# Patient Record
Sex: Female | Born: 1965 | Race: White | Hispanic: No | Marital: Married | Smoking: Former smoker
Health system: Southern US, Community
[De-identification: ages and names within clinical notes are randomized; demographics above are authoritative.]

## PROBLEM LIST (undated history)

## (undated) DIAGNOSIS — D649 Anemia, unspecified: Secondary | ICD-10-CM

## (undated) DIAGNOSIS — M549 Dorsalgia, unspecified: Secondary | ICD-10-CM

## (undated) DIAGNOSIS — F419 Anxiety disorder, unspecified: Secondary | ICD-10-CM

## (undated) DIAGNOSIS — R112 Nausea with vomiting, unspecified: Secondary | ICD-10-CM

## (undated) DIAGNOSIS — E049 Nontoxic goiter, unspecified: Secondary | ICD-10-CM

## (undated) DIAGNOSIS — E785 Hyperlipidemia, unspecified: Secondary | ICD-10-CM

## (undated) DIAGNOSIS — Z8632 Personal history of gestational diabetes: Secondary | ICD-10-CM

## (undated) DIAGNOSIS — E282 Polycystic ovarian syndrome: Secondary | ICD-10-CM

## (undated) DIAGNOSIS — C50919 Malignant neoplasm of unspecified site of unspecified female breast: Secondary | ICD-10-CM

## (undated) DIAGNOSIS — K219 Gastro-esophageal reflux disease without esophagitis: Secondary | ICD-10-CM

## (undated) DIAGNOSIS — Z9889 Other specified postprocedural states: Secondary | ICD-10-CM

## (undated) DIAGNOSIS — I4711 Inappropriate sinus tachycardia, so stated: Secondary | ICD-10-CM

## (undated) DIAGNOSIS — M61479 Other calcification of muscle, unspecified ankle and foot: Secondary | ICD-10-CM

## (undated) DIAGNOSIS — K900 Celiac disease: Secondary | ICD-10-CM

## (undated) DIAGNOSIS — C4490 Unspecified malignant neoplasm of skin, unspecified: Secondary | ICD-10-CM

## (undated) DIAGNOSIS — K209 Esophagitis, unspecified without bleeding: Secondary | ICD-10-CM

## (undated) DIAGNOSIS — R232 Flushing: Secondary | ICD-10-CM

## (undated) DIAGNOSIS — R0602 Shortness of breath: Secondary | ICD-10-CM

## (undated) DIAGNOSIS — M47819 Spondylosis without myelopathy or radiculopathy, site unspecified: Secondary | ICD-10-CM

## (undated) DIAGNOSIS — E538 Deficiency of other specified B group vitamins: Secondary | ICD-10-CM

## (undated) DIAGNOSIS — E559 Vitamin D deficiency, unspecified: Secondary | ICD-10-CM

## (undated) DIAGNOSIS — C801 Malignant (primary) neoplasm, unspecified: Secondary | ICD-10-CM

## (undated) DIAGNOSIS — R000 Tachycardia, unspecified: Secondary | ICD-10-CM

## (undated) DIAGNOSIS — T8859XA Other complications of anesthesia, initial encounter: Secondary | ICD-10-CM

## (undated) DIAGNOSIS — N970 Female infertility associated with anovulation: Secondary | ICD-10-CM

## (undated) DIAGNOSIS — Z148 Genetic carrier of other disease: Secondary | ICD-10-CM

## (undated) DIAGNOSIS — M2550 Pain in unspecified joint: Secondary | ICD-10-CM

## (undated) DIAGNOSIS — R1310 Dysphagia, unspecified: Secondary | ICD-10-CM

## (undated) DIAGNOSIS — I2510 Atherosclerotic heart disease of native coronary artery without angina pectoris: Secondary | ICD-10-CM

## (undated) DIAGNOSIS — R5383 Other fatigue: Secondary | ICD-10-CM

## (undated) DIAGNOSIS — T4145XA Adverse effect of unspecified anesthetic, initial encounter: Secondary | ICD-10-CM

## (undated) HISTORY — DX: Other calcification of muscle, unspecified ankle and foot: M61.479

## (undated) HISTORY — DX: Malignant neoplasm of unspecified site of unspecified female breast: C50.919

## (undated) HISTORY — DX: Other fatigue: R53.83

## (undated) HISTORY — DX: Genetic carrier of other disease: Z14.8

## (undated) HISTORY — DX: Vitamin D deficiency, unspecified: E55.9

## (undated) HISTORY — DX: Personal history of gestational diabetes: Z86.32

## (undated) HISTORY — DX: Esophagitis, unspecified without bleeding: K20.90

## (undated) HISTORY — DX: Gastro-esophageal reflux disease without esophagitis: K21.9

## (undated) HISTORY — DX: Malignant (primary) neoplasm, unspecified: C80.1

## (undated) HISTORY — DX: Hyperlipidemia, unspecified: E78.5

## (undated) HISTORY — DX: Nontoxic goiter, unspecified: E04.9

## (undated) HISTORY — DX: Unspecified malignant neoplasm of skin, unspecified: C44.90

## (undated) HISTORY — DX: Inappropriate sinus tachycardia, so stated: I47.11

## (undated) HISTORY — DX: Dorsalgia, unspecified: M54.9

## (undated) HISTORY — DX: Flushing: R23.2

## (undated) HISTORY — DX: Anemia, unspecified: D64.9

## (undated) HISTORY — DX: Anxiety disorder, unspecified: F41.9

## (undated) HISTORY — DX: Celiac disease: K90.0

## (undated) HISTORY — PX: MASTECTOMY: SHX3

## (undated) HISTORY — DX: Dysphagia, unspecified: R13.10

## (undated) HISTORY — DX: Polycystic ovarian syndrome: E28.2

## (undated) HISTORY — DX: Female infertility associated with anovulation: N97.0

## (undated) HISTORY — DX: Deficiency of other specified B group vitamins: E53.8

## (undated) HISTORY — DX: Pain in unspecified joint: M25.50

## (undated) HISTORY — DX: Atherosclerotic heart disease of native coronary artery without angina pectoris: I25.10

## (undated) HISTORY — DX: Shortness of breath: R06.02

## (undated) HISTORY — DX: Tachycardia, unspecified: R00.0

## (undated) HISTORY — PX: ABDOMINAL HYSTERECTOMY: SHX81

---

## 2006-11-03 ENCOUNTER — Encounter: Admission: RE | Admit: 2006-11-03 | Discharge: 2006-11-03 | Payer: Self-pay | Admitting: Endocrinology

## 2007-10-19 ENCOUNTER — Encounter: Admission: RE | Admit: 2007-10-19 | Discharge: 2007-10-19 | Payer: Self-pay | Admitting: Obstetrics and Gynecology

## 2008-11-27 ENCOUNTER — Encounter: Admission: RE | Admit: 2008-11-27 | Discharge: 2008-11-27 | Payer: Self-pay | Admitting: Obstetrics and Gynecology

## 2008-12-19 ENCOUNTER — Encounter: Admission: RE | Admit: 2008-12-19 | Discharge: 2008-12-19 | Payer: Self-pay | Admitting: Endocrinology

## 2008-12-19 IMAGING — US US SOFT TISSUE HEAD/NECK
1 series · 14 of 25 positions shown · non-contrast
Comparison: Thyroid ultrasound [DATE].

CLINICAL DATA: Follow-up goiter.

THYROID ULTRASOUND
TECHNIQUE: Ultrasound examination of the thyroid gland and
adjacent soft tissues was performed.

[Series 1: us soft tissue head/neck · 0.07mm/px · 14 of 58 slices shown]
[im 1/58]
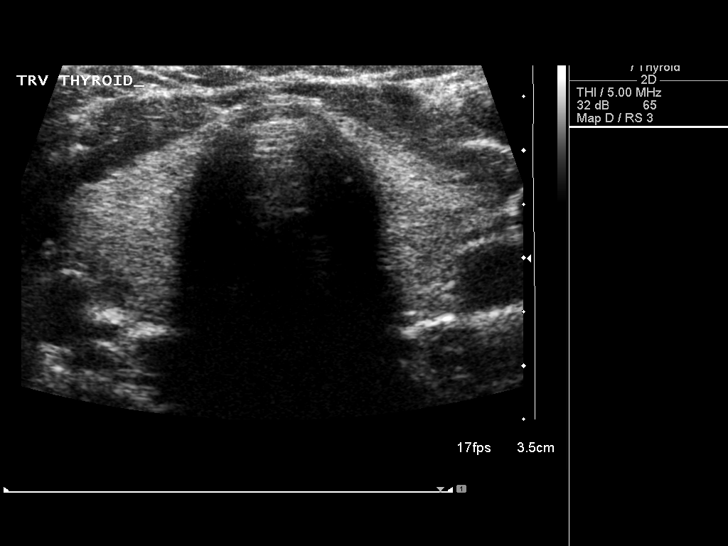
[im 5/58]
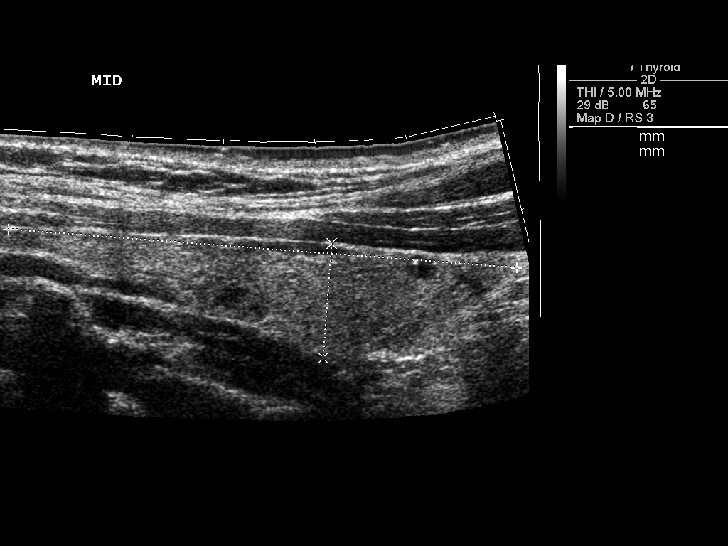
[im 10/58]
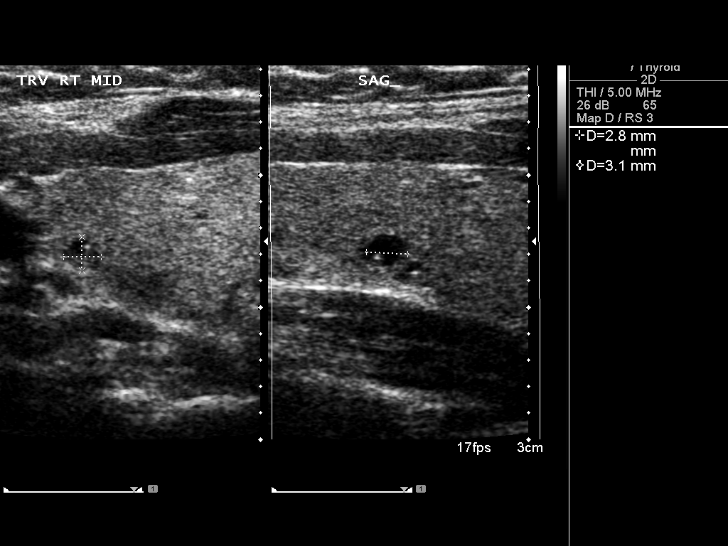
[im 15/58]
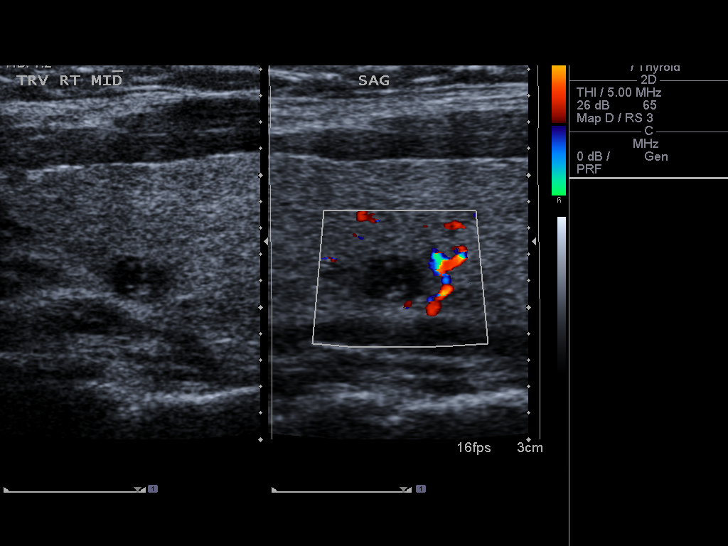
[im 20/58]
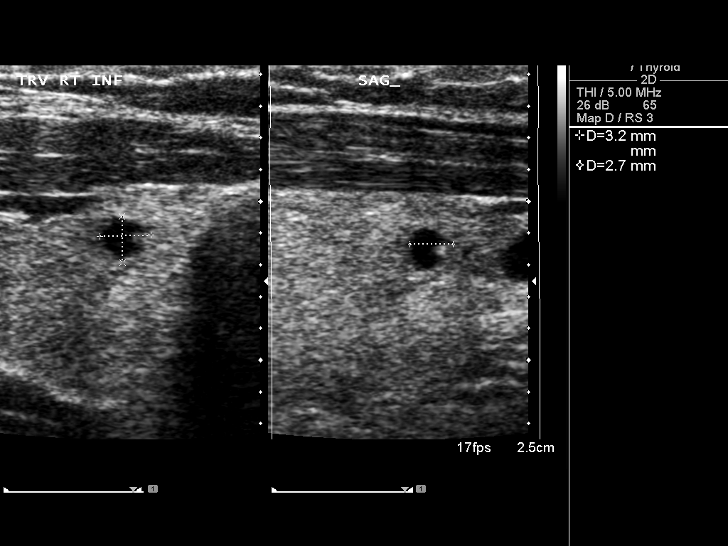
[im 22/58]
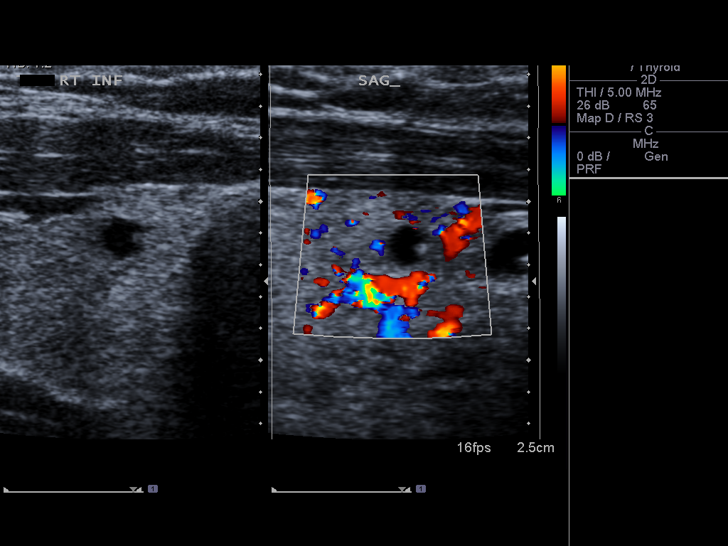
[im 27/58]
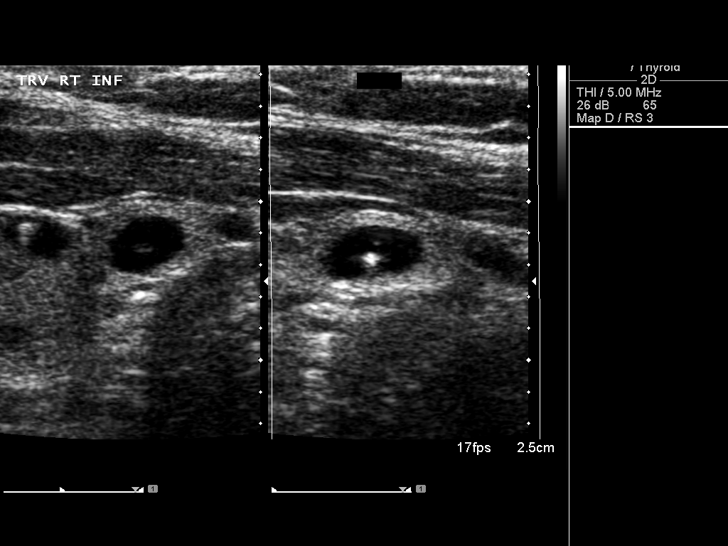
[im 31/58]
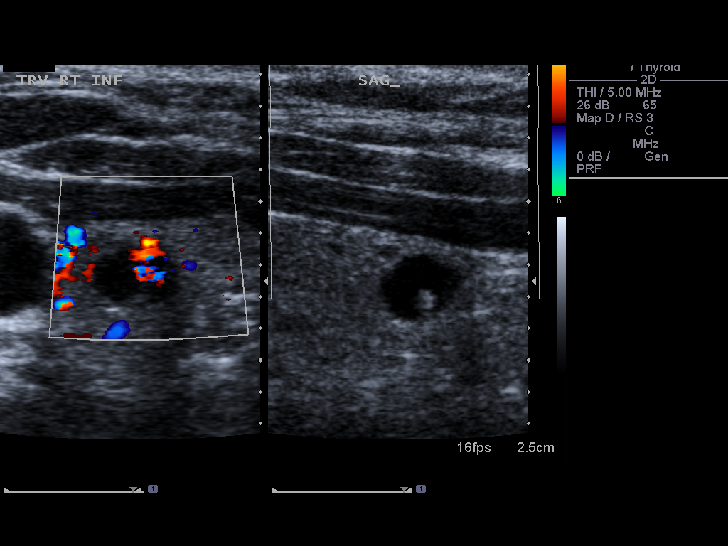
[im 36/58]
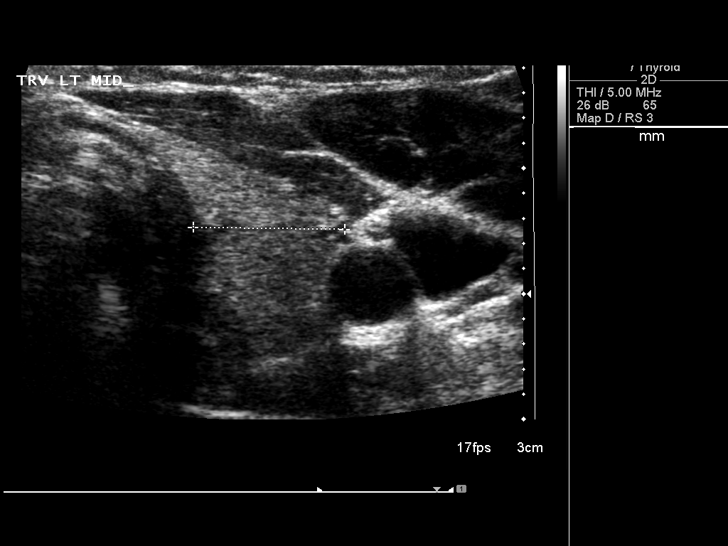
[im 39/58]
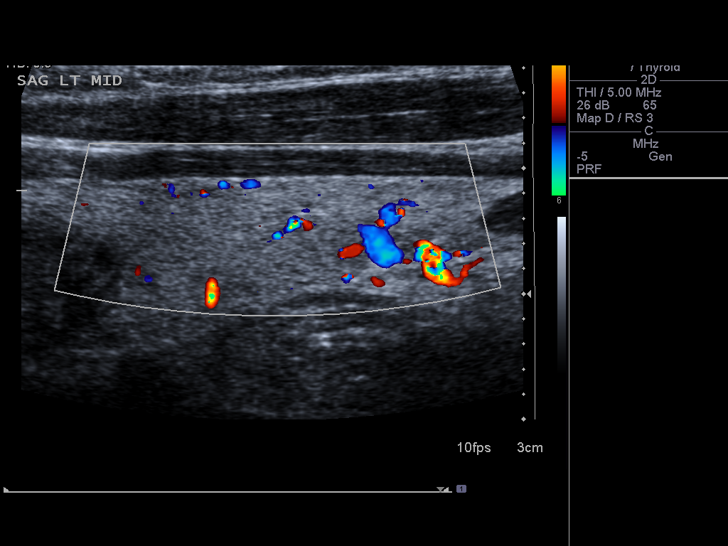
[im 43/58]
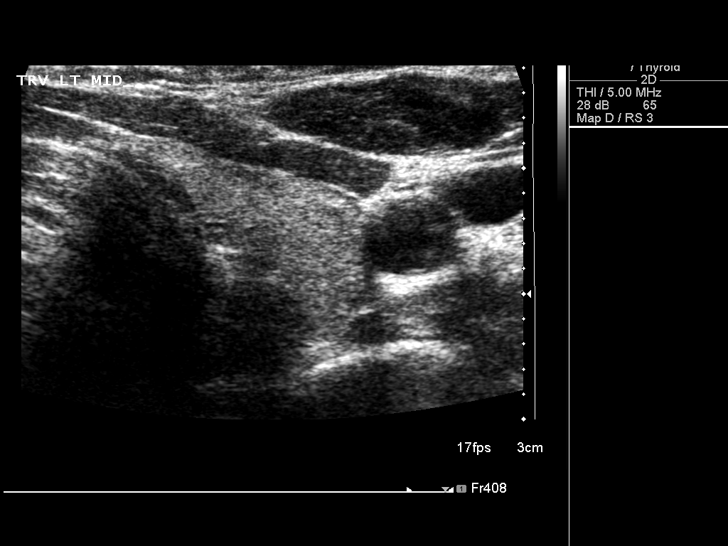
[im 48/58]
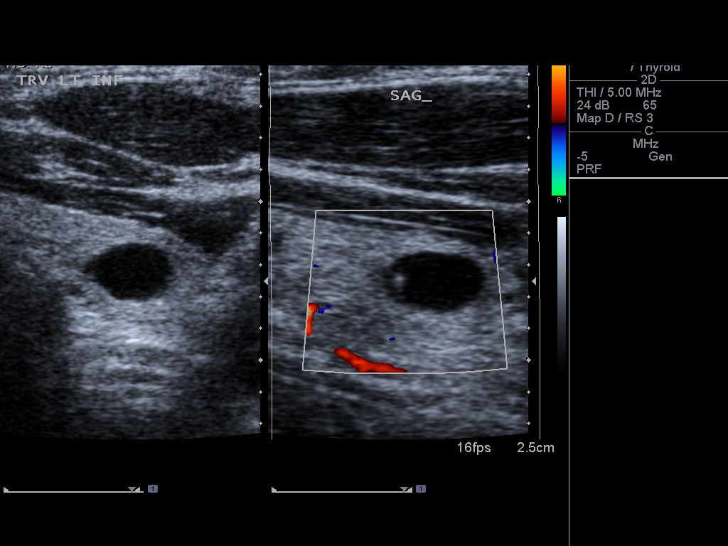
[im 53/58]
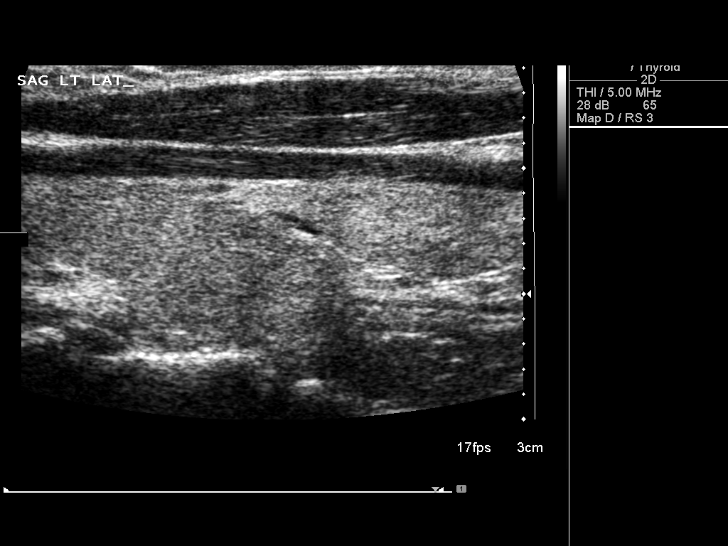
[im 58/58]
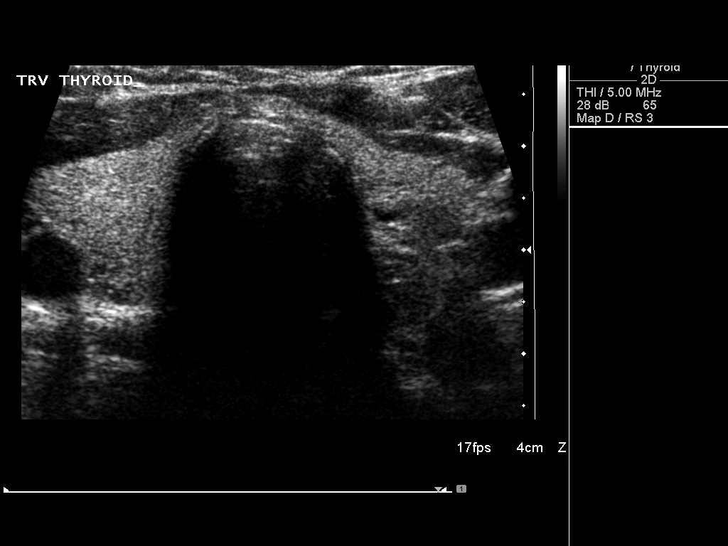

[14 of 25 positions shown; findings below may reference images not displayed]

FINDINGS: The thyroid echotexture is fairly homogeneous
bilaterally.  Overall dimensions of the right lobe are 5.6 x 1.3 x
1.5 cm, and the left lobe 5.2 x 1.2 x 1.2 cm.  The isthmus measures
2.6 mm in thickness.

There are innumerable primarily cystic lesions bilaterally.  Some
of these are minimally complex, but no solid lesions are
identified.  The largest lesion on the right is located inferiorly
and measures 6 mm in maximal diameter.  The largest lesion on the
left is located inferiorly and measures 7 mm in maximal diameter.
IMPRESSION: Generally stable primarily cystic lesions in both thyroid lobes.
No suspicious solid nodules are identified.  Overall gland size is
similar to the prior study.

## 2010-09-07 ENCOUNTER — Other Ambulatory Visit: Payer: Self-pay | Admitting: Family Medicine

## 2010-09-07 DIAGNOSIS — IMO0002 Reserved for concepts with insufficient information to code with codable children: Secondary | ICD-10-CM

## 2010-09-10 ENCOUNTER — Ambulatory Visit
Admission: RE | Admit: 2010-09-10 | Discharge: 2010-09-10 | Disposition: A | Discharge status: Home / Self Care | Payer: BC Managed Care – PPO | Source: Ambulatory Visit | Attending: Family Medicine | Admitting: Family Medicine

## 2010-09-10 DIAGNOSIS — IMO0002 Reserved for concepts with insufficient information to code with codable children: Secondary | ICD-10-CM

## 2011-08-31 ENCOUNTER — Other Ambulatory Visit: Payer: Self-pay | Admitting: Obstetrics and Gynecology

## 2011-08-31 DIAGNOSIS — R928 Other abnormal and inconclusive findings on diagnostic imaging of breast: Secondary | ICD-10-CM

## 2011-09-05 ENCOUNTER — Ambulatory Visit
Admission: RE | Admit: 2011-09-05 | Discharge: 2011-09-05 | Disposition: A | Discharge status: Home / Self Care | Payer: BC Managed Care – PPO | Source: Ambulatory Visit | Attending: Obstetrics and Gynecology | Admitting: Obstetrics and Gynecology

## 2011-09-05 ENCOUNTER — Other Ambulatory Visit: Payer: Self-pay | Admitting: Obstetrics and Gynecology

## 2011-09-05 DIAGNOSIS — R921 Mammographic calcification found on diagnostic imaging of breast: Secondary | ICD-10-CM

## 2011-09-05 DIAGNOSIS — R928 Other abnormal and inconclusive findings on diagnostic imaging of breast: Secondary | ICD-10-CM

## 2011-09-13 ENCOUNTER — Other Ambulatory Visit: Payer: Self-pay | Admitting: Obstetrics and Gynecology

## 2011-09-13 ENCOUNTER — Ambulatory Visit
Admission: RE | Admit: 2011-09-13 | Discharge: 2011-09-13 | Disposition: A | Discharge status: Home / Self Care | Payer: BC Managed Care – PPO | Source: Ambulatory Visit | Attending: Obstetrics and Gynecology | Admitting: Obstetrics and Gynecology

## 2011-09-13 DIAGNOSIS — R928 Other abnormal and inconclusive findings on diagnostic imaging of breast: Secondary | ICD-10-CM

## 2011-09-13 DIAGNOSIS — R921 Mammographic calcification found on diagnostic imaging of breast: Secondary | ICD-10-CM

## 2011-09-14 ENCOUNTER — Ambulatory Visit
Admission: RE | Admit: 2011-09-14 | Discharge: 2011-09-14 | Disposition: A | Discharge status: Home / Self Care | Payer: BC Managed Care – PPO | Source: Ambulatory Visit | Attending: Obstetrics and Gynecology | Admitting: Obstetrics and Gynecology

## 2011-09-14 ENCOUNTER — Other Ambulatory Visit: Payer: Self-pay | Admitting: Obstetrics and Gynecology

## 2011-09-14 DIAGNOSIS — R928 Other abnormal and inconclusive findings on diagnostic imaging of breast: Secondary | ICD-10-CM

## 2011-09-14 DIAGNOSIS — C50912 Malignant neoplasm of unspecified site of left female breast: Secondary | ICD-10-CM

## 2011-09-16 ENCOUNTER — Other Ambulatory Visit: Payer: Self-pay | Admitting: *Deleted

## 2011-09-16 ENCOUNTER — Telehealth: Payer: Self-pay | Admitting: *Deleted

## 2011-09-16 DIAGNOSIS — C50412 Malignant neoplasm of upper-outer quadrant of left female breast: Secondary | ICD-10-CM | POA: Insufficient documentation

## 2011-09-16 DIAGNOSIS — Z853 Personal history of malignant neoplasm of breast: Secondary | ICD-10-CM | POA: Insufficient documentation

## 2011-09-16 DIAGNOSIS — C50419 Malignant neoplasm of upper-outer quadrant of unspecified female breast: Secondary | ICD-10-CM

## 2011-09-16 NOTE — Telephone Encounter (Signed)
Confirmed [facility] for 10/05/11 at 1200 .  Instructions and contact information given.

## 2011-09-19 ENCOUNTER — Other Ambulatory Visit: Payer: BC Managed Care – PPO

## 2011-09-20 ENCOUNTER — Other Ambulatory Visit: Payer: Self-pay | Admitting: Obstetrics and Gynecology

## 2011-09-20 ENCOUNTER — Ambulatory Visit
Admission: RE | Admit: 2011-09-20 | Discharge: 2011-09-20 | Disposition: A | Discharge status: Home / Self Care | Payer: BC Managed Care – PPO | Source: Ambulatory Visit | Attending: Obstetrics and Gynecology | Admitting: Obstetrics and Gynecology

## 2011-09-20 DIAGNOSIS — R928 Other abnormal and inconclusive findings on diagnostic imaging of breast: Secondary | ICD-10-CM

## 2011-09-20 DIAGNOSIS — C50912 Malignant neoplasm of unspecified site of left female breast: Secondary | ICD-10-CM

## 2011-09-20 MED ORDER — GADOBENATE DIMEGLUMINE 529 MG/ML IV SOLN
15.0000 mL | Freq: Once | INTRAVENOUS | Status: AC | PRN
  Administered 2011-09-20: 15 mL via INTRAVENOUS

## 2011-09-21 ENCOUNTER — Other Ambulatory Visit: Payer: Self-pay | Admitting: Obstetrics and Gynecology

## 2011-09-21 DIAGNOSIS — R928 Other abnormal and inconclusive findings on diagnostic imaging of breast: Secondary | ICD-10-CM

## 2011-09-29 ENCOUNTER — Ambulatory Visit
Admission: RE | Admit: 2011-09-29 | Discharge: 2011-09-29 | Disposition: A | Discharge status: Home / Self Care | Payer: BC Managed Care – PPO | Source: Ambulatory Visit | Attending: Obstetrics and Gynecology | Admitting: Obstetrics and Gynecology

## 2011-09-29 ENCOUNTER — Other Ambulatory Visit: Payer: Self-pay | Admitting: Obstetrics and Gynecology

## 2011-09-29 DIAGNOSIS — R928 Other abnormal and inconclusive findings on diagnostic imaging of breast: Secondary | ICD-10-CM

## 2011-10-04 ENCOUNTER — Ambulatory Visit
Admission: RE | Admit: 2011-10-04 | Discharge: 2011-10-04 | Disposition: A | Discharge status: Home / Self Care | Payer: BC Managed Care – PPO | Source: Ambulatory Visit | Attending: Obstetrics and Gynecology | Admitting: Obstetrics and Gynecology

## 2011-10-04 DIAGNOSIS — R928 Other abnormal and inconclusive findings on diagnostic imaging of breast: Secondary | ICD-10-CM

## 2011-10-04 MED ORDER — GADOBENATE DIMEGLUMINE 529 MG/ML IV SOLN
14.0000 mL | Freq: Once | INTRAVENOUS | Status: AC | PRN
  Administered 2011-10-04: 14 mL via INTRAVENOUS

## 2011-10-05 ENCOUNTER — Encounter (INDEPENDENT_AMBULATORY_CARE_PROVIDER_SITE_OTHER): Payer: Self-pay | Admitting: Surgery

## 2011-10-05 ENCOUNTER — Telehealth: Payer: Self-pay | Admitting: *Deleted

## 2011-10-05 ENCOUNTER — Ambulatory Visit (HOSPITAL_BASED_OUTPATIENT_CLINIC_OR_DEPARTMENT_OTHER): Payer: BC Managed Care – PPO | Admitting: Surgery

## 2011-10-05 ENCOUNTER — Other Ambulatory Visit (INDEPENDENT_AMBULATORY_CARE_PROVIDER_SITE_OTHER): Payer: Self-pay | Admitting: Surgery

## 2011-10-05 ENCOUNTER — Other Ambulatory Visit (HOSPITAL_BASED_OUTPATIENT_CLINIC_OR_DEPARTMENT_OTHER): Payer: BC Managed Care – PPO | Admitting: Lab

## 2011-10-05 ENCOUNTER — Ambulatory Visit
Admission: RE | Admit: 2011-10-05 | Discharge: 2011-10-05 | Disposition: A | Discharge status: Home / Self Care | Payer: BC Managed Care – PPO | Source: Ambulatory Visit | Attending: Radiation Oncology | Admitting: Radiation Oncology

## 2011-10-05 ENCOUNTER — Encounter: Payer: Self-pay | Admitting: *Deleted

## 2011-10-05 ENCOUNTER — Ambulatory Visit: Payer: BC Managed Care – PPO | Attending: Surgery | Admitting: Physical Therapy

## 2011-10-05 ENCOUNTER — Encounter: Payer: Self-pay | Admitting: Radiation Oncology

## 2011-10-05 ENCOUNTER — Telehealth: Payer: Self-pay | Admitting: Oncology

## 2011-10-05 ENCOUNTER — Ambulatory Visit (HOSPITAL_BASED_OUTPATIENT_CLINIC_OR_DEPARTMENT_OTHER): Payer: BC Managed Care – PPO | Admitting: Oncology

## 2011-10-05 ENCOUNTER — Ambulatory Visit: Payer: BC Managed Care – PPO

## 2011-10-05 VITALS — BP 122/87 | HR 105 | Temp 98.9°F | Resp 16 | Ht 62.0 in | Wt 156.0 lb

## 2011-10-05 VITALS — BP 122/87 | HR 105 | Temp 98.9°F | Resp 20 | Ht 62.0 in | Wt 155.6 lb

## 2011-10-05 DIAGNOSIS — M25519 Pain in unspecified shoulder: Secondary | ICD-10-CM | POA: Insufficient documentation

## 2011-10-05 DIAGNOSIS — M25619 Stiffness of unspecified shoulder, not elsewhere classified: Secondary | ICD-10-CM | POA: Insufficient documentation

## 2011-10-05 DIAGNOSIS — R5381 Other malaise: Secondary | ICD-10-CM

## 2011-10-05 DIAGNOSIS — C50419 Malignant neoplasm of upper-outer quadrant of unspecified female breast: Secondary | ICD-10-CM

## 2011-10-05 DIAGNOSIS — C50919 Malignant neoplasm of unspecified site of unspecified female breast: Secondary | ICD-10-CM | POA: Insufficient documentation

## 2011-10-05 DIAGNOSIS — Z170 Estrogen receptor positive status [ER+]: Secondary | ICD-10-CM

## 2011-10-05 DIAGNOSIS — C50912 Malignant neoplasm of unspecified site of left female breast: Secondary | ICD-10-CM

## 2011-10-05 DIAGNOSIS — D0590 Unspecified type of carcinoma in situ of unspecified breast: Secondary | ICD-10-CM

## 2011-10-05 DIAGNOSIS — R61 Generalized hyperhidrosis: Secondary | ICD-10-CM

## 2011-10-05 DIAGNOSIS — IMO0001 Reserved for inherently not codable concepts without codable children: Secondary | ICD-10-CM | POA: Insufficient documentation

## 2011-10-05 LAB — CBC WITH DIFFERENTIAL/PLATELET
Basophils Absolute: 0.1 10*3/uL (ref 0.0–0.1)
Eosinophils Absolute: 0.1 10*3/uL (ref 0.0–0.5)
HCT: 41.5 % (ref 34.8–46.6)
HGB: 13.7 g/dL (ref 11.6–15.9)
MCH: 31 pg (ref 25.1–34.0)
NEUT#: 6.2 10*3/uL (ref 1.5–6.5)
NEUT%: 70.4 % (ref 38.4–76.8)
RDW: 12.8 % (ref 11.2–14.5)
lymph#: 2 10*3/uL (ref 0.9–3.3)

## 2011-10-05 LAB — COMPREHENSIVE METABOLIC PANEL (CC13)
Albumin: 4.2 g/dL (ref 3.5–5.0)
BUN: 11 mg/dL (ref 7.0–26.0)
CO2: 24 mEq/L (ref 22–29)
Calcium: 9.9 mg/dL (ref 8.4–10.4)
Chloride: 103 mEq/L (ref 98–107)
Creatinine: 0.8 mg/dL (ref 0.6–1.1)
Glucose: 107 mg/dl — ABNORMAL HIGH (ref 70–99)
Potassium: 3.9 mEq/L (ref 3.5–5.1)

## 2011-10-05 NOTE — Progress Notes (Signed)
Radiation Oncology         [phone]) [phone] ________________________________  Initial outpatient Consultation  Name: Samantha Andersen MRN: [id number]  Date: 10/05/2011  DOB: [birth year]  ZH:YQMV,HQION C, MD  Streck, Reola Mosher, MD   REFERRING PHYSICIAN: Currie Paris, MD  DIAGNOSIS: The encounter diagnosis was Cancer of upper-outer quadrant of female breast.  HISTORY OF PRESENT ILLNESS::Samantha Andersen is a 46 y.o. female who is seen as part of the multidisciplinary breast clinic. Earlier this year on routine screening mammography the patient was noted to have some suspicious calcifications  within the upper outer aspect of the left breast. The patient proceeded to undergo stereotactic guided biopsy of these calcifications. Of note is the area of calcifications extended over  3.5 x 5 x 6.5 cm. 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 The tumor was felt to be low to intermediate grade. Tumor was estrogen receptor positive at 99% and progesterone receptor positive at 69%. Patient proceeded to undergo MRI of the breast which showed that 2 adjacent oval homogeneously enhancing masses in the retroareolar area of the right breast. A prominent lymph node noted in the left axillary region measuring 1.2 x 1.0 x 1.1 cm.. Was recommended biopsy of the right breast and the left axillary area be performed. In addition it was recommended stereotactic biopsy of the extent of calcifications within the upper-outer aspect of the breast area be performed if the patient were to proceed with breast conserving surgery.  A biopsy of the right breast area revealed fibrocystic changes with usual ductal hyperplasia. There was no evidence of malignancy. A needle core biopsy of the left axilla revealed a benign lymph node. With these findings the patient is now seen for evaluation and the management.   PREVIOUS RADIATION THERAPY: No  PAST MEDICAL  HISTORY:  has a past medical history of Breast cancer; PCOS (polycystic ovarian syndrome); Anemia; Hyperlipidemia; Anxiety; Hot flashes; Joint pain; and Diabetes mellitus.    PAST SURGICAL HISTORY: Past Surgical History  Procedure Date  . Abdominal hysterectomy   . Cesarean section     x2    FAMILY HISTORY: family history includes Lung cancer in her maternal grandfather.  SOCIAL HISTORY:  reports that she quit smoking about 26 years ago. She does not have any smokeless tobacco history on file. She reports that she does not drink alcohol or use illicit drugs.  ALLERGIES: Doxycycline; Tetracyclines & related; and Ciprofloxacin  MEDICATIONS:  Current Outpatient Prescriptions  Medication Sig Dispense Refill  . acetaminophen (TYLENOL) 500 MG tablet Take 500 mg by mouth every 6 (six) hours as needed.      . diazepam (VALIUM) 2 MG tablet Take 2 mg by mouth every 6 (six) hours as needed.      . metFORMIN (GLUMETZA) 500 MG (MOD) 24 hr tablet Take 500 mg by mouth 4 (four) times daily.      . vitamin B-12 (CYANOCOBALAMIN) 1000 MCG tablet Take 1,000 mcg by mouth daily.        REVIEW OF SYSTEMS:  A 15 point review of systems is documented in the electronic medical record. This was obtained by the nursing staff. However, I reviewed this with the patient to discuss relevant findings and make appropriate changes.  Prior to biopsy the patient denied any pain in the breast area, nipple discharge or bleeding. She denies any new bony pain headaches dizziness or blurred vision.   PHYSICAL EXAM: In general this is a  very pleasant energetic 46 year old female in no acute distress. She is accompanied by her husband on evaluation today. Examination of the neck and supraclavicular region reveals no evidence of adenopathy. The axillary areas are free of adenopathy. Examination of the lungs reveals them to be clear. The heart has a regular rhythm and rate. Examination of the right breast reveals no mass or nipple  discharge. Examination left breast reveals a small biopsy site in the upper outer aspect of the breast. There is thickening noted in the upper outer quadrant of the left breast but no dominant masses appreciated breast nipple discharge or bleeding.  LABORATORY DATA:  Lab Results  Component Value Date   WBC 8.8 10/05/2011   HGB 13.7 10/05/2011   HCT 41.5 10/05/2011   MCV 93.9 10/05/2011   PLT 292 10/05/2011   Lab Results  Component Value Date   NA 139 10/05/2011   K 3.9 10/05/2011   CL 103 10/05/2011   CO2 24 10/05/2011   Lab Results  Component Value Date   ALT 13 10/05/2011   AST 16 10/05/2011   ALKPHOS 45 10/05/2011   BILITOT 0.30 10/05/2011     RADIOGRAPHY: US Breast Bilateral  09/29/2011  *RADIOLOGY REPORT*  Clinical Data:  Known left breast malignancy.  MRI follow-up for prominent left axillary lymph node and two nodules within the retroareolar region of the right breast.  BILATERAL BREAST ULTRASOUND  Comparison:  Mammogram 09/13/2011 and earlier and MRI 09/21/2011  On physical exam, I palpate no abnormality in the central portion of the right breast.  Findings: Ultrasound is performed, showing numerous small hypoechoic and anechoic rounded structures in the central aspect of the right breast, most consistent with fibrocystic changes.  No definite solid mass identified to correlate with the abnormalities identified on MRI.  Within the left axilla, a single slightly prominent lymph node is identified with slightly thickened cortex is identified. Biopsy is recommended to exclude malignancy.  IMPRESSION:  1.  No sonographic correlate for the 2 adjacent enhancing nodules in the central portion of the right breast seen on MRI. 2.  Slightly prominent left axillary lymph node.  RECOMMENDATION:  1.  MR guided core biopsy of the right breast. 2.  Ultrasound guided core biopsy of left axillary lymph node.  BI-RADS CATEGORY 4:  Suspicious abnormality - biopsy should be considered.   Original Report  Authenticated By: Patterson Hammersmith, M.D.    Mr Breast Bilateral W Wo Contrast  09/21/2011  **ADDENDUM** CREATED: 09/21/2011 11:56:25  Please note that the calcifications within the upper - outer left breast, biopsy proven to be DCIS, encompasses an area measuring 3.5 x 5 x 6.5 cm.  Addended by:  Elpidio Eric. Si Gaul, M.D. on 09/21/2011 11:56:25.  **END ADDENDUM** SIGNED BY: Tinnie Gens T. Si Gaul, M.D.   09/20/2011  *RADIOLOGY REPORT*  Clinical Data: New diagnosis DCIS, left breast.  BUN and creatinine were obtained on site at [facility] at [address] Results:  BUN 11 mg/dL,  Creatinine 0.8 mg/dL.  BILATERAL BREAST MRI WITH AND WITHOUT CONTRAST  Technique: Multiplanar, multisequence MR images of both breasts were obtained prior to and following the intravenous administration of 15ml of Multihance.  Three dimensional images were evaluated at the independent DynaCad workstation.  Comparison:  None.  Findings: Moderate background parenchymal enhancement seen bilaterally and there foci of nonspecific enhancement seen. Postbiopsy changes are seen in the upper-outer quadrant of the left breast, posteriorly with  biopsy clip artifact.  There is no significant residual enhancement  or mass.  No suspicious enhancement or mass is seen in the left breast. A prominent lymph node with an attenuated fatty hilum is imaged in the left axilla measuring 1.2 x 1.0 x 1.1 cm.  In the right breast, two adjacent oval homogeneously enhancing masses with circumscribed margins and plateau kinetics are imaged in the retroareolar region of the right breast, posteriorly measuring 0.5 and 0.3 cm encompassing in the area of 1.2 cm in total.  No other suspicious mass or enhancement seen in the right breast. There is no axillary or internal mammary adenopathy seen on the right.  IMPRESSION:  1.  Known malignancy, left breast.  If breast conservation therapy is desired, consideration of stereotactic biopsy of the left breast is recommended to  evaluate for extent of disease.  Also, sonographic evaluation is recommended of the left axillary lymph node. 2.  Right breast masses for which a second look ultrasound is recommended.  If these are not seen sonographically, an MRI guided biopsy is recommended.  RECOMMENDATION: Second look ultrasound for left axillary lymph node and right breast masses.  Possible stereotactic biopsy for evaluation of extent disease, left breast.  THREE-DIMENSIONAL MR IMAGE RENDERING ON INDEPENDENT WORKSTATION:  Three-dimensional MR images were rendered by post-processing of the original MR data on an independent workstation.  The three- dimensional MR images were interpreted, and findings were reported in the accompanying complete MRI report for this study.  BI-RADS CATEGORY 6:  Known biopsy-proven malignancy - appropriate action should be taken.   Original Report Authenticated By: Rosendo Gros, M.D.    Mr Biopsy/wire Localization  10/04/2011  *RADIOLOGY REPORT*  Clinical Data:  The patient was recently diagnosed with ductal carcinoma in situ in the left breast.  Two adjacent foci of enhancement are identified within the medial posterior portion of the right breast.  The patient returns for biopsy with MR guidance.  MRI GUIDED VACUUM ASSISTED BIOPSY OF THE RIGHT BREAST WITHOUT AND WITH CONTRAST  Comparison: Previous exams.  Technique: Multiplanar, multisequence MR images of the right breast were obtained prior to and following the intravenous administration of 14 ml of Mulithance.  I met with the patient, and we discussed the procedure of MRI guided biopsy, including risks, benefits, and alternatives. Specifically, we discussed the risks of infection, bleeding, tissue injury, clip migration, and inadequate sampling.  Informed, written consent was given.  Using sterile technique, 2% Lidocaine, MRI guidance, and a 9 gauge vacuum assisted device, biopsy was performed of two adjacent nodules in the central medial posterior portion  of the right breast, using a lateral approach.  At the conclusion of the procedure, a  tissue marker clip was deployed into the biopsy cavity.  IMPRESSION: MRI guided biopsy of posterior medial right breast. No apparent complications.  THREE-DIMENSIONAL MR IMAGE RENDERING ON INDEPENDENT WORKSTATION:  Three-dimensional MR images were rendered by post-processing of the original MR data on an independent workstation.  The three- dimensional MR images were interpreted, and findings were reported in the accompanying complete MRI report for this study.   Original Report Authenticated By: Patterson Hammersmith, M.D.    [place] Core Biopsy  09/30/2011  **ADDENDUM** CREATED: 09/30/2011 15:19:17  Pathology shows benign lymph node tissue.  No metastatic carcinoma identified.  The pathology correlates well with the imaging appearance.  I spoke with the patient by telephone.  She reports doing well after the biopsy.  She reports no significant bruising or bleeding at the biopsy site.  The patient is scheduled for MR guided  core biopsy of the right breast on 10/04/2011.  **END ADDENDUM** SIGNED BY: Blair Hailey. Manson Passey, M.D.   09/29/2011  *RADIOLOGY REPORT*  Clinical Data:  Known left breast cancer.  Prominent left axillary node lymph node identified by MRI and ultrasound.  ULTRASOUND GUIDED CORE BIOPSY OF THE LEFT AXILLA  Comparison: 09/29/2011 and earlier  I met with the patient and we discussed the procedure of ultrasound- guided biopsy, including benefits and alternatives.  We discussed the high likelihood of a successful procedure. We discussed the risks of the procedure, including infection, bleeding, tissue injury, clip migration, and inadequate sampling.  Informed written consent was given.  Using sterile technique 2% lidocaine, ultrasound guidance and a 14 gauge automated biopsy device, biopsy was performed of left axillary lymph node using a lateral approach and coaxial technique.  IMPRESSION: Ultrasound guided biopsy of left  axillary lymph node.  No apparent complications.  Original Report Authenticated By: Patterson Hammersmith, M.D.    Mm Breast Stereo Biopsy Left  09/21/2011  **ADDENDUM** CREATED: 09/21/2011 11:57:39  Please note that the calcifications within the upper - outer left breast, biopsy proven to be DCIS, encompasses an area measuring 3.5 x 5 x 6.5 cm.  Addended by:  Elpidio Eric. Si Gaul, M.D. on 09/21/2011 11:57:39.  **END ADDENDUM** SIGNED BY: Tinnie Gens T. Si Gaul, M.D.   09/13/2011  *RADIOLOGY REPORT*  Clinical Data:  Left breast calcifications.  STEREOTACTIC-GUIDED VACUUM ASSISTED BIOPSY OF THE LEFT BREAST AND SPECIMEN RADIOGRAPH  Comparison: Previous exams.  I met with the patient and we discussed the procedure of stereotactic-guided biopsy, including benefits and alternatives. We discussed the high likelihood of a successful procedure. We discussed the risks of the procedure, including infection, bleeding, tissue injury, clip migration, and inadequate sampling. Informed, written consent was given.  Using sterile technique, 2% lidocaine, stereotactic guidance, and a 9 gauge vacuum assisted device, biopsy was performed of the cluster of microcalcifications located within the upper-outer quadrant of the left breast using a  lateral approach.  Specimen radiograph was performed, showing representative calcifications within the specimen.  Specimens with calcifications are identified for pathology.  At the conclusion of the procedure, an hourglass shaped tissue marker clip was deployed into the biopsy cavity.  Follow-up 2-view mammogram confirmed clip to be in appropriate position.  IMPRESSION: Stereotactic-guided biopsy of left breast calcifications as discussed above.  No apparent complications.   Original Report Authenticated By: Rosendo Gros, M.D.    Mm Breast Surgical Specimen  09/21/2011  **ADDENDUM** CREATED: 09/21/2011 11:57:39  Please note that the calcifications within the upper - outer left breast, biopsy proven to be DCIS,  encompasses an area measuring 3.5 x 5 x 6.5 cm.  Addended by:  Elpidio Eric. Si Gaul, M.D. on 09/21/2011 11:57:39.  **END ADDENDUM** SIGNED BY: Tinnie Gens T. Si Gaul, M.D.   09/13/2011  *RADIOLOGY REPORT*  Clinical Data:  Left breast calcifications.  STEREOTACTIC-GUIDED VACUUM ASSISTED BIOPSY OF THE LEFT BREAST AND SPECIMEN RADIOGRAPH  Comparison: Previous exams.  I met with the patient and we discussed the procedure of stereotactic-guided biopsy, including benefits and alternatives. We discussed the high likelihood of a successful procedure. We discussed the risks of the procedure, including infection, bleeding, tissue injury, clip migration, and inadequate sampling. Informed, written consent was given.  Using sterile technique, 2% lidocaine, stereotactic guidance, and a 9 gauge vacuum assisted device, biopsy was performed of the cluster of microcalcifications located within the upper-outer quadrant of the left breast using a  lateral approach.  Specimen radiograph was performed, showing representative  calcifications within the specimen.  Specimens with calcifications are identified for pathology.  At the conclusion of the procedure, an hourglass shaped tissue marker clip was deployed into the biopsy cavity.  Follow-up 2-view mammogram confirmed clip to be in appropriate position.  IMPRESSION: Stereotactic-guided biopsy of left breast calcifications as discussed above.  No apparent complications.   Original Report Authenticated By: Rosendo Gros, M.D.    Mm Digital Diagnostic Unilat R  10/04/2011  *RADIOLOGY REPORT*  Clinical Data:  Status post MR guided core biopsy of the right breast.  DIGITAL DIAGNOSTIC RIGHT MAMMOGRAM  Comparison:  Previous exams.  Findings:  Films are performed following MRI guided biopsy of 2 adjacent nodules enhancement in the posterior medial portion of the right breast.  A dumbbell shaped clip is identified in the posterior upper-outer quadrant of the right breast, in the expected location after biopsy.   IMPRESSION: The tissue marker clip is in expected location after biopsy.   Original Report Authenticated By: Patterson Hammersmith, M.D.    Mm Radiologist Eval And Mgmt  09/21/2011  **ADDENDUM** CREATED: 09/21/2011 11:54:42  Please note that the calcifications within the upper - outer left breast, biopsy proven to be DCIS, encompasses an area measuring 3.5 x 5 x 6.5 cm.  **END ADDENDUM** SIGNED BY: Tinnie Gens T. Si Gaul, M.D.   09/14/2011  *RADIOLOGY REPORT*  ESTABLISHED PATIENT OFFICE VISIT - LEVEL II [phone])  Chief Complaint:  Status post stereotactic biopsy of the left breast  History:  The patient presented with an abnormal left breast screening mammogram.  Magnification views of the left breast showed suspicious calcifications.  The patient underwent a stereotactic biopsy.  Exam:  The left breast is clean and dry with no hematoma.  Pathology: Low grade DCIS was reported histologically.  This corresponds well with the imaging findings.  Assessment and Plan:  The patient is scheduled to be seen at [facility] on 09/21/2011.  Breast MRI has been arranged.   Original Report Authenticated By: Rosendo Gros, M.D.       IMPRESSION: Intraductal carcinoma of the left breast. As above on  imaging the patient has an area of involvement potentially up to 3.5 x 5 x 6.5 cm. Given the patient's breast size as it relates to this area it would be difficult to proceed with breast conserving therapy and to achieve a reasonable cosmetic outcome. Patient is interested in breast conserving therapy if at all possible. In light of this patient will proceed with additional biopsy along the border of involvement to document the extent of disease.  PLAN: After biopsy is completed the patient will be evaluated by Dr. Jamey Ripa. If she is not deemed a breast conserving candidate then she will proceed with mastectomy. She is interested in reconstruction if she has a mastectomy and will  need to see a plastic surgeon prior to her  mastectomy to see if she would be a candidate for immediate reconstruction. I do not anticipate a need for radiation therapy given the above diagnosis of noninvasive breast cancer and the fact that she has already had a biopsy of a suspicious lymph node which turned out to be benign.    ------------------------------------------------   Billie Lade, PhD, MD

## 2011-10-05 NOTE — Patient Instructions (Signed)
We have the second biopsy of the abnormality in the left breast done we can make further decisions based on our discussions today. I will call you when I see the pathology report from that biopsy

## 2011-10-05 NOTE — Patient Instructions (Addendum)
Proceed with biopsy then surgery  I will see you back in 4 -5 weeks

## 2011-10-05 NOTE — Progress Notes (Signed)
Patient ID: Samantha Andersen, female   DOB: September 15, 1965, 46 y.o.   MRN: [id number]  Chief Complaint  Patient presents with  . Breast Cancer    left    HPI Samantha Andersen is a 46 y.o. female.  She had a mammogram and a mallet he was found in the left breast. 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 Patient calcification was about 6.5 cm. She simply had an MRI. A slightly abnormal movement was seen on the left and this was biopsied but was benign. Some areas of abnormality on the right breast are also noted on the MRI and biopsied and or fibrocystic changes which is concordant with the x-ray and MRI. She's there any prior breast problems. HPI  Past Medical History  Diagnosis Date  . Breast cancer   . PCOS (polycystic ovarian syndrome)   . Anemia   . Hyperlipidemia   . Anxiety   . Hot flashes   . Joint pain   . Diabetes mellitus     Gestattional    Past Surgical History  Procedure Date  . Abdominal hysterectomy   . Cesarean section     x2    Family History  Problem Relation Age of Onset  . Lung cancer Maternal Grandfather     Social History History  Substance Use Topics  . Smoking status: Former Smoker    Quit date: 10/04/1985  . Smokeless tobacco: Not on file  . Alcohol Use: No    Allergies  Allergen Reactions  . Doxycycline Shortness Of Breath  . Tetracyclines & Related Shortness Of Breath and Other (See Comments)    Lethargy   . Ciprofloxacin     Current Outpatient Prescriptions  Medication Sig Dispense Refill  . acetaminophen (TYLENOL) 500 MG tablet Take 500 mg by mouth every 6 (six) hours as needed.      . diazepam (VALIUM) 2 MG tablet Take 2 mg by mouth every 6 (six) hours as needed.      . metFORMIN (GLUMETZA) 500 MG (MOD) 24 hr tablet Take 500 mg by mouth 4 (four) times daily.      . vitamin B-12 (CYANOCOBALAMIN) 1000 MCG tablet Take 1,000 mcg by mouth daily.        Review of Systems Review of Systems    Constitutional: Positive for chills and fatigue. Negative for fever and unexpected weight change.  HENT: Negative for hearing loss, congestion, sore throat, trouble swallowing and voice change.   Eyes: Negative for visual disturbance.  Respiratory: Negative for cough and wheezing.   Cardiovascular: Negative for chest pain, palpitations and leg swelling.  Gastrointestinal: Negative for nausea, vomiting, abdominal pain, diarrhea, constipation, blood in stool, abdominal distention and anal bleeding.  Genitourinary: Negative for hematuria, vaginal bleeding and difficulty urinating.       Incontinence  Musculoskeletal: Positive for back pain and arthralgias.  Skin: Negative for rash and wound.  Neurological: Negative for seizures, syncope and headaches.  Hematological: Negative for adenopathy. Does not bruise/bleed easily.       Being evaluated for possible HLA b24 abnormality  Psychiatric/Behavioral: Negative for confusion. The patient is nervous/anxious.     Blood pressure 122/87, pulse 105, temperature 98.9 F (37.2 C), resp. rate 16, height 5\' 2"  (1.575 m), weight 156 lb (70.761 kg).  Physical Exam Physical Exam  Vitals reviewed. Constitutional: She is oriented to person, place, and time. She appears well-developed and well-nourished. No distress.  HENT:  Head: Normocephalic and atraumatic.  Mouth/Throat: Oropharynx  is clear and moist.  Eyes: Conjunctivae normal and EOM are normal. Pupils are equal, round, and reactive to light. No scleral icterus.  Neck: Normal range of motion. Neck supple. No tracheal deviation present. No thyromegaly present.  Cardiovascular: Normal rate, regular rhythm, normal heart sounds and intact distal pulses.  Exam reveals no gallop and no friction rub.   No murmur heard. Pulmonary/Chest: Effort normal and breath sounds normal. No respiratory distress. She has no wheezes. She has no rales.       Breasts are symmetric and normal to inspection and palpation.  They are dense. No mass  Abdominal: Soft. Bowel sounds are normal. She exhibits no distension and no mass. There is no tenderness. There is no rebound and no guarding.  Musculoskeletal: Normal range of motion. She exhibits no edema and no tenderness.  Lymphadenopathy:    She has no cervical adenopathy.    She has no axillary adenopathy.       Right: No supraclavicular adenopathy present.  Neurological: She is alert and oriented to person, place, and time.  Skin: Skin is warm and dry. No rash noted. She is not diaphoretic. No erythema.  Psychiatric: She has a normal mood and affect. Her behavior is normal. Judgment and thought content normal.    Data Reviewed I have reviewed the mammogram and MRI films and reports and the pathology slides and reports the the recommendations of the medical and radiation oncologists.  Assessment    Stage 0 left breast cancer possibly 6.5 cm in extent.    Plan    We are going to obtain a second biopsy to define the extent of the disease. She is likely going to require a mastectomy but there is some potential for breast conservation depending on the extent of DCIS. I have explained the pathophysiology and staging of breast cancer with particular attention to her exact situation. We discussed the multidisciplinary approach to breast cancer which often includes both medical and radiation oncology consultations.  We also discussed surgical options for the treatment of breast cancer including lumpectomy and mastectomy with possible reconstructive surgery. In addition we talked about the evaluation and management of lymph nodes including a description of sentinel lymph node biopsy and axillary dissections. We reviewed potential complications and risks including bleeding, infection, numbness,  lymphedema, and the potential need for additional surgery.  She understands that for patients who are candidate for lumpectomy or mastectomy there is an equal survival rate  with either technique, but a slightly higher local recurrence rate with lumpectomy. In addition she knows that a lumpectomy usually requires postoperative radiation as part of the management of the breast cancer.  We have discussed the likely postoperative course and plans for followup.  I have given the patient some written information that reviewed all of these issues. I believe her questions are answered and that she has a good understanding of the issues.    If she needs mastectomy she is interested in reconstruction    Jenese Mischke J 10/05/2011, 2:58 PM

## 2011-10-05 NOTE — Telephone Encounter (Signed)
gv pt appt schedule for September and October.  °

## 2011-10-05 NOTE — Telephone Encounter (Signed)
Gave patient appointment for 11-10-2011

## 2011-10-06 ENCOUNTER — Encounter: Payer: Self-pay | Admitting: Genetic Counselor

## 2011-10-06 ENCOUNTER — Other Ambulatory Visit: Payer: BC Managed Care – PPO | Admitting: Lab

## 2011-10-06 ENCOUNTER — Encounter: Payer: Self-pay | Admitting: Specialist

## 2011-10-06 ENCOUNTER — Ambulatory Visit (HOSPITAL_BASED_OUTPATIENT_CLINIC_OR_DEPARTMENT_OTHER): Payer: BC Managed Care – PPO | Admitting: Genetic Counselor

## 2011-10-06 DIAGNOSIS — Z801 Family history of malignant neoplasm of trachea, bronchus and lung: Secondary | ICD-10-CM

## 2011-10-06 DIAGNOSIS — D0590 Unspecified type of carcinoma in situ of unspecified breast: Secondary | ICD-10-CM

## 2011-10-06 DIAGNOSIS — C50419 Malignant neoplasm of upper-outer quadrant of unspecified female breast: Secondary | ICD-10-CM

## 2011-10-06 NOTE — Progress Notes (Signed)
Dr.  Drue Second requested a consultation for genetic counseling and risk assessment for Samantha Andersen, a 46 y.o. female, for discussion of her personal history of breast cancer. She presents to clinic today to discuss the possibility of a genetic predisposition to cancer, and to further clarify her risks, as well as her family members' risks for cancer.   HISTORY OF PRESENT ILLNESS: In 2013, at the age of 62, Samantha Andersen was diagnosed with breast cancer.    Past Medical History  Diagnosis Date  . Breast cancer   . PCOS (polycystic ovarian syndrome)   . Anemia   . Hyperlipidemia   . Anxiety   . Hot flashes   . Joint pain   . Diabetes mellitus     Gestattional    Past Surgical History  Procedure Date  . Abdominal hysterectomy   . Cesarean section     x2    History  Substance Use Topics  . Smoking status: Former Smoker -- 2.0 packs/day for 4 years    Quit date: 10/04/1985  . Smokeless tobacco: Not on file  . Alcohol Use: No    REPRODUCTIVE HISTORY AND PERSONAL RISK ASSESSMENT FACTORS: Menarche was at age 88.   Premenopause Uterus Intact: No Ovaries Intact: Yes G2P2A0 , first live birth at age 70  She has previously undergone treatment for infertility.   Short time use of OCPs   She has not used HRT in the past.    FAMILY HISTORY:  We obtained a detailed, 4-generation family history.  Significant diagnoses are listed below: Family History  Problem Relation Age of Onset  . Lung cancer Maternal Grandfather   . Hemochromatosis Sister   . Prostate cancer Paternal Uncle 61  . Cancer Paternal Uncle 79    unknown type of cancer  the patient was diagnosed with breast cancer at age 70.  Her mother underwent a lumpectomy in 1974, at age 18, for unknown reasons.  The patient's maternal grandfather had lung cancer, and was a Forensic scientist, her grandmother died at 23 of natural causes, and her grandmother's brother died of colon cancer.  The patients paternal uncle has  prostate cancer diagnosed at age 49, and another uncle died of an unknown cancer.  Patient's maternal ancestors are of [place] and Native American descent, and paternal ancestors are of [place] and [place] descent. There is no reported Ashkenazi Jewish ancestry. There is no  known consanguinity.  GENETIC COUNSELING RISK ASSESSMENT, DISCUSSION, AND SUGGESTED FOLLOW UP: We reviewed the natural history and genetic etiology of sporadic, familial and hereditary cancer syndromes.  About 5-10% of breast cancer is hereditary.  Of this, about 85% is the result of a BRCA1 or BRCA2 mutation.  We reviewed the red flags of hereditary cancer syndromes and the dominant inheritance patterns.  If the BRCA testing is negative, we discussed that we could be testing for the wrong gene.  We discussed gene panels, and that several cancer genes that are associated with different cancers can be tested at the same time.  Because of the different types of cancer that are in the patient's family, we will consider the [facility]Next panel tests if she is negative for BRCA mutations.   The patient's personal history of breast cancer is suggestive of the following possible diagnosis: hereditary cancer syndrome  We discussed that identification of a hereditary cancer syndrome may help her care providers tailor the patients medical management. If a mutation indicating a hereditary cancer syndrome is detected in this case,  the [employer] recommendations would include increased cancer surveillance and possible prophylactic surgery. If a mutation is detected, the patient will be referred back to the referring provider and to any additional appropriate care providers to discuss the relevant options.   If a mutation is not found in the patient, this will decrease the likelihood of a hereditary cancer syndrome as the explanation for her breast cancer. Cancer surveillance options would be discussed for the patient  according to the appropriate standard National Comprehensive Cancer Network and American Cancer Society guidelines, with consideration of their personal and family history risk factors. In this case, the patient will be referred back to their care providers for discussions of management.   In order to estimate her chance of having a BRCA1 or BRCA2 mutation, we used statistical models (Penn II) and laboratory data that take into account her personal medical history, family history and ancestry.  Because each model is different, there can be a lot of variability in the risks they give.  Therefore, these numbers must be considered a rough range and not a precise risk of having a BRCA1 or BRCA2 mutation.  These models estimate that she has approximately a 8-13% chance of having a mutation. Based on this assessment of her family and personal history, genetic testing is recommended.  After considering the risks, benefits, and limitations, the patient provided informed consent for  the following  testing: BRCAPlus reflexing to [facility]Next if negative through [employer]   Per the patient's request, we will contact her husband by telephone to discuss these results. A follow up genetic counseling visit will be scheduled if indicated.  The patient was seen for a total of 60 minutes, greater than 50% of which was spent face-to-face counseling.  This plan is being carried out per Dr. Drue Second recommendations.  This note will also be sent to the referring provider via the electronic medical record. The patient will be supplied with a summary of this genetic counseling discussion as well as educational information on the discussed hereditary cancer syndromes following the conclusion of their visit.   Patient was discussed with Dr. Drue Second.   _______________________________________________________________________ For Office Staff:  Number of people involved in session: 2 Was an Intern/ student involved  with case: no

## 2011-10-06 NOTE — Progress Notes (Signed)
I met Samantha Andersen at [facility] and reviewed with her the distress screening.  She indicated her stress level as 5-6, saying that her stress is affected by the fact that she has to have a second biopsy and does not have a final treatment plan.  She expressed concern about her teenage children, and I told her about [facility].  I also gave her information on the support services available at the [facility].  I encouraged her to attend support group and also to consider a [facility] volunteer.

## 2011-10-10 ENCOUNTER — Telehealth: Payer: Self-pay | Admitting: *Deleted

## 2011-10-10 NOTE — Progress Notes (Signed)
Samantha Andersen [id number] 24-Nov-1965 46 y.o. 10/10/2011 4:59 PM  CC  Turner Daniels, MD [address] [place] [address] Dr. Antony Blackbird Dr. 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  STAGE:   Cancer of upper-outer quadrant of female breast   Primary site: Breast (Left)   Staging method: AJCC 7th Edition   Clinical: Stage 0 (Tis (DCIS), N0, cM0)   Summary: Stage 0 (Tis (DCIS), N0, cM0)  REFERRING PHYSICIAN: Dr. Cyndia Bent  HISTORY OF PRESENT ILLNESS:  Samantha Andersen is a 46 y.o. female.  Without significant past medical history who recently had a screening mammogram performed that showed an abnormality in the left breast consisting of calcifications. She subsequently had needle core biopsies performed and the pathology showed a ductal carcinoma in situ that was ER positive PR positive. Total extent of calcification was about 6.5 cm. Patient went on to have MRI of the breasts performed that showed 2 adjacent oval homogeneously enhancing masses in the retroareolar region of the right breast as well as a prominent lymph node in the left axilla measuring 1.2 x 1.0 x 1.1 cm. She was recommended biopsy of the masses in the retroareolar area. The lymph node was also biopsy that was negative. In addition was recommended stereotactic biopsy of the extent of calcifications within the upper outer aspect of the breast area the patient is going to be having breast conserving surgery. The right breast reveals fibrocystic changes and usual ductal hyperplasia and no evidence of malignancy. Patient's case was discussed at the multidisciplinary breast conference. Recommendations made were placed on NCCN guidelines for DCIS clinical stage 0 disease. Patient  is without any significant complaints.   Past Medical History: Past Medical History  Diagnosis Date  . Breast cancer   . PCOS (polycystic ovarian syndrome)   . Anemia   . Hyperlipidemia   . Anxiety   . Hot flashes   . Joint pain   . Diabetes mellitus     Gestattional    Past Surgical History: Past Surgical History  Procedure Date  . Abdominal hysterectomy   . Cesarean section     x2    Family History: Family History  Problem Relation Age of Onset  . Lung cancer Maternal Grandfather   . Hemochromatosis Sister   . Prostate cancer Paternal Uncle 40  . Cancer Paternal Uncle 38    unknown type of cancer    Social History History  Substance Use Topics  . Smoking status: Former Smoker -- 2.0 packs/day for 4 years    Quit date: 10/04/1985  . Smokeless tobacco: Not on file  . Alcohol Use: No    Allergies: Allergies  Allergen Reactions  . Doxycycline Shortness Of Breath  . Tetracyclines & Related Shortness Of Breath and Other (See Comments)    Lethargy   . Ciprofloxacin     Current Medications: Current Outpatient Prescriptions  Medication Sig Dispense Refill  . acetaminophen (TYLENOL) 500 MG tablet Take 500 mg by mouth every 6 (six) hours as needed.      . diazepam (VALIUM) 2 MG tablet Take 2 mg by mouth every 6 (six) hours as needed.      . metFORMIN (GLUMETZA) 500 MG (MOD) 24 hr tablet Take 500 mg by mouth 4 (four) times daily.      Marland Kitchen  vitamin B-12 (CYANOCOBALAMIN) 1000 MCG tablet Take 1,000 mcg by mouth daily.        OB/GYN History: Menarche at age 7 patient is postmenopausal last menstrual cycle was August 2001. She has never been on replacement therapy patient has used fertility drugs specifically Clomid. She has given birth to 2 children first live birth was at 85.  Fertility Discussion:N/A Prior History of Cancer:N/A  Health Maintenance:  Colonoscopy no Bone Density no Last PAP smear August 2013  ECOG PERFORMANCE STATUS: 0 -  Asymptomatic  Genetic Counseling/testing: 2 early onset breast cancer and breast cancer in the family patient is recommended genetic counseling and testing. And she will be referred.  REVIEW OF SYSTEMS: In general patient does experience fatigue she does have achy hips on and off she has some chills and night sweats especially at nighttime. She does have some history of palpitations and irregular heartbeat she occasionally does have incontinence of urine. She has pain in her breast due to the biopsies. She does have abnormal moles that she has noticed that she does see a dermatologist she also complains of joint pain and back pain and significant amount of anxiety and forgetfulness. She is experiencing hot flashes. She otherwise denies any fever any headaches double vision blurring of vision no shortness of breath no chest pains she has no cough hemoptysis hematemesis no abnormal pain no changes in her stool bowel habits remainder of the 10 point review of systems is negative.   PHYSICAL EXAMINATION: Blood pressure 122/87, pulse 105, temperature 98.9 F (37.2 C), temperature source Oral, resp. rate 20, height 5\' 2"  (1.575 m), weight 155 lb 9.6 oz (70.58 kg). Well-developed well-nourished female in no acute distress HEENT exam EOMI PERRLA sclerae anicteric no conjunctival pallor oral mucosa is moist neck is supple lungs are clear to auscultation and percussion cardiovascular is regular rate rhythm no murmurs gallops or rubs abdomen is soft nontender nondistended bowel sounds are present no hepatosplenomegaly extremities no clubbing edema or cyanosis neuro patient's alert oriented otherwise nonfocal breast exam no palpable masses there is area of ecchymosis in the right and the left breast. Palpable node in the left axilla  STUDIES/RESULTS: US Breast Bilateral  09/29/2011  *RADIOLOGY REPORT*  Clinical Data:  Known left breast malignancy.  MRI follow-up for prominent left axillary lymph node and two  nodules within the retroareolar region of the right breast.  BILATERAL BREAST ULTRASOUND  Comparison:  Mammogram 09/13/2011 and earlier and MRI 09/21/2011  On physical exam, I palpate no abnormality in the central portion of the right breast.  Findings: Ultrasound is performed, showing numerous small hypoechoic and anechoic rounded structures in the central aspect of the right breast, most consistent with fibrocystic changes.  No definite solid mass identified to correlate with the abnormalities identified on MRI.  Within the left axilla, a single slightly prominent lymph node is identified with slightly thickened cortex is identified. Biopsy is recommended to exclude malignancy.  IMPRESSION:  1.  No sonographic correlate for the 2 adjacent enhancing nodules in the central portion of the right breast seen on MRI. 2.  Slightly prominent left axillary lymph node.  RECOMMENDATION:  1.  MR guided core biopsy of the right breast. 2.  Ultrasound guided core biopsy of left axillary lymph node.  BI-RADS CATEGORY 4:  Suspicious abnormality - biopsy should be considered.   Original Report Authenticated By: Patterson Hammersmith, M.D.    Mr Breast Bilateral W Wo Contrast  09/21/2011  **ADDENDUM** CREATED: 09/21/2011 11:56:25  Please note that the calcifications within the upper - outer left breast, biopsy proven to be DCIS, encompasses an area measuring 3.5 x 5 x 6.5 cm.  Addended by:  Elpidio Eric. Si Gaul, M.D. on 09/21/2011 11:56:25.  **END ADDENDUM** SIGNED BY: Tinnie Gens T. Si Gaul, M.D.   09/20/2011  *RADIOLOGY REPORT*  Clinical Data: New diagnosis DCIS, left breast.  BUN and creatinine were obtained on site at [facility] at [address] Results:  BUN 11 mg/dL,  Creatinine 0.8 mg/dL.  BILATERAL BREAST MRI WITH AND WITHOUT CONTRAST  Technique: Multiplanar, multisequence MR images of both breasts were obtained prior to and following the intravenous administration of 15ml of Multihance.  Three dimensional images were  evaluated at the independent DynaCad workstation.  Comparison:  None.  Findings: Moderate background parenchymal enhancement seen bilaterally and there foci of nonspecific enhancement seen. Postbiopsy changes are seen in the upper-outer quadrant of the left breast, posteriorly with  biopsy clip artifact.  There is no significant residual enhancement or mass.  No suspicious enhancement or mass is seen in the left breast. A prominent lymph node with an attenuated fatty hilum is imaged in the left axilla measuring 1.2 x 1.0 x 1.1 cm.  In the right breast, two adjacent oval homogeneously enhancing masses with circumscribed margins and plateau kinetics are imaged in the retroareolar region of the right breast, posteriorly measuring 0.5 and 0.3 cm encompassing in the area of 1.2 cm in total.  No other suspicious mass or enhancement seen in the right breast. There is no axillary or internal mammary adenopathy seen on the right.  IMPRESSION:  1.  Known malignancy, left breast.  If breast conservation therapy is desired, consideration of stereotactic biopsy of the left breast is recommended to evaluate for extent of disease.  Also, sonographic evaluation is recommended of the left axillary lymph node. 2.  Right breast masses for which a second look ultrasound is recommended.  If these are not seen sonographically, an MRI guided biopsy is recommended.  RECOMMENDATION: Second look ultrasound for left axillary lymph node and right breast masses.  Possible stereotactic biopsy for evaluation of extent disease, left breast.  THREE-DIMENSIONAL MR IMAGE RENDERING ON INDEPENDENT WORKSTATION:  Three-dimensional MR images were rendered by post-processing of the original MR data on an independent workstation.  The three- dimensional MR images were interpreted, and findings were reported in the accompanying complete MRI report for this study.  BI-RADS CATEGORY 6:  Known biopsy-proven malignancy - appropriate action should be taken.    Original Report Authenticated By: Rosendo Gros, M.D.    Mr Biopsy/wire Localization  10/06/2011  **ADDENDUM** CREATED: 10/06/2011 11:23:10  Breast, right, needle core biopsy, posterior, medial - FIBROCYSTIC CHANGES WITH USUAL DUCTAL HYPERPLASIA. - THERE IS NO EVIDENCE OF MALIGNANCY. - SEE COMMENT.  Pathology correlates well with the MRI appearance.  At the request of the patient, I spoke with her by telephone.  She reports doing well after biopsy.  The patient was seen at multidisciplinary clinic on 10/05/2011. A decision is pending regarding additional calcifications in the left breast.  Addended by:  Blair Hailey. Manson Passey, M.D. on 10/06/2011 11:23:10.  **END ADDENDUM** SIGNED BY: Blair Hailey. Manson Passey, M.D.   10/04/2011  *RADIOLOGY REPORT*  Clinical Data:  The patient was recently diagnosed with ductal carcinoma in situ in the left breast.  Two adjacent foci of enhancement are identified within the medial posterior portion of the right breast.  The patient returns for biopsy with MR guidance.  MRI GUIDED  VACUUM ASSISTED BIOPSY OF THE RIGHT BREAST WITHOUT AND WITH CONTRAST  Comparison: Previous exams.  Technique: Multiplanar, multisequence MR images of the right breast were obtained prior to and following the intravenous administration of 14 ml of Mulithance.  I met with the patient, and we discussed the procedure of MRI guided biopsy, including risks, benefits, and alternatives. Specifically, we discussed the risks of infection, bleeding, tissue injury, clip migration, and inadequate sampling.  Informed, written consent was given.  Using sterile technique, 2% Lidocaine, MRI guidance, and a 9 gauge vacuum assisted device, biopsy was performed of two adjacent nodules in the central medial posterior portion of the right breast, using a lateral approach.  At the conclusion of the procedure, a  tissue marker clip was deployed into the biopsy cavity.  IMPRESSION: MRI guided biopsy of posterior medial right breast. No  apparent complications.  THREE-DIMENSIONAL MR IMAGE RENDERING ON INDEPENDENT WORKSTATION:  Three-dimensional MR images were rendered by post-processing of the original MR data on an independent workstation.  The three- dimensional MR images were interpreted, and findings were reported in the accompanying complete MRI report for this study.   Original Report Authenticated By: Patterson Hammersmith, M.D.    [place] Core Biopsy  09/30/2011  **ADDENDUM** CREATED: 09/30/2011 15:19:17  Pathology shows benign lymph node tissue.  No metastatic carcinoma identified.  The pathology correlates well with the imaging appearance.  I spoke with the patient by telephone.  She reports doing well after the biopsy.  She reports no significant bruising or bleeding at the biopsy site.  The patient is scheduled for MR guided core biopsy of the right breast on 10/04/2011.  **END ADDENDUM** SIGNED BY: Blair Hailey. Manson Passey, M.D.   09/29/2011  *RADIOLOGY REPORT*  Clinical Data:  Known left breast cancer.  Prominent left axillary node lymph node identified by MRI and ultrasound.  ULTRASOUND GUIDED CORE BIOPSY OF THE LEFT AXILLA  Comparison: 09/29/2011 and earlier  I met with the patient and we discussed the procedure of ultrasound- guided biopsy, including benefits and alternatives.  We discussed the high likelihood of a successful procedure. We discussed the risks of the procedure, including infection, bleeding, tissue injury, clip migration, and inadequate sampling.  Informed written consent was given.  Using sterile technique 2% lidocaine, ultrasound guidance and a 14 gauge automated biopsy device, biopsy was performed of left axillary lymph node using a lateral approach and coaxial technique.  IMPRESSION: Ultrasound guided biopsy of left axillary lymph node.  No apparent complications.  Original Report Authenticated By: Patterson Hammersmith, M.D.    Mm Breast Stereo Biopsy Left  09/21/2011  **ADDENDUM** CREATED: 09/21/2011 11:57:39  Please note  that the calcifications within the upper - outer left breast, biopsy proven to be DCIS, encompasses an area measuring 3.5 x 5 x 6.5 cm.  Addended by:  Elpidio Eric. Si Gaul, M.D. on 09/21/2011 11:57:39.  **END ADDENDUM** SIGNED BY: Tinnie Gens T. Si Gaul, M.D.   09/13/2011  *RADIOLOGY REPORT*  Clinical Data:  Left breast calcifications.  STEREOTACTIC-GUIDED VACUUM ASSISTED BIOPSY OF THE LEFT BREAST AND SPECIMEN RADIOGRAPH  Comparison: Previous exams.  I met with the patient and we discussed the procedure of stereotactic-guided biopsy, including benefits and alternatives. We discussed the high likelihood of a successful procedure. We discussed the risks of the procedure, including infection, bleeding, tissue injury, clip migration, and inadequate sampling. Informed, written consent was given.  Using sterile technique, 2% lidocaine, stereotactic guidance, and a 9 gauge vacuum assisted device, biopsy was performed of the cluster of  microcalcifications located within the upper-outer quadrant of the left breast using a  lateral approach.  Specimen radiograph was performed, showing representative calcifications within the specimen.  Specimens with calcifications are identified for pathology.  At the conclusion of the procedure, an hourglass shaped tissue marker clip was deployed into the biopsy cavity.  Follow-up 2-view mammogram confirmed clip to be in appropriate position.  IMPRESSION: Stereotactic-guided biopsy of left breast calcifications as discussed above.  No apparent complications.   Original Report Authenticated By: Rosendo Gros, M.D.    Mm Breast Surgical Specimen  09/21/2011  **ADDENDUM** CREATED: 09/21/2011 11:57:39  Please note that the calcifications within the upper - outer left breast, biopsy proven to be DCIS, encompasses an area measuring 3.5 x 5 x 6.5 cm.  Addended by:  Elpidio Eric. Si Gaul, M.D. on 09/21/2011 11:57:39.  **END ADDENDUM** SIGNED BY: Tinnie Gens T. Si Gaul, M.D.   09/13/2011  *RADIOLOGY REPORT*  Clinical Data:  Left  breast calcifications.  STEREOTACTIC-GUIDED VACUUM ASSISTED BIOPSY OF THE LEFT BREAST AND SPECIMEN RADIOGRAPH  Comparison: Previous exams.  I met with the patient and we discussed the procedure of stereotactic-guided biopsy, including benefits and alternatives. We discussed the high likelihood of a successful procedure. We discussed the risks of the procedure, including infection, bleeding, tissue injury, clip migration, and inadequate sampling. Informed, written consent was given.  Using sterile technique, 2% lidocaine, stereotactic guidance, and a 9 gauge vacuum assisted device, biopsy was performed of the cluster of microcalcifications located within the upper-outer quadrant of the left breast using a  lateral approach.  Specimen radiograph was performed, showing representative calcifications within the specimen.  Specimens with calcifications are identified for pathology.  At the conclusion of the procedure, an hourglass shaped tissue marker clip was deployed into the biopsy cavity.  Follow-up 2-view mammogram confirmed clip to be in appropriate position.  IMPRESSION: Stereotactic-guided biopsy of left breast calcifications as discussed above.  No apparent complications.   Original Report Authenticated By: Rosendo Gros, M.D.    Mm Digital Diagnostic Unilat R  10/04/2011  *RADIOLOGY REPORT*  Clinical Data:  Status post MR guided core biopsy of the right breast.  DIGITAL DIAGNOSTIC RIGHT MAMMOGRAM  Comparison:  Previous exams.  Findings:  Films are performed following MRI guided biopsy of 2 adjacent nodules enhancement in the posterior medial portion of the right breast.  A dumbbell shaped clip is identified in the posterior upper-outer quadrant of the right breast, in the expected location after biopsy.  IMPRESSION: The tissue marker clip is in expected location after biopsy.   Original Report Authenticated By: Patterson Hammersmith, M.D.    Mm Radiologist Eval And Mgmt  09/21/2011  **ADDENDUM** CREATED:  09/21/2011 11:54:42  Please note that the calcifications within the upper - outer left breast, biopsy proven to be DCIS, encompasses an area measuring 3.5 x 5 x 6.5 cm.  **END ADDENDUM** SIGNED BY: Tinnie Gens T. Si Gaul, M.D.   09/14/2011  *RADIOLOGY REPORT*  ESTABLISHED PATIENT OFFICE VISIT - LEVEL II [phone])  Chief Complaint:  Status post stereotactic biopsy of the left breast  History:  The patient presented with an abnormal left breast screening mammogram.  Magnification views of the left breast showed suspicious calcifications.  The patient underwent a stereotactic biopsy.  Exam:  The left breast is clean and dry with no hematoma.  Pathology: Low grade DCIS was reported histologically.  This corresponds well with the imaging findings.  Assessment and Plan:  The patient is scheduled to be seen at the Multidisclinary  Clinic on 09/21/2011.  Breast MRI has been arranged.   Original Report Authenticated By: Rosendo Gros, M.D.      LABS:    Chemistry      Component Value Date/Time   NA 139 10/05/2011 1154   K 3.9 10/05/2011 1154   CL 103 10/05/2011 1154   CO2 24 10/05/2011 1154   BUN 11.0 10/05/2011 1154   CREATININE 0.8 10/05/2011 1154      Component Value Date/Time   CALCIUM 9.9 10/05/2011 1154   ALKPHOS 45 10/05/2011 1154   AST 16 10/05/2011 1154   ALT 13 10/05/2011 1154   BILITOT 0.30 10/05/2011 1154      Lab Results  Component Value Date   WBC 8.8 10/05/2011   HGB 13.7 10/05/2011   HCT 41.5 10/05/2011   MCV 93.9 10/05/2011   PLT 292 10/05/2011       PATHOLOGY: ADDITIONAL INFORMATION: PROGNOSTIC INDICATORS - ACIS Results IMMUNOHISTOCHEMICAL AND MORPHOMETRIC ANALYSIS BY THE AUTOMATED CELLULAR IMAGING SYSTEM (ACIS) This in situ carcinoma shows the following breast prognostic profile. Estrogen Receptor (Negative, <1%): 99%,POSITIVE, MODERATE STAINING INTENSITY Progesterone Receptor (Negative, <1%): 69%,POSITIVE, MODERATE STAINING INTENSITY All controls stained appropriately Abigail Miyamoto  MD Pathologist, Electronic Signature ( Signed 09/16/2011) FINAL DIAGNOSIS Diagnosis Breast, left, needle core biopsy, 2 o'clock - DUCTAL CARCINOMA IN SITU WITH CALCIFICATIONS. - SEE COMMENT. Microscopic Comment The carcinoma appears low to intermediate grade. Estrogen receptor and progesterone receptor studies will be performed and the results reported separately. The results were called to [facility] on 09/14/2011. (JBK:eps 09/14/11) Pecola Leisure MD Pathologist, Electronic Signature (Case signed 09/14/2011) Specimen FINAL DIAGNOSIS Diagnosis Lymph node, needle/core biopsy, left axilla - BENIGN LYMPH NODE TISSUE. - NO METASTATIC CARCINOMA IDENTIFIED. Microscopic Comment  Diagnosis Breast, right, needle core biopsy, posterior, medial - FIBROCYSTIC CHANGES WITH USUAL DUCTAL HYPERPLASIA. - THERE IS NO EVIDENCE OF MALIGNANCY. - SEE COMMENT. Microscopic Comment The results were called to [facility] on 10/05/2011. (JBK:gt, 10/05/11) JOSHUA KISH  ASSESSMENT    37 female with DCIS of the left breast measuring 3.5 x 5 x 6.5 cm. Patient is seen in the multidisciplinary breast clinic for discussion of treatment options. Patient is interested in breast conservation. Because she has multiple areas it is recommended that we proceed with additional biopsies along the border of involvement to document extent of disease. If this is negative then there may be a possibility that she could have breast conservation candidate however if this is positive then she most likely will opt to proceed with a mastectomy. All of this is discussed with the patient by both the surgeon as well as with the radiation oncologist and myself. Because patient's tumor is ER positive she would eventually be a candidate for chemoprevention with tamoxifen. I have discussed this with her in detail. Due to patient's early onset breast cancer I have recommended genetic counseling and testing and  she will be referred.  If patient does undergo breast conservation then she would be a candidate for NSABP B-43 clinical study. Briefly discussed this with the patient as well.  Clinical Trial Eligibility:possible NSABP B-43   PLAN:    #1 patient will proceed with additional biopsies to define extent of involvement with cancer on the left side.  #2 she will then proceed with further surgery. Pending on the biopsy results.  #3 patient will eventually need chemoprevention and I have discussed this with her. This will mainly consist of an antiestrogen such as tamoxifen  since she is premenopausal.  #4 she will be seen back after her surgery.  #5 patient will be referred to genetic counseling.     Thank you so much for allowing me to participate in the care of Juanita Streight. I will continue to follow up the patient with you and assist in her care.  All questions were answered. The patient knows to call the clinic with any problems, questions or concerns. We can certainly see the patient much sooner if necessary.  I spent 60 minutes counseling the patient face to face. The total time spent in the appointment was 60 minutes.  Drue Second, MD Medical/Oncology [facility] [phone] (beeper) [phone] (Office)  10/10/2011, 4:59 PM

## 2011-10-10 NOTE — Telephone Encounter (Signed)
Spoke to pt concerning [facility]C from 10/05/11.  Pt denies questions or concerns regarding dx or treatment care plan.  Pt relate that if 2nd bx should come back + and she should need a mastectomy that she would like to have reconstruction.  Informed pt that I would schedule her with plastic surgeon if she needs a mastectomy.  Pt denies further needs at this time.  Encourage pt to call with questions.  Received verbal understanding.  Contact information given.

## 2011-10-11 ENCOUNTER — Ambulatory Visit
Admission: RE | Admit: 2011-10-11 | Discharge: 2011-10-11 | Disposition: A | Discharge status: Home / Self Care | Payer: BC Managed Care – PPO | Source: Ambulatory Visit | Attending: Obstetrics and Gynecology | Admitting: Obstetrics and Gynecology

## 2011-10-11 ENCOUNTER — Ambulatory Visit
Admission: RE | Admit: 2011-10-11 | Discharge: 2011-10-11 | Disposition: A | Discharge status: Home / Self Care | Payer: BC Managed Care – PPO | Source: Ambulatory Visit | Attending: Surgery | Admitting: Surgery

## 2011-10-11 DIAGNOSIS — C50912 Malignant neoplasm of unspecified site of left female breast: Secondary | ICD-10-CM

## 2011-10-11 DIAGNOSIS — R928 Other abnormal and inconclusive findings on diagnostic imaging of breast: Secondary | ICD-10-CM

## 2011-10-12 ENCOUNTER — Telehealth (INDEPENDENT_AMBULATORY_CARE_PROVIDER_SITE_OTHER): Payer: Self-pay | Admitting: General Surgery

## 2011-10-12 ENCOUNTER — Telehealth (INDEPENDENT_AMBULATORY_CARE_PROVIDER_SITE_OTHER): Payer: Self-pay | Admitting: Surgery

## 2011-10-12 ENCOUNTER — Telehealth (INDEPENDENT_AMBULATORY_CARE_PROVIDER_SITE_OTHER): Payer: Self-pay

## 2011-10-12 DIAGNOSIS — C50919 Malignant neoplasm of unspecified site of unspecified female breast: Secondary | ICD-10-CM

## 2011-10-12 NOTE — Telephone Encounter (Signed)
She has DCIS in the second Bx so there is a 7 cm area of involvement. As discussed at the intital visit, total mastectomy and SLN would be best option and she would like to see Dr Odis Luster for consideration of immediate reconstruction. Patient was at work, but talked to husband today.

## 2011-10-12 NOTE — Telephone Encounter (Signed)
Spoke with [employer]. Aware patient's second biopsy is positive. Waiting on Dr Jamey Ripa for direction.

## 2011-10-12 NOTE — Telephone Encounter (Signed)
Referral made to Dr Odis Luster for next week. Patient aware. We will follow up about surgery once we hear from Dr Odis Luster.

## 2011-10-12 NOTE — Telephone Encounter (Signed)
Message copied by Liliana Cline on Wed Oct 12, 2011  1:24 PM ------      Message from: Currie Paris      Created: Wed Oct 12, 2011  1:11 PM       She will need total mastectomy and SLN. 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 After she sees Odis Luster I can put orders etc in.      ----- Message -----         From: Liliana Cline, CMA         Sent: 10/12/2011  10:18 AM           To: Currie Paris, MD            Looks like patient's second biopsy is positive for cancer. This is a patient you saw at [facility]. Please advise.

## 2011-10-12 NOTE — Telephone Encounter (Signed)
Please call her regarding this patient.

## 2011-10-26 ENCOUNTER — Encounter: Payer: Self-pay | Admitting: Nutrition

## 2011-10-26 NOTE — Progress Notes (Signed)
Patient attended breast cancer nutrition class entitled food for your fight on Tuesday, 10/25/2011. Patient received diet education on eating healthfully during treatment for breast cancer.  

## 2011-10-28 ENCOUNTER — Telehealth (INDEPENDENT_AMBULATORY_CARE_PROVIDER_SITE_OTHER): Payer: Self-pay | Admitting: General Surgery

## 2011-10-28 NOTE — Telephone Encounter (Signed)
Samantha Andersen saw Dr Odis Luster today and walked into our office with her decision about surgery. She wants to proceed with bilateral mastectomies and immediate reconstruction with Dr Odis Luster. Please write orders.

## 2011-11-02 ENCOUNTER — Encounter: Payer: Self-pay | Admitting: Genetic Counselor

## 2011-11-03 ENCOUNTER — Other Ambulatory Visit (INDEPENDENT_AMBULATORY_CARE_PROVIDER_SITE_OTHER): Payer: Self-pay | Admitting: Surgery

## 2011-11-03 DIAGNOSIS — C50912 Malignant neoplasm of unspecified site of left female breast: Secondary | ICD-10-CM

## 2011-11-04 ENCOUNTER — Telehealth: Payer: Self-pay | Admitting: *Deleted

## 2011-11-04 NOTE — Telephone Encounter (Signed)
R/S f/u appt with Dr. Welton Flakes to 12/16/11 d/t surgery is scheduled for 11/14.  Gave pt new date and time.  Pt denies further needs or concerns at this time.  Encourage pt to call with needs.  Received verbal understanding.  Contact information given.

## 2011-11-10 ENCOUNTER — Ambulatory Visit: Payer: BC Managed Care – PPO | Admitting: Oncology

## 2011-11-14 ENCOUNTER — Encounter: Payer: Self-pay | Admitting: *Deleted

## 2011-11-14 NOTE — Progress Notes (Signed)
Mailed after appt letter to pt. 

## 2011-11-15 ENCOUNTER — Encounter (HOSPITAL_COMMUNITY): Payer: Self-pay | Admitting: Pharmacy Technician

## 2011-11-16 ENCOUNTER — Encounter (INDEPENDENT_AMBULATORY_CARE_PROVIDER_SITE_OTHER): Payer: Self-pay | Admitting: Surgery

## 2011-11-16 ENCOUNTER — Ambulatory Visit (INDEPENDENT_AMBULATORY_CARE_PROVIDER_SITE_OTHER): Payer: BC Managed Care – PPO | Admitting: Surgery

## 2011-11-16 VITALS — BP 112/78 | HR 84 | Temp 97.4°F | Resp 16 | Ht 62.0 in | Wt 150.0 lb

## 2011-11-16 DIAGNOSIS — C50419 Malignant neoplasm of upper-outer quadrant of unspecified female breast: Secondary | ICD-10-CM

## 2011-11-16 NOTE — Progress Notes (Signed)
Patient ID: Samantha Andersen, female   DOB: 11/25/1965, 46 y.o.   MRN: [id number]  Chief Complaint  Patient presents with  . Pre-op Exam    bil maste sched 11/24/11    HPI Samantha Andersen is a 46 y.o. female.  She was seen a few weeks ago and further evaluation done. 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 A suspicious area in the right breast was biopsied but was benign. Nevertheless, after reviewing her available options she is going to proceed to bilateral mastectomies with reconstructions. We plan sentinel lymph node on the right side. She came in today for preoperative evaluation and discussions. She's had no intercurrent illnesses since she was seen in the breast clinic a few weeks ago.  Past Medical History  Diagnosis Date  . PCOS (polycystic ovarian syndrome)   . Anemia   . Hyperlipidemia   . Anxiety   . Hot flashes   . Joint pain   . Diabetes mellitus     Gestattional  . Breast cancer     left    Past Surgical History  Procedure Date  . Abdominal hysterectomy   . Cesarean section     x2    Family History  Problem Relation Age of Onset  . Lung cancer Maternal Grandfather   . Hemochromatosis Sister   . Prostate cancer Paternal Uncle 67  . Cancer Paternal Uncle 66    unknown type of cancer    Social History History  Substance Use Topics  . Smoking status: Former Smoker -- 2.0 packs/day for 4 years    Quit date: 10/04/1985  . Smokeless tobacco: Not on file  . Alcohol Use: Yes     Comment: occ.    Allergies  Allergen Reactions  . Doxycycline Shortness Of Breath  . Tetracyclines & Related Shortness Of Breath and Other (See Comments)    Lethargy   . Ciprofloxacin     unknown    Current Outpatient Prescriptions  Medication Sig Dispense Refill  . acetaminophen (TYLENOL) 500 MG tablet Take 500 mg by mouth every 6 (six) hours as needed. For pain      . diazepam (VALIUM) 2 MG tablet Take 2 mg by mouth every 6 (six) hours as  needed. For anxiety      . metFORMIN (GLUCOPHAGE-XR) 500 MG 24 hr tablet Take 500 mg by mouth daily with breakfast.      . vitamin B-12 (CYANOCOBALAMIN) 1000 MCG tablet Take 1,000 mcg by mouth daily.        Review of Systems Review of Systems  Constitutional: Positive for chills and fatigue. Negative for fever and unexpected weight change.  HENT: Negative for hearing loss, congestion, sore throat, trouble swallowing and voice change.   Eyes: Negative for visual disturbance.  Respiratory: Negative for cough and wheezing.   Cardiovascular: Negative for chest pain, palpitations and leg swelling.  Gastrointestinal: Negative for nausea, vomiting, abdominal pain, diarrhea, constipation, blood in stool, abdominal distention and anal bleeding.  Genitourinary: Negative for hematuria, vaginal bleeding and difficulty urinating.       Incontinence  Musculoskeletal: Positive for back pain and arthralgias.  Skin: Negative for rash and wound.  Neurological: Negative for seizures, syncope and headaches.  Hematological: Negative for adenopathy. Does not bruise/bleed easily.       Being evaluated for possible HLA b24 abnormality  Psychiatric/Behavioral: Negative for confusion. The patient is nervous/anxious.     Blood pressure 112/78, pulse 84, temperature 97.4 F (  36.3 C), temperature source Temporal, resp. rate 16, height 5' 2" (1.575 m), weight 150 lb (68.04 kg).  Physical Exam Physical Exam  Vitals reviewed. Constitutional: She is oriented to person, place, and time. She appears well-developed and well-nourished. No distress.  HENT:  Head: Normocephalic and atraumatic.  Mouth/Throat: Oropharynx is clear and moist.  Eyes: Conjunctivae normal and EOM are normal. Pupils are equal, round, and reactive to light. No scleral icterus.  Neck: Normal range of motion. Neck supple. No tracheal deviation present. No thyromegaly present.  Cardiovascular: Normal rate, regular rhythm, normal heart sounds and  intact distal pulses.  Exam reveals no gallop and no friction rub.   No murmur heard. Pulmonary/Chest: Effort normal and breath sounds normal. No respiratory distress. She has no wheezes. She has no rales.       Breasts are symmetric and normal to inspection and palpation. They are dense. No mass  Abdominal: Soft. Bowel sounds are normal. She exhibits no distension and no mass. There is no tenderness. There is no rebound and no guarding.  Musculoskeletal: Normal range of motion. She exhibits no edema and no tenderness.  Lymphadenopathy:    She has no cervical adenopathy.    She has no axillary adenopathy.       Right: No supraclavicular adenopathy present.  Neurological: She is alert and oriented to person, place, and time.  Skin: Skin is warm and dry. No rash noted. She is not diaphoretic. No erythema.  Psychiatric: She has a normal mood and affect. Her behavior is normal. Judgment and thought content normal.    Data Reviewed I have reviewed the mammogram and MRI films and reports and the pathology slides and reports the the recommendations of the medical and radiation oncologists.  Assessment    Stage 0 left breast cancer    Plan    Right total mastectomy with implant reconstruction; left total mastectomy, sentinel lymph node biopsy, reconstruction  I had a long discussion with the patient and her husband about indication risks complications of surgery I think all questions have been answered. They will call if they have any further questions.   Geanette Buonocore J 11/16/2011, 2:39 PM    

## 2011-11-16 NOTE — Patient Instructions (Signed)
Call me if you have any questions prior to your surgery

## 2011-11-17 ENCOUNTER — Encounter (HOSPITAL_COMMUNITY)
Admission: RE | Admit: 2011-11-17 | Discharge: 2011-11-17 | Disposition: A | Discharge status: Home / Self Care | Payer: BC Managed Care – PPO | Source: Ambulatory Visit | Attending: Surgery | Admitting: Surgery

## 2011-11-17 ENCOUNTER — Encounter (HOSPITAL_COMMUNITY): Payer: Self-pay

## 2011-11-17 HISTORY — DX: Spondylosis without myelopathy or radiculopathy, site unspecified: M47.819

## 2011-11-17 LAB — CBC WITH DIFFERENTIAL/PLATELET
Basophils Absolute: 0.1 10*3/uL (ref 0.0–0.1)
Eosinophils Relative: 1 % (ref 0–5)
HCT: 40.1 % (ref 36.0–46.0)
Hemoglobin: 13.6 g/dL (ref 12.0–15.0)
Lymphocytes Relative: 28 % (ref 12–46)
Lymphs Abs: 2.7 10*3/uL (ref 0.7–4.0)
MCV: 92.2 fL (ref 78.0–100.0)
Monocytes Absolute: 0.5 10*3/uL (ref 0.1–1.0)
Monocytes Relative: 6 % (ref 3–12)
Neutro Abs: 6.3 10*3/uL (ref 1.7–7.7)
RBC: 4.35 MIL/uL (ref 3.87–5.11)
WBC: 9.7 10*3/uL (ref 4.0–10.5)

## 2011-11-17 LAB — COMPREHENSIVE METABOLIC PANEL
AST: 22 U/L (ref 0–37)
CO2: 26 mEq/L (ref 19–32)
Calcium: 9.6 mg/dL (ref 8.4–10.5)
Chloride: 100 mEq/L (ref 96–112)
Creatinine, Ser: 0.72 mg/dL (ref 0.50–1.10)
GFR calc Af Amer: >90 mL/min (ref >90)
GFR calc non Af Amer: >90 mL/min (ref >90)
Glucose, Bld: 92 mg/dL (ref 70–99)
Total Bilirubin: 0.3 mg/dL (ref 0.3–1.2)

## 2011-11-17 LAB — URINALYSIS, ROUTINE W REFLEX MICROSCOPIC
Bilirubin Urine: NEGATIVE
Hgb urine dipstick: NEGATIVE
Nitrite: NEGATIVE
Protein, ur: NEGATIVE mg/dL
Specific Gravity, Urine: 1.004 — ABNORMAL LOW (ref 1.005–1.030)
Urobilinogen, UA: 0.2 mg/dL (ref 0.0–1.0)

## 2011-11-17 LAB — SURGICAL PCR SCREEN: Staphylococcus aureus: NEGATIVE

## 2011-11-17 NOTE — Pre-Procedure Instructions (Signed)
20 Samantha Andersen  11/17/2011   Your procedure is scheduled on:  11-24-2011  Report to [facility] at 8:30 AM. TAKE EAST ELEVATORS TO 3RD FLOOR   Call this number if you have problems the morning of surgery: [phone]   Remember:   Do not eat food or drink:After Midnight.    Take these medicines the morning of surgery with A SIP OF WATER: Tylenol as needed, diazepam as needed   Do not wear jewelry, make-up or nail polish.  Do not wear lotions, powders, or perfumes.   Do not shave 48 hours prior to surgery.  Do not bring valuables to the hospital.  Contacts, dentures or bridgework may not be worn into surgery.  Leave suitcase in the car. After surgery it may be brought to your room.   For patients admitted to the hospital, checkout time is 11:00 AM the day of discharge.      Special Instructions: Incentive Spirometry - Practice and bring it with you on the day of surgery. Shower using CHG 2 nights before surgery and the night before surgery.  If you shower the day of surgery use CHG.  Use special wash - you have one bottle of CHG for all showers.  You should use approximately 1/3 of the bottle for each shower.     Please read over the following fact sheets that you were given: Pain Booklet, Coughing and Deep Breathing, MRSA Information and Surgical Site Infection Prevention

## 2011-11-17 NOTE — Progress Notes (Signed)
Pt. Is not diabetic,takes metformin for Polycystic Ovary Syndrome.

## 2011-11-23 MED ORDER — CEFAZOLIN SODIUM-DEXTROSE 2-3 GM-% IV SOLR
2.0000 g | INTRAVENOUS | Status: AC
  Administered 2011-11-24: 1 g via INTRAVENOUS
  Administered 2011-11-24: 2 g via INTRAVENOUS
  Filled 2011-11-23: qty 50

## 2011-11-24 ENCOUNTER — Encounter (HOSPITAL_COMMUNITY)
Admission: RE | Admit: 2011-11-24 | Discharge: 2011-11-24 | Disposition: A | Discharge status: Home / Self Care | Payer: BC Managed Care – PPO | Source: Ambulatory Visit | Attending: Surgery | Admitting: Surgery

## 2011-11-24 ENCOUNTER — Encounter (HOSPITAL_COMMUNITY)
Admission: RE | Disposition: A | Discharge status: Home / Self Care | Payer: Self-pay | Source: Ambulatory Visit | Attending: Surgery

## 2011-11-24 ENCOUNTER — Encounter (HOSPITAL_COMMUNITY): Payer: Self-pay | Admitting: *Deleted

## 2011-11-24 ENCOUNTER — Inpatient Hospital Stay (HOSPITAL_COMMUNITY)
Admission: RE | Admit: 2011-11-24 | Discharge: 2011-11-27 | DRG: 257 | Disposition: A | Discharge status: Home / Self Care | Payer: BC Managed Care – PPO | Source: Ambulatory Visit | Attending: Surgery | Admitting: Surgery

## 2011-11-24 ENCOUNTER — Encounter (HOSPITAL_COMMUNITY): Payer: Self-pay | Admitting: Anesthesiology

## 2011-11-24 ENCOUNTER — Ambulatory Visit (HOSPITAL_COMMUNITY): Payer: BC Managed Care – PPO | Admitting: Anesthesiology

## 2011-11-24 ENCOUNTER — Encounter (HOSPITAL_COMMUNITY): Payer: Self-pay

## 2011-11-24 DIAGNOSIS — E785 Hyperlipidemia, unspecified: Secondary | ICD-10-CM | POA: Diagnosis present

## 2011-11-24 DIAGNOSIS — Z01812 Encounter for preprocedural laboratory examination: Secondary | ICD-10-CM

## 2011-11-24 DIAGNOSIS — J988 Other specified respiratory disorders: Secondary | ICD-10-CM | POA: Diagnosis not present

## 2011-11-24 DIAGNOSIS — E119 Type 2 diabetes mellitus without complications: Secondary | ICD-10-CM | POA: Diagnosis present

## 2011-11-24 DIAGNOSIS — F411 Generalized anxiety disorder: Secondary | ICD-10-CM | POA: Diagnosis present

## 2011-11-24 DIAGNOSIS — D0590 Unspecified type of carcinoma in situ of unspecified breast: Principal | ICD-10-CM | POA: Diagnosis present

## 2011-11-24 DIAGNOSIS — J9819 Other pulmonary collapse: Secondary | ICD-10-CM | POA: Diagnosis not present

## 2011-11-24 DIAGNOSIS — E282 Polycystic ovarian syndrome: Secondary | ICD-10-CM | POA: Diagnosis present

## 2011-11-24 DIAGNOSIS — C50419 Malignant neoplasm of upper-outer quadrant of unspecified female breast: Secondary | ICD-10-CM

## 2011-11-24 DIAGNOSIS — C50912 Malignant neoplasm of unspecified site of left female breast: Secondary | ICD-10-CM

## 2011-11-24 DIAGNOSIS — Z87891 Personal history of nicotine dependence: Secondary | ICD-10-CM

## 2011-11-24 DIAGNOSIS — Z4001 Encounter for prophylactic removal of breast: Secondary | ICD-10-CM

## 2011-11-24 DIAGNOSIS — C50919 Malignant neoplasm of unspecified site of unspecified female breast: Secondary | ICD-10-CM | POA: Insufficient documentation

## 2011-11-24 DIAGNOSIS — Y836 Removal of other organ (partial) (total) as the cause of abnormal reaction of the patient, or of later complication, without mention of misadventure at the time of the procedure: Secondary | ICD-10-CM | POA: Diagnosis not present

## 2011-11-24 DIAGNOSIS — N6089 Other benign mammary dysplasias of unspecified breast: Secondary | ICD-10-CM

## 2011-11-24 HISTORY — PX: SIMPLE MASTECTOMY WITH AXILLARY SENTINEL NODE BIOPSY: SHX6098

## 2011-11-24 HISTORY — PX: TISSUE EXPANDER PLACEMENT: SHX2530

## 2011-11-24 HISTORY — DX: Adverse effect of unspecified anesthetic, initial encounter: T41.45XA

## 2011-11-24 HISTORY — DX: Other complications of anesthesia, initial encounter: T88.59XA

## 2011-11-24 HISTORY — DX: Nausea with vomiting, unspecified: R11.2

## 2011-11-24 HISTORY — DX: Other specified postprocedural states: Z98.890

## 2011-11-24 HISTORY — DX: Other specified postprocedural states: R11.2

## 2011-11-24 SURGERY — SIMPLE MASTECTOMY WITH AXILLARY SENTINEL NODE BIOPSY
Anesthesia: General | Site: Breast | Laterality: Right | Wound class: Clean

## 2011-11-24 MED ORDER — ONDANSETRON HCL 4 MG/2ML IJ SOLN
INTRAMUSCULAR | Status: DC | PRN
  Administered 2011-11-24: 4 mg via INTRAVENOUS

## 2011-11-24 MED ORDER — DEXTROSE-NACL 5-0.45 % IV SOLN
INTRAVENOUS | Status: DC
  Administered 2011-11-24 – 2011-11-25 (×2): via INTRAVENOUS

## 2011-11-24 MED ORDER — SCOPOLAMINE 1 MG/3DAYS TD PT72
1.0000 | MEDICATED_PATCH | TRANSDERMAL | Status: DC
  Administered 2011-11-24: 1.5 mg via TRANSDERMAL
  Filled 2011-11-24: qty 1

## 2011-11-24 MED ORDER — TECHNETIUM TC 99M SULFUR COLLOID FILTERED: 1.0000 | Freq: Once | INTRAVENOUS | Status: AC | PRN

## 2011-11-24 MED ORDER — MIDAZOLAM HCL 5 MG/5ML IJ SOLN
INTRAMUSCULAR | Status: DC | PRN
  Administered 2011-11-24: 2 mg via INTRAVENOUS

## 2011-11-24 MED ORDER — DIPHENHYDRAMINE HCL 50 MG/ML IJ SOLN: 12.5000 mg | Freq: Four times a day (QID) | INTRAMUSCULAR | Status: DC | PRN

## 2011-11-24 MED ORDER — SODIUM CHLORIDE 0.9 % IR SOLN
Status: DC | PRN
  Administered 2011-11-24 (×2)

## 2011-11-24 MED ORDER — LACTATED RINGERS IV SOLN
INTRAVENOUS | Status: DC | PRN
  Administered 2011-11-24 (×4): via INTRAVENOUS

## 2011-11-24 MED ORDER — DOCUSATE SODIUM 100 MG PO CAPS
100.0000 mg | ORAL_CAPSULE | Freq: Every day | ORAL | Status: DC
  Administered 2011-11-25 – 2011-11-27 (×3): 100 mg via ORAL
  Filled 2011-11-24 (×4): qty 1

## 2011-11-24 MED ORDER — HYDROMORPHONE 0.3 MG/ML IV SOLN
INTRAVENOUS | Status: DC
  Administered 2011-11-24: 23 mL via INTRAVENOUS
  Administered 2011-11-24: 2.89 mg via INTRAVENOUS
  Administered 2011-11-25: 3.99 mg via INTRAVENOUS
  Administered 2011-11-25: 01:00:00 via INTRAVENOUS
  Administered 2011-11-25: 1.79 mg via INTRAVENOUS
  Administered 2011-11-25: 2.59 mg via INTRAVENOUS
  Filled 2011-11-24: qty 25

## 2011-11-24 MED ORDER — SODIUM CHLORIDE 0.9 % IJ SOLN: 9.0000 mL | INTRAMUSCULAR | Status: DC | PRN

## 2011-11-24 MED ORDER — METHOCARBAMOL 500 MG PO TABS: 500.0000 mg | ORAL_TABLET | Freq: Four times a day (QID) | ORAL | Status: DC | PRN

## 2011-11-24 MED ORDER — LACTATED RINGERS IV SOLN
INTRAVENOUS | Status: DC
  Administered 2011-11-24: 11:00:00 via INTRAVENOUS

## 2011-11-24 MED ORDER — HYDROMORPHONE HCL PF 1 MG/ML IJ SOLN
INTRAMUSCULAR | Status: AC
  Filled 2011-11-24: qty 1

## 2011-11-24 MED ORDER — NEOSTIGMINE METHYLSULFATE 1 MG/ML IJ SOLN
INTRAMUSCULAR | Status: DC | PRN
  Administered 2011-11-24: 3 mg via INTRAVENOUS

## 2011-11-24 MED ORDER — HYDROMORPHONE HCL PF 1 MG/ML IJ SOLN
0.2500 mg | INTRAMUSCULAR | Status: DC | PRN
  Administered 2011-11-24 (×4): 0.5 mg via INTRAVENOUS

## 2011-11-24 MED ORDER — HYDROMORPHONE 0.3 MG/ML IV SOLN
INTRAVENOUS | Status: AC
  Filled 2011-11-24: qty 25

## 2011-11-24 MED ORDER — METHYLENE BLUE 1 % INJ SOLN
INTRAMUSCULAR | Status: AC
  Filled 2011-11-24: qty 10

## 2011-11-24 MED ORDER — EPHEDRINE SULFATE 50 MG/ML IJ SOLN
INTRAMUSCULAR | Status: DC | PRN
  Administered 2011-11-24: 10 mg via INTRAVENOUS

## 2011-11-24 MED ORDER — MIDAZOLAM HCL 2 MG/2ML IJ SOLN
INTRAMUSCULAR | Status: AC
  Filled 2011-11-24: qty 2

## 2011-11-24 MED ORDER — NALOXONE HCL 0.4 MG/ML IJ SOLN: 0.4000 mg | INTRAMUSCULAR | Status: DC | PRN

## 2011-11-24 MED ORDER — CEFAZOLIN SODIUM 1-5 GM-% IV SOLN
1.0000 g | Freq: Three times a day (TID) | INTRAVENOUS | Status: DC
  Administered 2011-11-24 – 2011-11-27 (×8): 1 g via INTRAVENOUS
  Filled 2011-11-24 (×10): qty 50

## 2011-11-24 MED ORDER — CEFAZOLIN SODIUM 1-5 GM-% IV SOLN
INTRAVENOUS | Status: AC
  Filled 2011-11-24: qty 50

## 2011-11-24 MED ORDER — SODIUM CHLORIDE 0.9 % IJ SOLN
INTRAMUSCULAR | Status: DC | PRN
  Administered 2011-11-24: 11:00:00 via INTRAMUSCULAR

## 2011-11-24 MED ORDER — ONDANSETRON HCL 4 MG/2ML IJ SOLN
4.0000 mg | Freq: Four times a day (QID) | INTRAMUSCULAR | Status: DC | PRN
  Administered 2011-11-24: 4 mg via INTRAVENOUS
  Filled 2011-11-24: qty 2

## 2011-11-24 MED ORDER — SODIUM CHLORIDE 0.9 % IV SOLN
INTRAVENOUS | Status: DC | PRN
  Administered 2011-11-24: 150 mL via INTRAMUSCULAR

## 2011-11-24 MED ORDER — ROCURONIUM BROMIDE 100 MG/10ML IV SOLN
INTRAVENOUS | Status: DC | PRN
  Administered 2011-11-24 (×2): 10 mg via INTRAVENOUS
  Administered 2011-11-24 (×2): 5 mg via INTRAVENOUS
  Administered 2011-11-24: 30 mg via INTRAVENOUS
  Administered 2011-11-24: 50 mg via INTRAVENOUS
  Administered 2011-11-24: 10 mg via INTRAVENOUS

## 2011-11-24 MED ORDER — GLYCOPYRROLATE 0.2 MG/ML IJ SOLN
INTRAMUSCULAR | Status: DC | PRN
  Administered 2011-11-24: .3 mg via INTRAVENOUS

## 2011-11-24 MED ORDER — FENTANYL CITRATE 0.05 MG/ML IJ SOLN
INTRAMUSCULAR | Status: AC
  Filled 2011-11-24: qty 2

## 2011-11-24 MED ORDER — CHLORHEXIDINE GLUCONATE 4 % EX LIQD: 1.0000 "application " | Freq: Once | CUTANEOUS | Status: DC

## 2011-11-24 MED ORDER — SCOPOLAMINE 1 MG/3DAYS TD PT72
MEDICATED_PATCH | TRANSDERMAL | Status: AC
  Filled 2011-11-24: qty 1

## 2011-11-24 MED ORDER — ONDANSETRON HCL 4 MG/2ML IJ SOLN
INTRAMUSCULAR | Status: AC
  Filled 2011-11-24: qty 2

## 2011-11-24 MED ORDER — BUPIVACAINE HCL (PF) 0.25 % IJ SOLN
INTRAMUSCULAR | Status: AC
  Filled 2011-11-24: qty 30

## 2011-11-24 MED ORDER — PROPOFOL 10 MG/ML IV BOLUS
INTRAVENOUS | Status: DC | PRN
  Administered 2011-11-24: 120 mg via INTRAVENOUS

## 2011-11-24 MED ORDER — METFORMIN HCL ER 500 MG PO TB24
500.0000 mg | ORAL_TABLET | Freq: Every day | ORAL | Status: DC
  Filled 2011-11-24 (×4): qty 1

## 2011-11-24 MED ORDER — ONDANSETRON HCL 4 MG/2ML IJ SOLN
4.0000 mg | Freq: Once | INTRAMUSCULAR | Status: AC | PRN
  Administered 2011-11-24: 4 mg via INTRAVENOUS

## 2011-11-24 MED ORDER — ARTIFICIAL TEARS OP OINT
TOPICAL_OINTMENT | OPHTHALMIC | Status: DC | PRN
  Administered 2011-11-24: 1 via OPHTHALMIC

## 2011-11-24 MED ORDER — DIPHENHYDRAMINE HCL 12.5 MG/5ML PO ELIX
12.5000 mg | ORAL_SOLUTION | Freq: Four times a day (QID) | ORAL | Status: DC | PRN
  Filled 2011-11-24: qty 5

## 2011-11-24 MED ORDER — LIDOCAINE HCL (CARDIAC) 20 MG/ML IV SOLN
INTRAVENOUS | Status: DC | PRN
  Administered 2011-11-24: 100 mg via INTRAVENOUS

## 2011-11-24 MED ORDER — FENTANYL CITRATE 0.05 MG/ML IJ SOLN
INTRAMUSCULAR | Status: DC | PRN
  Administered 2011-11-24 (×5): 50 ug via INTRAVENOUS
  Administered 2011-11-24 (×2): 100 ug via INTRAVENOUS
  Administered 2011-11-24 (×3): 50 ug via INTRAVENOUS

## 2011-11-24 MED ORDER — BUPIVACAINE HCL (PF) 0.25 % IJ SOLN
INTRAMUSCULAR | Status: DC | PRN
  Administered 2011-11-24 (×2): 30 mL

## 2011-11-24 MED ORDER — ALBUMIN HUMAN 5 % IV SOLN
INTRAVENOUS | Status: DC | PRN
  Administered 2011-11-24 (×2): via INTRAVENOUS

## 2011-11-24 MED ORDER — SODIUM CHLORIDE 0.9 % IR SOLN
Status: DC | PRN
  Administered 2011-11-24 (×3): 1

## 2011-11-24 MED ORDER — MIDAZOLAM HCL 2 MG/2ML IJ SOLN
1.0000 mg | Freq: Once | INTRAMUSCULAR | Status: AC
  Administered 2011-11-24: 1 mg via INTRAVENOUS

## 2011-11-24 MED ORDER — PROMETHAZINE HCL 25 MG/ML IJ SOLN
6.2500 mg | INTRAMUSCULAR | Status: DC | PRN
  Filled 2011-11-24: qty 1

## 2011-11-24 SURGICAL SUPPLY — 89 items
APPLIER CLIP 9.375 MED OPEN (MISCELLANEOUS) ×4
APPLIER CLIP 9.375 SM OPEN (CLIP)
ATCH SMKEVC FLXB CAUT HNDSWH (FILTER) ×3 IMPLANT
BAG DECANTER FOR FLEXI CONT (MISCELLANEOUS) ×12 IMPLANT
BINDER BREAST LRG (GAUZE/BANDAGES/DRESSINGS) ×4 IMPLANT
BINDER BREAST XLRG (GAUZE/BANDAGES/DRESSINGS) IMPLANT
BIOPATCH RED 1 DISK 7.0 (GAUZE/BANDAGES/DRESSINGS) ×8 IMPLANT
BLADE SURG 10 STRL SS (BLADE) ×4 IMPLANT
BLADE SURG 15 STRL LF DISP TIS (BLADE) ×3 IMPLANT
BLADE SURG 15 STRL SS (BLADE) ×1
CANISTER SUCTION 2500CC (MISCELLANEOUS) ×4 IMPLANT
CHLORAPREP W/TINT 26ML (MISCELLANEOUS) ×4 IMPLANT
CLIP APPLIE 9.375 MED OPEN (MISCELLANEOUS) ×3 IMPLANT
CLIP APPLIE 9.375 SM OPEN (CLIP) IMPLANT
CLOTH BEACON ORANGE TIMEOUT ST (SAFETY) ×8 IMPLANT
CONT SPEC 4OZ CLIKSEAL STRL BL (MISCELLANEOUS) ×12 IMPLANT
COVER MAYO STAND STRL (DRAPES) ×4 IMPLANT
COVER PROBE W GEL 5X96 (DRAPES) ×4 IMPLANT
COVER SURGICAL LIGHT HANDLE (MISCELLANEOUS) ×12 IMPLANT
DERMABOND ADHESIVE PROPEN (GAUZE/BANDAGES/DRESSINGS) ×1
DERMABOND ADVANCED (GAUZE/BANDAGES/DRESSINGS) ×2
DERMABOND ADVANCED .7 DNX12 (GAUZE/BANDAGES/DRESSINGS) ×6 IMPLANT
DERMABOND ADVANCED .7 DNX6 (GAUZE/BANDAGES/DRESSINGS) ×3 IMPLANT
DRAIN CHANNEL 19F RND (DRAIN) ×16 IMPLANT
DRAPE LAPAROSCOPIC ABDOMINAL (DRAPES) IMPLANT
DRAPE ORTHO SPLIT 77X108 STRL (DRAPES) ×4
DRAPE PROXIMA HALF (DRAPES) ×12 IMPLANT
DRAPE SURG 17X23 STRL (DRAPES) ×16 IMPLANT
DRAPE SURG ORHT 6 SPLT 77X108 (DRAPES) ×12 IMPLANT
DRAPE UTILITY 15X26 W/TAPE STR (DRAPE) ×8 IMPLANT
DRAPE WARM FLUID 44X44 (DRAPE) ×4 IMPLANT
DRSG PAD ABDOMINAL 8X10 ST (GAUZE/BANDAGES/DRESSINGS) ×4 IMPLANT
DRSG TEGADERM 4X4.75 (GAUZE/BANDAGES/DRESSINGS) IMPLANT
ELECT BLADE 4.0 EZ CLEAN MEGAD (MISCELLANEOUS) ×8
ELECT BLADE 6.5 EXT (BLADE) IMPLANT
ELECT CAUTERY BLADE 6.4 (BLADE) ×8 IMPLANT
ELECT REM PT RETURN 9FT ADLT (ELECTROSURGICAL) ×4
ELECTRODE BLDE 4.0 EZ CLN MEGD (MISCELLANEOUS) ×6 IMPLANT
ELECTRODE REM PT RTRN 9FT ADLT (ELECTROSURGICAL) ×3 IMPLANT
EVACUATOR SILICONE 100CC (DRAIN) ×16 IMPLANT
EVACUATOR SMOKE ACCUVAC VALLEY (FILTER) ×1
GLOVE BIO SURGEON STRL SZ7.5 (GLOVE) ×8 IMPLANT
GLOVE BIO SURGEON STRL SZ8 (GLOVE) ×8 IMPLANT
GLOVE BIOGEL PI IND STRL 6.5 (GLOVE) ×3 IMPLANT
GLOVE BIOGEL PI IND STRL 8 (GLOVE) ×12 IMPLANT
GLOVE BIOGEL PI INDICATOR 6.5 (GLOVE) ×1
GLOVE BIOGEL PI INDICATOR 8 (GLOVE) ×4
GLOVE EUDERMIC 7 POWDERFREE (GLOVE) ×8 IMPLANT
GLOVE SURG ORTHO 8.0 STRL STRW (GLOVE) ×4 IMPLANT
GLOVE SURG SS PI 6.0 STRL IVOR (GLOVE) ×4 IMPLANT
GLOVE SURG SS PI 6.5 STRL IVOR (GLOVE) ×8 IMPLANT
GLOVE SURG SS PI 7.0 STRL IVOR (GLOVE) ×4 IMPLANT
GOWN PREVENTION PLUS XLARGE (GOWN DISPOSABLE) ×12 IMPLANT
GOWN STRL NON-REIN LRG LVL3 (GOWN DISPOSABLE) ×20 IMPLANT
KIT BASIN OR (CUSTOM PROCEDURE TRAY) ×8 IMPLANT
KIT ROOM TURNOVER OR (KITS) ×4 IMPLANT
MARKER SKIN DUAL TIP RULER LAB (MISCELLANEOUS) ×4 IMPLANT
NEEDLE 18GX1X1/2 (RX/OR ONLY) (NEEDLE) ×4 IMPLANT
NEEDLE HYPO 25GX1X1/2 BEV (NEEDLE) ×4 IMPLANT
NEEDLE SPNL 22GX3.5 QUINCKE BK (NEEDLE) ×8 IMPLANT
NS IRRIG 1000ML POUR BTL (IV SOLUTION) ×12 IMPLANT
PACK GENERAL/GYN (CUSTOM PROCEDURE TRAY) ×8 IMPLANT
PAD ARMBOARD 7.5X6 YLW CONV (MISCELLANEOUS) ×4 IMPLANT
PEN SKIN MARKING BROAD (MISCELLANEOUS) IMPLANT
PENCIL BUTTON HOLSTER BLD 10FT (ELECTRODE) ×4 IMPLANT
PREFILTER EVAC NS 1 1/3-3/8IN (MISCELLANEOUS) ×4 IMPLANT
SET ASEPTIC TRANSFER (MISCELLANEOUS) ×8 IMPLANT
SPECIMEN JAR X LARGE (MISCELLANEOUS) ×8 IMPLANT
SPONGE GAUZE 4X4 12PLY (GAUZE/BANDAGES/DRESSINGS) IMPLANT
SPONGE INTESTINAL PEANUT (DISPOSABLE) IMPLANT
SPONGE LAP 18X18 X RAY DECT (DISPOSABLE) ×4 IMPLANT
SPONGE LAP 4X18 X RAY DECT (DISPOSABLE) ×4 IMPLANT
STAPLER VISISTAT 35W (STAPLE) ×4 IMPLANT
SUT ETHILON 2 0 FS 18 (SUTURE) IMPLANT
SUT ETHILON 3 0 FSL (SUTURE) IMPLANT
SUT MNCRL AB 3-0 PS2 18 (SUTURE) ×20 IMPLANT
SUT MON AB 4-0 PC3 18 (SUTURE) IMPLANT
SUT PDS AB 3-0 SH 27 (SUTURE) IMPLANT
SUT PROLENE 3 0 PS 2 (SUTURE) ×16 IMPLANT
SUT VIC AB 3-0 SH 18 (SUTURE) ×8 IMPLANT
SUT VIC AB 3-0 SH 8-18 (SUTURE) ×8 IMPLANT
SYR 50ML LL SCALE MARK (SYRINGE) ×8 IMPLANT
SYR BULB IRRIGATION 50ML (SYRINGE) ×8 IMPLANT
SYR CONTROL 10ML LL (SYRINGE) ×8 IMPLANT
TISSUE EXPANDER 550CC MED H (Prosthesis & Implant Plastic) ×8 IMPLANT
TOWEL OR 17X24 6PK STRL BLUE (TOWEL DISPOSABLE) ×16 IMPLANT
TOWEL OR 17X26 10 PK STRL BLUE (TOWEL DISPOSABLE) ×8 IMPLANT
TRAY FOLEY CATH 14FRSI W/METER (CATHETERS) ×4 IMPLANT
TUBE CONNECTING 12X1/4 (SUCTIONS) ×8 IMPLANT

## 2011-11-24 NOTE — Progress Notes (Signed)
Pt. reports allergy to squash- shortness of breath.

## 2011-11-24 NOTE — Progress Notes (Signed)
Report to Dr. Michelle Piper, regarding previous surgery; relative to anesth. ,  done in [place].

## 2011-11-24 NOTE — Progress Notes (Signed)
Report to Dr. Michelle Piper, regarding past anesth. experience with needing more medicine & also having N&V. Pt. Remarks that she needs glucose in her IV due to taking Metformin for PCOS- last dose taken at 2300 11/23/2011.

## 2011-11-24 NOTE — H&P (View-Only) (Signed)
Patient ID: Samantha Andersen, female   DOB: 04/10/1965, 47 y.o.   MRN: [id number]  Chief Complaint  Patient presents with  . Pre-op Exam    bil maste sched 11/24/11    HPI Samantha Andersen is a 46 y.o. female.  She was seen a few weeks ago and further evaluation done. 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 A suspicious area in the right breast was biopsied but was benign. Nevertheless, after reviewing her available options she is going to proceed to bilateral mastectomies with reconstructions. We plan sentinel lymph node on the right side. She came in today for preoperative evaluation and discussions. She's had no intercurrent illnesses since she was seen in the breast clinic a few weeks ago.  Past Medical History  Diagnosis Date  . PCOS (polycystic ovarian syndrome)   . Anemia   . Hyperlipidemia   . Anxiety   . Hot flashes   . Joint pain   . Diabetes mellitus     Gestattional  . Breast cancer     left    Past Surgical History  Procedure Date  . Abdominal hysterectomy   . Cesarean section     x2    Family History  Problem Relation Age of Onset  . Lung cancer Maternal Grandfather   . Hemochromatosis Sister   . Prostate cancer Paternal Uncle 13  . Cancer Paternal Uncle 38    unknown type of cancer    Social History History  Substance Use Topics  . Smoking status: Former Smoker -- 2.0 packs/day for 4 years    Quit date: 10/04/1985  . Smokeless tobacco: Not on file  . Alcohol Use: Yes     Comment: occ.    Allergies  Allergen Reactions  . Doxycycline Shortness Of Breath  . Tetracyclines & Related Shortness Of Breath and Other (See Comments)    Lethargy   . Ciprofloxacin     unknown    Current Outpatient Prescriptions  Medication Sig Dispense Refill  . acetaminophen (TYLENOL) 500 MG tablet Take 500 mg by mouth every 6 (six) hours as needed. For pain      . diazepam (VALIUM) 2 MG tablet Take 2 mg by mouth every 6 (six) hours as  needed. For anxiety      . metFORMIN (GLUCOPHAGE-XR) 500 MG 24 hr tablet Take 500 mg by mouth daily with breakfast.      . vitamin B-12 (CYANOCOBALAMIN) 1000 MCG tablet Take 1,000 mcg by mouth daily.        Review of Systems Review of Systems  Constitutional: Positive for chills and fatigue. Negative for fever and unexpected weight change.  HENT: Negative for hearing loss, congestion, sore throat, trouble swallowing and voice change.   Eyes: Negative for visual disturbance.  Respiratory: Negative for cough and wheezing.   Cardiovascular: Negative for chest pain, palpitations and leg swelling.  Gastrointestinal: Negative for nausea, vomiting, abdominal pain, diarrhea, constipation, blood in stool, abdominal distention and anal bleeding.  Genitourinary: Negative for hematuria, vaginal bleeding and difficulty urinating.       Incontinence  Musculoskeletal: Positive for back pain and arthralgias.  Skin: Negative for rash and wound.  Neurological: Negative for seizures, syncope and headaches.  Hematological: Negative for adenopathy. Does not bruise/bleed easily.       Being evaluated for possible HLA b24 abnormality  Psychiatric/Behavioral: Negative for confusion. The patient is nervous/anxious.     Blood pressure 112/78, pulse 84, temperature 97.4 F (  36.3 C), temperature source Temporal, resp. rate 16, height 5\' 2"  (1.575 m), weight 150 lb (68.04 kg).  Physical Exam Physical Exam  Vitals reviewed. Constitutional: She is oriented to person, place, and time. She appears well-developed and well-nourished. No distress.  HENT:  Head: Normocephalic and atraumatic.  Mouth/Throat: Oropharynx is clear and moist.  Eyes: Conjunctivae normal and EOM are normal. Pupils are equal, round, and reactive to light. No scleral icterus.  Neck: Normal range of motion. Neck supple. No tracheal deviation present. No thyromegaly present.  Cardiovascular: Normal rate, regular rhythm, normal heart sounds and  intact distal pulses.  Exam reveals no gallop and no friction rub.   No murmur heard. Pulmonary/Chest: Effort normal and breath sounds normal. No respiratory distress. She has no wheezes. She has no rales.       Breasts are symmetric and normal to inspection and palpation. They are dense. No mass  Abdominal: Soft. Bowel sounds are normal. She exhibits no distension and no mass. There is no tenderness. There is no rebound and no guarding.  Musculoskeletal: Normal range of motion. She exhibits no edema and no tenderness.  Lymphadenopathy:    She has no cervical adenopathy.    She has no axillary adenopathy.       Right: No supraclavicular adenopathy present.  Neurological: She is alert and oriented to person, place, and time.  Skin: Skin is warm and dry. No rash noted. She is not diaphoretic. No erythema.  Psychiatric: She has a normal mood and affect. Her behavior is normal. Judgment and thought content normal.    Data Reviewed I have reviewed the mammogram and MRI films and reports and the pathology slides and reports the the recommendations of the medical and radiation oncologists.  Assessment    Stage 0 left breast cancer    Plan    Right total mastectomy with implant reconstruction; left total mastectomy, sentinel lymph node biopsy, reconstruction  I had a long discussion with the patient and her husband about indication risks complications of surgery I think all questions have been answered. They will call if they have any further questions.   Samantha Andersen J 11/16/2011, 2:39 PM

## 2011-11-24 NOTE — OR Nursing (Signed)
Called pathology to confirm three touchprep specimens had been sent at 12:41.  Oralia Manis, RN

## 2011-11-24 NOTE — Transfer of Care (Signed)
Immediate Anesthesia Transfer of Care Note  Patient: Samantha Andersen  Procedure(s) Performed: Procedure(s) (LRB) with comments: SIMPLE MASTECTOMY WITH AXILLARY SENTINEL NODE BIOPSY (Left) - Bilateral Total Mastectomy and Left Sentinel Node SIMPLE MASTECTOMY (Right) TISSUE EXPANDER (Bilateral)  Patient Location: PACU  Anesthesia Type:General  Level of Consciousness: awake, sedated and patient cooperative  Airway & Oxygen Therapy: Patient Spontanous Breathing and Patient connected to face mask oxygen  Post-op Assessment: Report given to PACU RN, Post -op Vital signs reviewed and stable and Patient moving all extremities  Post vital signs: Reviewed and stable  Complications: No apparent anesthesia complications

## 2011-11-24 NOTE — Anesthesia Procedure Notes (Signed)
Procedure Name: Intubation Date/Time: 11/24/2011 11:18 AM Performed by: Fransisca Kaufmann Pre-anesthesia Checklist: Patient identified, Timeout performed, Emergency Drugs available, Suction available and Patient being monitored Patient Re-evaluated:Patient Re-evaluated prior to inductionOxygen Delivery Method: Circle system utilized Preoxygenation: Pre-oxygenation with 100% oxygen Intubation Type: IV induction Ventilation: Mask ventilation without difficulty Laryngoscope Size: Miller and 2 Grade View: Grade I Tube type: Oral Tube size: 7.0 mm Number of attempts: 1 Airway Equipment and Method: Stylet Placement Confirmation: ETT inserted through vocal cords under direct vision,  breath sounds checked- equal and bilateral and positive ETCO2 Secured at: 21 cm Tube secured with: Tape Dental Injury: Teeth and Oropharynx as per pre-operative assessment

## 2011-11-24 NOTE — OR Nursing (Signed)
Pathology noted that the left breast weighed 556 grams and the right breast weighed 464 grams.  Oralia Manis, RN

## 2011-11-24 NOTE — Progress Notes (Signed)
DR. Fonnie Mu at bedside , speaking with patient , pt states she had valium ordered when she was diagnosed with cancer. She gets nervous in these situaations

## 2011-11-24 NOTE — Anesthesia Preprocedure Evaluation (Signed)
Anesthesia Evaluation  Patient identified by MRN, date of birth, ID band Patient awake    Reviewed: Allergy & Precautions, H&P , NPO status , Patient's Chart, lab work & pertinent test results  History of Anesthesia Complications (+) PONV  Airway Mallampati: I TM Distance: >3 FB Neck ROM: Full    Dental   Pulmonary          Cardiovascular     Neuro/Psych    GI/Hepatic   Endo/Other    Renal/GU      Musculoskeletal   Abdominal   Peds  Hematology   Anesthesia Other Findings   Reproductive/Obstetrics                           Anesthesia Physical Anesthesia Plan  ASA: II  Anesthesia Plan: General   Post-op Pain Management:    Induction: Intravenous  Airway Management Planned: Oral ETT  Additional Equipment:   Intra-op Plan:   Post-operative Plan: Extubation in OR  Informed Consent: I have reviewed the patients History and Physical, chart, labs and discussed the procedure including the risks, benefits and alternatives for the proposed anesthesia with the patient or authorized representative who has indicated his/her understanding and acceptance.     Plan Discussed with: CRNA and Surgeon  Anesthesia Plan Comments:         Anesthesia Quick Evaluation  

## 2011-11-24 NOTE — Progress Notes (Signed)
Gave patient a bolus of PC

## 2011-11-24 NOTE — Anesthesia Postprocedure Evaluation (Signed)
  Anesthesia Post-op Note  Patient: Samantha Andersen  Procedure(s) Performed: Procedure(s) (LRB) with comments: SIMPLE MASTECTOMY WITH AXILLARY SENTINEL NODE BIOPSY (Left) - Bilateral Total Mastectomy and Left Sentinel Node SIMPLE MASTECTOMY (Right) TISSUE EXPANDER (Bilateral)  Patient Location: PACU  Anesthesia Type:General  Level of Consciousness: awake and alert   Airway and Oxygen Therapy: Patient Spontanous Breathing and Patient connected to nasal cannula oxygen  Post-op Pain: moderate  Post-op Assessment: Post-op Vital signs reviewed, Patient's Cardiovascular Status Stable, Respiratory Function Stable, Patent Airway and No signs of Nausea or vomiting  Post-op Vital Signs: Reviewed and stable  Complications: No apparent anesthesia complications

## 2011-11-24 NOTE — Progress Notes (Signed)
Dr. Sampson Goon feels patient does not need telemetry Given versed

## 2011-11-24 NOTE — Interval H&P Note (Signed)
History and Physical Interval Note:  11/24/2011 10:15 AM  Samantha Andersen  has presented today for surgery, with the diagnosis of left breast cancer  The various methods of treatment have been discussed with the patient and family. After consideration of risks, benefits and other options for treatment, the patient has consented to  Procedure(s) (LRB) with comments: SIMPLE MASTECTOMY WITH AXILLARY SENTINEL NODE BIOPSY (Left) - bilateral total mastectomy and left sentinel node SIMPLE MASTECTOMY (Right) TISSUE EXPANDER (Bilateral) as a surgical intervention .  The patient's history has been reviewed, patient examined, no change in status, stable for surgery.  I have reviewed the patient's chart and labs.  Questions were answered to the patient's satisfaction.   The left breast is marked as the side for the sentinel nodes  Samantha Andersen

## 2011-11-24 NOTE — Op Note (Signed)
Samantha Andersen [birth year] [id number] 11/03/2011  Preoperative diagnosis: DCIS, left breast  Postoperative diagnosis: same  Procedure: left total mastectomy with the dye injection and axillary sentinel lymph node dissection; right total (prophylactic) mastectomy  Surgeon: Currie Paris, MD, FACS  Assistant: none  Anesthesia: General   Clinical History and Indications: this patient has presented with a fairly diffuse area DCIS and what turned out to be a benign area in the right breast that was abnormal on diagnostic studies. She elected to have bilateral total mastectomies with subtle motor evaluation. She is scheduled for immediate reconstruction by the plastic surgeon.    Description of Procedure: I saw the patient the preoperative area and confirmed the plans with her, reviewed her chart, and marked the left breast as the side for the sentinel lymph node.  The patient was taken to the operating room and after satisfactory general endotracheal anesthesia was obtained Foley catheter was placed in a time out was done. I injected 5 cc of dilute methylene blue around the left areolar complex and massaged in. A full prep and drape was then done and a second timeout performed.  I marked a hot area in the axilla as the area for the sentinel lymph node using the neoprobe. I made an elliptical incision and raised the usual skin flaps to the clavicles sternum inframammary fold and latissimus. After the incisions was made I used approximately 100 cc of a mixture of saline and Marcaine infiltrated in the subcutaneous tissues to help with the tissue planes. Once the flaps were raised the breast was removed using cautery starting medially and working laterally and disconnecting it from the lateral chest wall.  Using the neoprobe I found a hot area in the axilla and opened the clavipectoral fascia to identify it and found a cluster of 3 lymph nodes that were all hot and blue. These were removed  there were no other hot areas and no other blue areas and no palpably abnormal nodes. The pathologist reported that these nodes were negative.  I irrigated in nature everything was dry. I placed a laparotomy packing was moistened with antibiotic solution and covered the wound.  Using new instruments gloves etc. I approached the right side. I made a similar incision but very little scan but did include the nipple areolar complex. I made similar skin flaps and removed the breast, leaving the fascia. I did not enter the axilla. There were no palpable abnormal lymph nodes in the axilla.I irrigated and made sure everything was dry. At this point Dr. Etter Sjogren entered to perform the reconstructions.  The patient tolerated my portion of the procedure well. Estimated blood loss was 100 cc. No complications occurred.  Currie Paris, MD, FACS 11/24/2011 1:47 PM

## 2011-11-24 NOTE — Preoperative (Signed)
Beta Blockers   Reason not to administer Beta Blockers:Not Applicable 

## 2011-11-24 NOTE — Brief Op Note (Signed)
11/24/2011  4:33 PM  PATIENT:  Samantha Andersen  46 y.o. female  PRE-OPERATIVE DIAGNOSIS:  Left Breast Cancer  POST-OPERATIVE DIAGNOSIS:  Left Breast Cancer  PROCEDURE:  Procedure(s) (LRB) with comments: SIMPLE MASTECTOMY WITH AXILLARY SENTINEL NODE BIOPSY (Left) - Bilateral Total Mastectomy and Left Sentinel Node SIMPLE MASTECTOMY (Right) TISSUE EXPANDER (Bilateral)  SURGEON:  Surgeon(s) and Role: Panel 1:    * Christian Leta Jungling, MD - Primary  Panel 2:    * Etter Sjogren, MD - Primary  PHYSICIAN ASSISTANT:   ASSISTANTS: none   ANESTHESIA:   general  EBL:  Total I/O In: 2900 [I.V.:2400; IV Piggyback:500] Out: 725 [Urine:400; Blood:325]  BLOOD ADMINISTERED:none  DRAINS: (4) Jackson-Pratt drain(s) with closed bulb suction in the left chest (2) and right chest (2)   LOCAL MEDICATIONS USED:  NONE  SPECIMEN:  No Specimen  DISPOSITION OF SPECIMEN:  N/A  COUNTS:  YES  TOURNIQUET:  * No tourniquets in log *  DICTATION: .Other Dictation: Dictation Number [id number]  PLAN OF CARE: Admit to inpatient   PATIENT DISPOSITION:  PACU - hemodynamically stable.   Delay start of Pharmacological VTE agent (>24hrs) due to surgical blood loss or risk of bleeding: yes

## 2011-11-24 NOTE — Progress Notes (Signed)
Dr. Sampson Goon informed of patient's heart rate goes up to 130"s and back down in a sinus tack no heart history, using PCA patient states that after her hysterectomy she had tachycardia, a fast rate with no explanation

## 2011-11-24 NOTE — Progress Notes (Signed)
Spoke with Dr. Sampson Goon would like patient on telementry overnite for sinus tach

## 2011-11-25 ENCOUNTER — Encounter (HOSPITAL_COMMUNITY): Payer: Self-pay | Admitting: Surgery

## 2011-11-25 MED ORDER — HEPARIN SODIUM (PORCINE) 5000 UNIT/ML IJ SOLN
5000.0000 [IU] | Freq: Three times a day (TID) | INTRAMUSCULAR | Status: DC
  Administered 2011-11-25 – 2011-11-27 (×6): 5000 [IU] via SUBCUTANEOUS
  Filled 2011-11-25 (×9): qty 1

## 2011-11-25 MED ORDER — ACETAMINOPHEN 325 MG PO TABS
650.0000 mg | ORAL_TABLET | Freq: Four times a day (QID) | ORAL | Status: DC | PRN
  Administered 2011-11-25 – 2011-11-26 (×3): 650 mg via ORAL
  Filled 2011-11-25 (×4): qty 2

## 2011-11-25 MED ORDER — METHOCARBAMOL 500 MG PO TABS
500.0000 mg | ORAL_TABLET | Freq: Four times a day (QID) | ORAL | Status: DC
  Administered 2011-11-25 – 2011-11-27 (×9): 500 mg via ORAL
  Filled 2011-11-25 (×12): qty 1

## 2011-11-25 MED ORDER — HYDROMORPHONE HCL 2 MG PO TABS
2.0000 mg | ORAL_TABLET | ORAL | Status: DC | PRN
  Administered 2011-11-25 – 2011-11-27 (×11): 4 mg via ORAL
  Filled 2011-11-25 (×11): qty 2

## 2011-11-25 NOTE — Op Note (Signed)
NAMEVASHON, ARCH NO.:  [id number]  MEDICAL RECORD NO.:  [id number]  LOCATION:  6N31C                        FACILITY:  [facility]  PHYSICIAN:  Etter Sjogren, M.D.     DATE OF BIRTH:  [birth year]  DATE OF PROCEDURE:  11/24/2011 DATE OF DISCHARGE:                              OPERATIVE REPORT   PREOPERATIVE DIAGNOSIS:  Breast cancer.  POSTOPERATIVE DIAGNOSIS:  Breast cancer.  PROCEDURE PERFORMED:  Bilateral breast reconstruction with tissue expanders.  SURGEON:  Etter Sjogren, MD  ANESTHESIA:  General.  ESTIMATED BLOOD LOSS:  45 mL.  DRAINS:  Two 19-French on each side.  CLINICAL NOTE:  A 46 year old woman has breast cancer and is having bilateral mastectomy and desired reconstruction.  Options discussed and she elected to use tissue expanders as a planned staged procedure for eventual removal of expander and placement of the implant.  She understood the nature of this procedure and the risks plus complications, which include, but not limited to, bleeding, infection, healing problems, sensory loss, fluid accumulations, anesthesia related complications, loss of tissue, loss of skin of the mastectomy flaps, failure of device, capsular contracture, displacement device, wrinkles, ripples, pneumothorax, pulmonary embolism, disappointment chronic pain, and she understood all of this as well as possible contour deformities and excess adipose tissue around the periphery of the reconstruction. She wished to proceed.  DESCRIPTION OF PROCEDURE:  The patient was seen in the operating room and bilateral mastectomy completed.  The mastectomy flaps were inspected and found to have excellent color and bright red bleeding of periphery consistent viability.  The spaces were irrigated thoroughly with saline as well as antibiotic solution and then the dissection was begun deep to pectoralis major muscle on the lateral border of the pectoralis muscles and then a small portion  of rectus muscle inferiorly and serratus muscle laterally after irrigation with saline and meticulous hemostasis with electrocautery.  The expanders were prepared.  These were Mentor moderate height 550 mL expanders, 75 mL sterile saline placed using a closed filling system.  The expanders soaked in antibiotic solution for greater than 5 minutes.  Antibiotic solution also placed in the submuscular space.  The spaces were inspected and hemostasis was confirmed and the expanders were positioned after having made sure that all the air had been removed.  With the expanders in the position and properly oriented, care was taken to make sure there were flap on the chest wall.  The muscle closure with 3-0 Vicryl interrupted figure-of- eight sutures.  Antibiotic solution placed on the expanders again just prior to completing this closure.  Thorough irrigation had been performed and meticulous hemostasis with electrocautery and the skin closures with 3-0 Monocryl interrupted, inverted deep dermal sutures after having placed two 19-French drains on each side, which were brought through separate stab wounds inferolaterally and secured with 3-0 Prolene sutures.  Dermabond was applied and Biopatch with Tegaderm applied around the drain sites as sterile dressing and ABDs and the chest vest in place and she was transported to the recovery room stable, having tolerated the procedure well.     Etter Sjogren, M.D.     DB/MEDQ  D:  11/24/2011  T:  11/25/2011  Job:  [id number] 

## 2011-11-25 NOTE — Progress Notes (Signed)
Brief Nutrition Note:  RD pulled to pt with MST score of 2. Pt denied weight loss or poor appetite, "unsure weight loss amount" was then indicated generating a score of 2. This score is inaccurate and should be 0.  Chart reviewed, no nutrition interventions warranted at this time. Please consult as needed.   Clarene Duke RD, LDN Pager [phone] After Hours pager [phone]

## 2011-11-25 NOTE — Progress Notes (Signed)
<  principal problem not specified>  Assessment: <principal problem not specified> Stable post op  Plan: Per Dr Odis Luster. Discussed preliminary path (neg nodes)   Subjective: Nausea last night, otherwise OK  Objective: Vital signs in last 24 hours: Temp:  [97.2 F (36.2 C)-99.7 F (37.6 C)] 99.7 F (37.6 C) (11/15 0530) Pulse Rate:  [83-125] 115  (11/15 0530) Resp:  [11-23] 16  (11/15 0530) BP: (96-134)/(58-92) 96/60 mmHg (11/15 0530) SpO2:  [98 %-100 %] 99 % (11/15 0530) Weight:  [149 lb (67.586 kg)] 149 lb (67.586 kg) (11/14 2134) Last BM Date: 11/23/11  Intake/Output from previous day: 11/14 0701 - 11/15 0700 In: 5404 [I.V.:4904; IV Piggyback:500] Out: 2521 [Urine:1850; Drains:346; Blood:325] Intake/Output this shift:    General appearance: alert, cooperative and no distress  Lab Results:  Results for orders placed during the hospital encounter of 11/24/11 (from the past 24 hour(s))  GLUCOSE, CAPILLARY     Status: Abnormal   Collection Time   11/24/11  4:38 PM      Component Value Range   Glucose-Capillary 122 (*) 70 - 99 mg/dL   Comment 1 Notify RN       Studies/Results Radiology     MEDS, Scheduled    .  ceFAZolin (ANCEF) IV  1 g Intravenous Q8H  . [COMPLETED]  ceFAZolin (ANCEF) IV  2 g Intravenous On Call to OR  . docusate sodium  100 mg Oral Daily  . [EXPIRED] HYDROmorphone      . [EXPIRED] HYDROmorphone      . HYDROmorphone PCA 0.3 mg/mL   Intravenous Q4H  . [EXPIRED] HYDROmorphone PCA 0.3 mg/mL      . metFORMIN  500 mg Oral Q breakfast  . [EXPIRED] midazolam      . [COMPLETED] midazolam  1 mg Intravenous Once  . [EXPIRED] ondansetron      . scopolamine  1 patch Transdermal Q72H  . [DISCONTINUED] chlorhexidine  1 application Topical Once  . [DISCONTINUED] chlorhexidine  1 application Topical Once       LOS: 1 day    Currie Paris, MD, [facility], [place] [phone]   11/25/2011 7:40 AM

## 2011-11-25 NOTE — Progress Notes (Signed)
Subjective: Feels OK. Sore. Slight nausea last night. None now.  Objective: Vital signs in last 24 hours: Temp:  [97.2 F (36.2 C)-99.7 F (37.6 C)] 99.7 F (37.6 C) (11/15 0530) Pulse Rate:  [83-125] 115  (11/15 0530) Resp:  [11-23] 16  (11/15 0530) BP: (96-134)/(58-92) 96/60 mmHg (11/15 0530) SpO2:  [98 %-100 %] 99 % (11/15 0530) Weight:  [149 lb (67.586 kg)] 149 lb (67.586 kg) (11/14 2134)  Intake/Output from previous day: 11/14 0701 - 11/15 0700 In: 5404 [I.V.:4904; IV Piggyback:500] Out: 2521 [Urine:1850; Drains:346; Blood:325] Intake/Output this shift:    Operative sites: Mastectomy flaps good color. Viable. Tissue expanders in good position. Drains functioning. Drainage thin.  No results found for this basename: WBC:2,HGB:2,HCT:2,PLATELETS:2,NA:2,K:2,CL:2,CO2:2,BUN:2,CREATININE:2,GLU:2 in the last 72 hours  Studies/Results: Nm Sentinel Node Inj-no Rpt (breast)  11/24/2011  CLINICAL DATA: left breast cancer   Sulfur colloid was injected intradermally by the nuclear medicine  technologist for breast cancer sentinel node localization.      Assessment/Plan: Doing well. Ambulate today. Foley is out. PO pain med. DVT prophylaxis. Encourage spirometry.  LOS: 1 day    Etter Sjogren M 11/25/2011 7:52 AM

## 2011-11-25 NOTE — Progress Notes (Signed)
UR done. 

## 2011-11-26 LAB — CBC
MCH: 31.9 pg (ref 26.0–34.0)
Platelets: 250 10*3/uL (ref 150–400)
RBC: 3.54 MIL/uL — ABNORMAL LOW (ref 3.87–5.11)
RDW: 13.5 % (ref 11.5–15.5)
WBC: 13 10*3/uL — ABNORMAL HIGH (ref 4.0–10.5)

## 2011-11-26 MED ORDER — POLYETHYLENE GLYCOL 3350 17 G PO PACK
17.0000 g | PACK | Freq: Once | ORAL | Status: AC
  Administered 2011-11-26: 17 g via ORAL
  Filled 2011-11-26: qty 1

## 2011-11-26 NOTE — Progress Notes (Signed)
Pt given Breast Cancer bag and explained.  Pt understands how to empty JP drains, has chart for recording drainage from JP's once at home; given measuring container.

## 2011-11-26 NOTE — Progress Notes (Signed)
Pt T 101.8,tylenol 650 mg given po as ordered,encouraged to ambulate and use IS,temp down to 100.5,will continue to monitor.

## 2011-11-26 NOTE — Progress Notes (Signed)
Subjective: Feels better. Had fever last night.Nausea has resolved.  Objective: Vital signs in last 24 hours: Temp:  [99 F (37.2 C)-101.8 F (38.8 C)] 99.7 F (37.6 C) (11/16 1041) Pulse Rate:  [110-120] 111  (11/16 0153) Resp:  [17-18] 18  (11/16 0153) BP: (89-105)/(55-62) 105/57 mmHg (11/16 0153) SpO2:  [95 %-97 %] 95 % (11/16 0153)  Intake/Output from previous day: 11/15 0701 - 11/16 0700 In: 1750 [P.O.:900; I.V.:850] Out: 320 [Drains:320] Intake/Output this shift: Total I/O In: 480 [P.O.:480] Out: -   Operative sites: Mastectomy flaps viable. Healthy. Tissue expanders appear to be in good position. Drains functioning. Drainage serous. No evidence of bleeding or surgical site infection.Alvira Philips 11/26/11 0905  WBC 13.0*  HGB 11.3*  HCT 33.6*  NA --  K --  CL --  CO2 --  BUN --  CREATININE --  GLU --    Studies/Results: Nm Sentinel Node Inj-no Rpt (breast)  11/24/2011  CLINICAL DATA: left breast cancer   Sulfur colloid was injected intradermally by the nuclear medicine  technologist for breast cancer sentinel node localization.      Assessment/Plan: Fever likely due to atelectasis. Encouraged out of bed. Due to fever, will keep her one more day.  LOS: 2 days    Odis Luster, Haldon Carley M 11/26/2011 11:05 AM

## 2011-11-26 NOTE — Progress Notes (Signed)
2 Days Post-Op  Subjective: Stable and alert. Reasonably comfortable. No nausea.No stools. Angulating and halls.  Elevation to 101.8, afebrile currently. Denies cough, shortness of breath., Chills.  Objective: Vital signs in last 24 hours: Temp:  [100 F (37.8 C)-101.8 F (38.8 C)] 100.9 F (38.3 C) (11/16 0153) Pulse Rate:  [110-120] 111  (11/16 0153) Resp:  [17-18] 18  (11/16 0153) BP: (89-105)/(55-62) 105/57 mmHg (11/16 0153) SpO2:  [95 %-97 %] 95 % (11/16 0153) FiO2 (%):  [99 %] 99 % (11/15 0816) Last BM Date: 11/23/11  Intake/Output from previous day: 11/15 0701 - 11/16 0700 In: 1750 [P.O.:900; I.V.:850] Out: 320 [Drains:320] Intake/Output this shift:    General appearance: alert. Friendly. In no distress. Does not look toxic or ill. Resp: lungs clear to auscultation bilaterally. Examination anterior lung fields only. No rhonchi or wheeze. Breasts: , bilateral mastectomy incisions intact. No necrosis. Slight skin erythema right greater than left, does not look infected, postsurgical appearance. All 4 drains functioning with clear serous fluid.  Lab Results:  No results found for this or any previous visit (from the past 24 hour(s)).   Studies/Results: @RISRSLT24 @     .  ceFAZolin (ANCEF) IV  1 g Intravenous Q8H  . docusate sodium  100 mg Oral Daily  . heparin subcutaneous  5,000 Units Subcutaneous Q8H  . metFORMIN  500 mg Oral Q breakfast  . methocarbamol  500 mg Oral QID  . scopolamine  1 patch Transdermal Q72H     Assessment/Plan: s/p Procedure(s): SIMPLE MASTECTOMY WITH AXILLARY SENTINEL NODE BIOPSY SIMPLE MASTECTOMY TISSUE EXPANDER  POD #2. Stable.  MiraLAX today for constipation Final pathology pending  Fever. Most likely cause would be atelectasis on postop day 2. Encourage inc. spirometry and coughing Cardiac ablation Check CBC  Discharge plans per Dr. Odis Luster.  Patient Active Hospital Problem List: No active hospital problems.   LOS: 2  days    Briunna Leicht M. Derrell Lolling, M.D., [facility] General and Minimally invasive Surgery Breast and Colorectal Surgery Office:   [phone] Pager:   [phone]  11/26/2011  . .prob

## 2011-11-26 NOTE — Progress Notes (Signed)
Temp 100.9,notified on call MD,no new order given,ambulated pt and IS encouraged,will continue to monitor.

## 2011-11-27 MED ORDER — ENOXAPARIN SODIUM 40 MG/0.4ML ~~LOC~~ SOLN: 40.0000 mg | SUBCUTANEOUS | Status: DC

## 2011-11-27 MED ORDER — HYDROMORPHONE HCL 2 MG PO TABS: 2.0000 mg | ORAL_TABLET | ORAL | Status: DC | PRN

## 2011-11-27 MED ORDER — CEPHALEXIN 250 MG PO CAPS: 250.0000 mg | ORAL_CAPSULE | Freq: Two times a day (BID) | ORAL | Status: DC

## 2011-11-27 MED ORDER — ENOXAPARIN (LOVENOX) PATIENT EDUCATION KIT
PACK | Freq: Once | Status: AC
  Administered 2011-11-27: 12:00:00
  Filled 2011-11-27: qty 1

## 2011-11-27 MED ORDER — DSS 100 MG PO CAPS: 100.0000 mg | ORAL_CAPSULE | Freq: Every day | ORAL | Status: DC

## 2011-11-27 MED ORDER — CEPHALEXIN 250 MG PO CAPS
250.0000 mg | ORAL_CAPSULE | Freq: Two times a day (BID) | ORAL | Status: DC
  Administered 2011-11-27: 250 mg via ORAL
  Filled 2011-11-27 (×2): qty 1

## 2011-11-27 MED ORDER — ENOXAPARIN SODIUM 40 MG/0.4ML ~~LOC~~ SOLN
40.0000 mg | Freq: Every day | SUBCUTANEOUS | Status: DC
  Filled 2011-11-27: qty 0.4

## 2011-11-27 MED ORDER — METHOCARBAMOL 500 MG PO TABS: 500.0000 mg | ORAL_TABLET | Freq: Four times a day (QID) | ORAL | Status: DC

## 2011-11-27 NOTE — Progress Notes (Signed)
3 Days Post-Op  Subjective: Doing well. Low-grade temp yesterday, afebrile since. Ambulating. Some itching with narcotics but no rash. She feels ready to go home.  Objective: Vital signs in last 24 hours: Temp:  [99 F (37.2 C)-100.8 F (38.2 C)] 99 F (37.2 C) (11/17 0528) Pulse Rate:  [92-114] 92  (11/17 0528) Resp:  [16-18] 17  (11/17 0528) BP: (90-105)/(56-62) 90/56 mmHg (11/17 0528) SpO2:  [95 %-98 %] 96 % (11/17 0528) Last BM Date: 11/23/11  Intake/Output from previous day: 11/16 0701 - 11/17 0700 In: 840 [P.O.:840] Out: 170 [Drains:170] Intake/Output this shift:    General appearance: Alert. Spirits good. No distress. Mental status normal. Breasts:  bilateral mastectomy skin flaps look good. No hematoma. Drainage thin. All drains functioning.  Lab Results:  Results for orders placed during the hospital encounter of 11/24/11 (from the past 24 hour(s))  CBC     Status: Abnormal   Collection Time   11/26/11  9:05 AM      Component Value Range   WBC 13.0 (*) 4.0 - 10.5 K/uL   RBC 3.54 (*) 3.87 - 5.11 MIL/uL   Hemoglobin 11.3 (*) 12.0 - 15.0 g/dL   HCT 40.9 (*) 81.1 - 91.4 %   MCV 94.9  78.0 - 100.0 fL   MCH 31.9  26.0 - 34.0 pg   MCHC 33.6  30.0 - 36.0 g/dL   RDW 78.2  95.6 - 21.3 %   Platelets 250  150 - 400 K/uL     Studies/Results: @RISRSLT24 @     .  ceFAZolin (ANCEF) IV  1 g Intravenous Q8H  . docusate sodium  100 mg Oral Daily  . heparin subcutaneous  5,000 Units Subcutaneous Q8H  . metFORMIN  500 mg Oral Q breakfast  . methocarbamol  500 mg Oral QID  . [COMPLETED] polyethylene glycol  17 g Oral Once  . scopolamine  1 patch Transdermal Q72H     Assessment/Plan: s/p Procedure(s): SIMPLE MASTECTOMY WITH AXILLARY SENTINEL NODE BIOPSY SIMPLE MASTECTOMY TISSUE EXPANDER  POD #3. Stable. Fever resolving. Probably self-limited atelectasis. Pathology pending. I told her this would be ready by Monday afternoon most likely.  I think she is ready  for  discharge from a general surgery standpoint. Discharge plans pending Dr. Odis Luster evaluation this morning.  She has an appointment to see Dr. Jamey Ripa  the first week of December. I instructed her to call the office Monday for the pathology report.  Patient Active Hospital Problem List: No active hospital problems.   LOS: 3 days    Ta Fair M 11/27/2011  . .prob

## 2011-11-27 NOTE — Discharge Summary (Signed)
Physician Discharge Summary  Patient ID: Samantha Andersen MRN: [id number] DOB/AGE: 1965-06-01 46 y.o.  Admit date: 11/24/2011 Discharge date: 11/27/2011  Admission Diagnoses:Breast cancer  Discharge Diagnoses: Same Active Problems:  * No active hospital problems. *    Discharged Condition: good  Hospital Course: On the day of admission the patient was taken to surgery and had bilateral mastectomy and reconstruction with tissue expanders. The patient tolerated the procedures well. Postoperatively, the flap maintained excellent color and capillary refill. The patient was ambulatory and tolerating diet on the first postoperative day. She had a low grade fever that eventually responded to ambulation and incentive spirometry. Most likely atelectasis. Operative sites continue to look good..  Significant Diagnostic Studies: labs: WBC 13,000 post-op  Treatments: antibiotics: Ancef, anticoagulation: heparin and surgery: bilateral mastectomy and placement tissue expanders  Discharge Exam: Blood pressure 90/56, pulse 92, temperature 99 F (37.2 C), temperature source Oral, resp. rate 17, height 5\' 2"  (1.575 m), weight 149 lb (67.586 kg), SpO2 96.00%.  Operative sites: Mastectomy flaps viable. Tissue expanders appear to be in good position. Drains functioning. Drainage thin. No evidence of bleeding or infection either side.  Disposition: Discharge. Return to office this week for recheck.  Discharge Orders    Future Appointments: Provider: Department: Dept Phone: Center:   12/14/2011 4:00 PM Currie Paris, MD [facility], [place] [phone] None   12/16/2011 1:00 PM Victorino December, MD [facility] [phone] None       Medication List     As of 11/27/2011 10:23 AM    STOP taking these medications         diazepam 2 MG tablet   Commonly known as: VALIUM      TAKE these medications         acetaminophen 500 MG tablet   Commonly known as:  TYLENOL   Take 500 mg by mouth every 6 (six) hours as needed. For pain      cephALEXin 250 MG capsule   Commonly known as: KEFLEX   Take 1 capsule (250 mg total) by mouth every 12 (twelve) hours.      DSS 100 MG Caps   Take 100 mg by mouth daily.      enoxaparin 40 MG/0.4ML injection   Commonly known as: LOVENOX   Inject 0.4 mLs (40 mg total) into the skin daily.      HYDROmorphone 2 MG tablet   Commonly known as: DILAUDID   Take 1-2 tablets (2-4 mg total) by mouth every 4 (four) hours as needed.      metFORMIN 500 MG 24 hr tablet   Commonly known as: GLUCOPHAGE-XR   Take 500 mg by mouth daily with breakfast.      methocarbamol 500 MG tablet   Commonly known as: ROBAXIN   Take 1 tablet (500 mg total) by mouth 4 (four) times daily.      vitamin B-12 1000 MCG tablet   Commonly known as: CYANOCOBALAMIN   Take 1,000 mcg by mouth daily.         SignedOdis Luster, Berenize Gatlin M 11/27/2011, 10:23 AM

## 2011-11-27 NOTE — Progress Notes (Signed)
Pt discharged to home accomp by sister and husband.  Pt, husband and sister were all instructed on how to give Lovenox injections daily.  Lovenox education kit was given to pt and explained.   Rx's were called into [facility] by Dr. Odis Luster.  Copy of discharge instructions given and explained to pt and family.  Pt understands how to empty JP drains and record drainage and to take the record into dr. With her.

## 2011-11-28 ENCOUNTER — Telehealth (INDEPENDENT_AMBULATORY_CARE_PROVIDER_SITE_OTHER): Payer: Self-pay | Admitting: General Surgery

## 2011-11-28 NOTE — Telephone Encounter (Signed)
Patient made aware pathology not back yet. Will call her when result comes in.

## 2011-11-28 NOTE — Telephone Encounter (Signed)
Message copied by Liliana Cline on Mon Nov 28, 2011  2:10 PM ------      Message from: Marnette Burgess      Created: Mon Nov 28, 2011  1:46 PM      Contact: [phone]       Calling for results on pathology, please call.

## 2011-11-29 ENCOUNTER — Telehealth (INDEPENDENT_AMBULATORY_CARE_PROVIDER_SITE_OTHER): Payer: Self-pay | Admitting: General Surgery

## 2011-11-29 NOTE — Telephone Encounter (Signed)
I called patient and made her aware margins and lymph nodes negative. She requests to pick up a copy of pathology report when she comes to see Dr Odis Luster. Copy will be at the front for patient pick up.

## 2011-12-02 ENCOUNTER — Telehealth: Payer: Self-pay | Admitting: *Deleted

## 2011-12-02 NOTE — Telephone Encounter (Signed)
Spoke to pt concerning pathology report.  Confirmed future appts with Dr. Jamey Ripa and Dr. Welton Flakes.  Pt denies further needs or concerns.  Contact information given.

## 2011-12-14 ENCOUNTER — Ambulatory Visit (INDEPENDENT_AMBULATORY_CARE_PROVIDER_SITE_OTHER): Payer: BC Managed Care – PPO | Admitting: Surgery

## 2011-12-14 ENCOUNTER — Encounter (INDEPENDENT_AMBULATORY_CARE_PROVIDER_SITE_OTHER): Payer: Self-pay | Admitting: Surgery

## 2011-12-14 VITALS — BP 102/62 | HR 84 | Temp 97.3°F | Resp 18 | Ht 62.0 in | Wt 144.0 lb

## 2011-12-14 DIAGNOSIS — Z09 Encounter for follow-up examination after completed treatment for conditions other than malignant neoplasm: Secondary | ICD-10-CM

## 2011-12-14 NOTE — Patient Instructions (Addendum)
See me again in a month     Samantha Andersen  is OK to attend the ABC Class Currie Paris, MD, [facility], [place] [phone] 12/14/2011 5:02 PM

## 2011-12-14 NOTE — Progress Notes (Signed)
Samantha Andersen    [id number] 12/14/2011    [birth year]   CC: Post op Mastectomy  HPI: The patient returns for post op follow-up. She underwent a bilataeral mastectomy, left SLN, and implant reconstruction on 11/24/11. Over all she feels that she is doing well.   PE: VITAL SIGNS: BP 102/62  Pulse 84  Temp 97.3 F (36.3 C) (Temporal)  Resp 18  Ht 5\' 2"  (1.575 m)  Wt 144 lb (65.318 kg)  BMI 26.34 kg/m2  The incision is healing nicely and there is no evidence of infection or hematoma.  The drains are already removed.  She has some limitation of motion of the shoulders, left moreso than right  DATA REVIEWED: Pathology report showed: Diagnosis 1. Lymph node, sentinel, biopsy, Left breast - THERE IS NO EVIDENCE OF CARCINOMA IN 1 OF 1 LYMPH NODE (0/1). 2. Lymph node, sentinel, biopsy, Left breast - THERE IS NO EVIDENCE OF CARCINOMA IN 1 OF 1 LYMPH NODE (0/1). 3. Lymph node, sentinel, biopsy, Left breast - THERE IS NO EVIDENCE OF CARCINOMA IN 1 OF 1 LYMPH NODE (0/1). 4. 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 - THE SURGICAL RESECTION MARGINS ARE NEGATIVE FOR DUCTAL CARCINOMA. - SEE ONCOLOGY TABLE BELOW. 5. Breast, simple mastectomy, Right - LOBULAR NEOPLASIA (ATYPICAL LOBULAR HYPERPLASIA). - HEALING BIOPSY SITE. - FIBROCYSTIC CHANGES WITH CALCIFICATIONS.  IMPRESSION: Patient doing well.   PLAN: Her next visit will be in one month; gave her a copy of the path report and gave her info for the abc class.

## 2011-12-16 ENCOUNTER — Telehealth: Payer: Self-pay | Admitting: Oncology

## 2011-12-16 ENCOUNTER — Encounter: Payer: Self-pay | Admitting: Oncology

## 2011-12-16 ENCOUNTER — Ambulatory Visit (HOSPITAL_BASED_OUTPATIENT_CLINIC_OR_DEPARTMENT_OTHER): Payer: BC Managed Care – PPO | Admitting: Oncology

## 2011-12-16 VITALS — BP 106/73 | HR 88 | Temp 98.8°F | Resp 20 | Ht 62.0 in | Wt 146.1 lb

## 2011-12-16 DIAGNOSIS — Z9010 Acquired absence of unspecified breast and nipple: Secondary | ICD-10-CM

## 2011-12-16 DIAGNOSIS — Z148 Genetic carrier of other disease: Secondary | ICD-10-CM

## 2011-12-16 DIAGNOSIS — C50419 Malignant neoplasm of upper-outer quadrant of unspecified female breast: Secondary | ICD-10-CM

## 2011-12-16 DIAGNOSIS — D0590 Unspecified type of carcinoma in situ of unspecified breast: Secondary | ICD-10-CM

## 2011-12-16 DIAGNOSIS — Z170 Estrogen receptor positive status [ER+]: Secondary | ICD-10-CM

## 2011-12-16 HISTORY — DX: Genetic carrier of other disease: Z14.8

## 2011-12-16 NOTE — Progress Notes (Signed)
OFFICE PROGRESS NOTE  CC  Samantha Daniels, MD [address], [address] [place] [address] Dr. Antony Blackbird Dr. Cyndia Bent  DIAGNOSIS: 46 year old female with new diagnosis of left breast cancers DCIS stage 0 and atypical lobular hyperplasia of the right breast. Patient is status post bilateral mastectomies with immediate reconstruction.  PRIOR THERAPY:  #1 patient was originally seen in the multidisciplinary breast clinic on 10/05/2011. 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 Because of this patient underwent left mastectomy but she also opted for a right mastectomy. This was performed on 11/24/2011 with immediate reconstruction with tissue expanders.  #2 patient has had immediate reconstruction with tissue expander she is seeing Dr. Etter Sjogren.  #3 patient has had genetic counseling and testing and she was found to be negative for BRCA1 and 2 gene mutation.  CURRENT THERAPY: From oncology perspective observation  INTERVAL HISTORY: Samantha Andersen 46 y.o. female returns for followup visit post mastectomy. Overall she's doing well she does have pain and numbness at the surgical site. Otherwise she has no fevers chills night sweats headaches no shortness of breath no chest pains or palpitations no dizziness. Remainder of the 10 point review of systems is negative. She has no evidence of local infections.  MEDICAL HISTORY: Past Medical History  Diagnosis Date  . PCOS (polycystic ovarian syndrome)   . Anemia   . Hyperlipidemia   . Anxiety   . Hot flashes   . Joint pain   . Breast cancer     left  . Spondyloarthritis   . Complication of anesthesia     tachycardic, also reports that she needed increased anesth. because she has a high threshold for medicine/sedation  . PONV (postoperative nausea and vomiting)     also reports N&V accompanies anxiety    ALLERGIES:  is  allergic to doxycycline; other; tetracyclines & related; ciprofloxacin; and metformin and related.  MEDICATIONS:  Current Outpatient Prescriptions  Medication Sig Dispense Refill  . acetaminophen (TYLENOL) 500 MG tablet Take 500 mg by mouth every 6 (six) hours as needed. For pain      . HYDROcodone-acetaminophen (NORCO) 10-325 MG per tablet       . HYDROmorphone (DILAUDID) 2 MG tablet Take 1-2 tablets (2-4 mg total) by mouth every 4 (four) hours as needed.  40 tablet  0  . metFORMIN (GLUCOPHAGE-XR) 500 MG 24 hr tablet Take 500 mg by mouth daily with breakfast.      . methocarbamol (ROBAXIN) 500 MG tablet Take 1 tablet (500 mg total) by mouth 4 (four) times daily.  40 tablet  1  . docusate sodium 100 MG CAPS Take 100 mg by mouth daily.  30 capsule  1  . vitamin B-12 (CYANOCOBALAMIN) 1000 MCG tablet Take 1,000 mcg by mouth daily.        SURGICAL HISTORY:  Past Surgical History  Procedure Date  . Abdominal hysterectomy   . Cesarean section     x2  . Simple mastectomy with axillary sentinel node biopsy 11/24/2011    Procedure: SIMPLE MASTECTOMY WITH AXILLARY SENTINEL NODE BIOPSY;  Surgeon: Currie Paris, MD;  Location: [facility] OR;  Service: General;  Laterality: Left;  Bilateral Total Mastectomy and Left Sentinel Node  . Simple mastectomy with axillary sentinel node biopsy 11/24/2011    Procedure: SIMPLE MASTECTOMY;  Surgeon: Currie Paris, MD;  Location: [facility] OR;  Service: General;  Laterality: Right;  .  Tissue expander placement 11/24/2011    Procedure: TISSUE EXPANDER;  Surgeon: Etter Sjogren, MD;  Location: [facility] OR;  Service: Plastics;  Laterality: Bilateral;    REVIEW OF SYSTEMS:  Pertinent items are noted in HPI.   HEALTH MAINTENANCE:   PHYSICAL EXAMINATION: Blood pressure 106/73, pulse 88, temperature 98.8 F (37.1 C), temperature source Oral, resp. rate 20, height 5\' 2"  (1.575 m), weight 146 lb 1.6 oz (66.271 kg). Body mass index is 26.72 kg/(m^2). ECOG PERFORMANCE STATUS: 0 -  Asymptomatic   Head: Normocephalic, without obvious abnormality, atraumatic Lymph nodes: Cervical, supraclavicular, and axillary nodes normal. Resp: clear to auscultation bilaterally Back: symmetric, no curvature. ROM normal. No CVA tenderness. Cardio: regular rate and rhythm GI: soft, non-tender; bowel sounds normal; no masses,  no organomegaly Extremities: extremities normal, atraumatic, no cyanosis or edema Neurologic: Grossly normal Bilateral mastectomy scars are healing well. No evidence of infections.  LABORATORY DATA: Lab Results  Component Value Date   WBC 13.0* 11/26/2011   HGB 11.3* 11/26/2011   HCT 33.6* 11/26/2011   MCV 94.9 11/26/2011   PLT 250 11/26/2011      Chemistry      Component Value Date/Time   NA 138 11/17/2011 1514   NA 139 10/05/2011 1154   K 4.1 11/17/2011 1514   K 3.9 10/05/2011 1154   CL 100 11/17/2011 1514   CL 103 10/05/2011 1154   CO2 26 11/17/2011 1514   CO2 24 10/05/2011 1154   BUN 14 11/17/2011 1514   BUN 11.0 10/05/2011 1154   CREATININE 0.72 11/17/2011 1514   CREATININE 0.8 10/05/2011 1154      Component Value Date/Time   CALCIUM 9.6 11/17/2011 1514   CALCIUM 9.9 10/05/2011 1154   ALKPHOS 48 11/17/2011 1514   ALKPHOS 45 10/05/2011 1154   AST 22 11/17/2011 1514   AST 16 10/05/2011 1154   ALT 18 11/17/2011 1514   ALT 13 10/05/2011 1154   BILITOT 0.3 11/17/2011 1514   BILITOT 0.30 10/05/2011 1154    FINAL DIAGNOSIS Diagnosis 1. Lymph node, sentinel, biopsy, Left breast - THERE IS NO EVIDENCE OF CARCINOMA IN 1 OF 1 LYMPH NODE (0/1). 2. Lymph node, sentinel, biopsy, Left breast - THERE IS NO EVIDENCE OF CARCINOMA IN 1 OF 1 LYMPH NODE (0/1). 3. Lymph node, sentinel, biopsy, Left breast - THERE IS NO EVIDENCE OF CARCINOMA IN 1 OF 1 LYMPH NODE (0/1). 4. 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 - THE SURGICAL RESECTION MARGINS ARE NEGATIVE FOR DUCTAL CARCINOMA. - SEE ONCOLOGY  TABLE BELOW. 5. Breast, simple mastectomy, Right - LOBULAR NEOPLASIA (ATYPICAL LOBULAR HYPERPLASIA). - HEALING BIOPSY SITE. - FIBROCYSTIC CHANGES WITH CALCIFICATIONS. Microscopic Comment 4. BREAST, IN SITU CARCINOMA Specimen, including laterality: Left breast Procedure: Simple mastectomy Grade of carcinoma: Intermediate grade Necrosis: Present Estimated tumor size: (gross measurement): 5.8 cm Treatment effect: N/A Distance to closest margin: Focally 0.5 cm to the deep margin (gross measurement) Breast prognostic profile: [phone] Estrogen receptor: 99%, moderate staining intensity Progesterone receptor: 69%, moderate staining intensity 1 of 3 FINAL for JOVIE, SWANNER ([id number]) Microscopic Comment(continued) Lymph nodes: Examined: 3 Lymph nodes with metastasis: 0 TNM: pTis, pN0 (JBK:kh 11-29-11) JOSHUA KISH   RADIOGRAPHIC STUDIES:  Nm Sentinel Node Inj-no Rpt (breast)  11/24/2011  CLINICAL DATA: left breast cancer   Sulfur colloid was injected intradermally by the nuclear medicine  technologist for breast cancer sentinel node localization.      ASSESSMENT: 46 year old female with  #  15.8 cm DCIS of the left breast that was ER positive PR positive. Patient is now status post bilateral mastectomies with immediate reconstruction. The left breast revealed a DCIS the right breast revealed atypical lobular hyperplasia. Postoperatively she is doing well.   PLAN:   #1 patient will continue to be seen by Dr. Odis Luster for reconstruction.  #2 patient and I discussed healthy lifestyle to exercise eating. We had an extensive discussion regarding this. Her husband and mother with her.   All questions were answered. The patient knows to call the clinic with any problems, questions or concerns. We can certainly see the patient much sooner if necessary.  I spent 40 minutes counseling the patient face to face. The total time spent in the appointment was 30  minutes.    Drue Second, MD Medical/Oncology [facility] [phone] (beeper) [phone] (Office)  12/16/2011, 1:35 PM

## 2011-12-16 NOTE — Telephone Encounter (Signed)
gve the pt her may 2014 appt calendar 

## 2011-12-16 NOTE — Patient Instructions (Addendum)
Proceed with reconstruction  I will see you back in 6 months

## 2011-12-22 ENCOUNTER — Ambulatory Visit: Payer: BC Managed Care – PPO | Attending: Surgery | Admitting: Physical Therapy

## 2011-12-22 DIAGNOSIS — M25519 Pain in unspecified shoulder: Secondary | ICD-10-CM | POA: Insufficient documentation

## 2011-12-22 DIAGNOSIS — IMO0001 Reserved for inherently not codable concepts without codable children: Secondary | ICD-10-CM | POA: Insufficient documentation

## 2011-12-22 DIAGNOSIS — C50919 Malignant neoplasm of unspecified site of unspecified female breast: Secondary | ICD-10-CM | POA: Insufficient documentation

## 2011-12-22 DIAGNOSIS — M25619 Stiffness of unspecified shoulder, not elsewhere classified: Secondary | ICD-10-CM | POA: Insufficient documentation

## 2011-12-27 ENCOUNTER — Ambulatory Visit: Payer: BC Managed Care – PPO

## 2011-12-28 ENCOUNTER — Ambulatory Visit: Payer: BC Managed Care – PPO

## 2011-12-29 ENCOUNTER — Telehealth: Payer: Self-pay | Admitting: Genetic Counselor

## 2011-12-29 ENCOUNTER — Ambulatory Visit: Payer: BC Managed Care – PPO

## 2011-12-29 NOTE — Telephone Encounter (Signed)
Left message that we have the remainder of her genetic test results back and to please call back.

## 2012-01-02 ENCOUNTER — Ambulatory Visit: Payer: BC Managed Care – PPO

## 2012-01-09 ENCOUNTER — Ambulatory Visit: Payer: BC Managed Care – PPO

## 2012-01-12 ENCOUNTER — Encounter: Payer: Self-pay | Admitting: Genetic Counselor

## 2012-01-13 ENCOUNTER — Telehealth: Payer: Self-pay | Admitting: Genetic Counselor

## 2012-01-13 ENCOUNTER — Encounter (INDEPENDENT_AMBULATORY_CARE_PROVIDER_SITE_OTHER): Payer: Self-pay | Admitting: Surgery

## 2012-01-13 ENCOUNTER — Ambulatory Visit (INDEPENDENT_AMBULATORY_CARE_PROVIDER_SITE_OTHER): Payer: BC Managed Care – PPO | Admitting: Surgery

## 2012-01-13 VITALS — BP 98/60 | HR 80 | Resp 16 | Ht 62.0 in | Wt 145.0 lb

## 2012-01-13 DIAGNOSIS — C50419 Malignant neoplasm of upper-outer quadrant of unspecified female breast: Secondary | ICD-10-CM

## 2012-01-13 NOTE — Telephone Encounter (Signed)
Discussed the Brip1 VUS, and that we would not change her medical management based on this result.  At this time, we do not know if it is a benign or deleterious genetic change.  Once it is reclassified, we will know more.  Reiterated that we cannot make any predictions for her on her chance for ovarian cancer.

## 2012-01-13 NOTE — Progress Notes (Signed)
Samantha Andersen    [id number] 01/13/2012    10-Sep-1965   CC: Post op Mastectomy  HPI: The patient returns for post op follow-up. She underwent a bilataeral mastectomy, left SLN, and implant reconstruction on 11/24/11. Over all she feels that she is doing well.Since I last saw her she has been to PT and now feels she has full ROM of both shoulders. She had a small area on the right incision open up, being managed by Dr Thompson Caul   PE: VITAL SIGNS: BP 98/60  Pulse 80  Resp 16  Ht 5\' 2"  (1.575 m)  Wt 145 lb (65.772 kg)  BMI 26.52 kg/m2  Wounds OK, full ROM shoulders DATA REVIEWED: Pathology report showed: Diagnosis 1. Lymph node, sentinel, biopsy, Left breast - THERE IS NO EVIDENCE OF CARCINOMA IN 1 OF 1 LYMPH NODE (0/1). 2. Lymph node, sentinel, biopsy, Left breast - THERE IS NO EVIDENCE OF CARCINOMA IN 1 OF 1 LYMPH NODE (0/1). 3. Lymph node, sentinel, biopsy, Left breast - THERE IS NO EVIDENCE OF CARCINOMA IN 1 OF 1 LYMPH NODE (0/1). 4. 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 - THE SURGICAL RESECTION MARGINS ARE NEGATIVE FOR DUCTAL CARCINOMA. - SEE ONCOLOGY TABLE BELOW. 5. Breast, simple mastectomy, Right - LOBULAR NEOPLASIA (ATYPICAL LOBULAR HYPERPLASIA). - HEALING BIOPSY SITE. - FIBROCYSTIC CHANGES WITH CALCIFICATIONS.  IMPRESSION: Patient doing well.   PLAN: RTC PRN. Discussed what to look for in terms of local recurrence

## 2012-01-13 NOTE — Patient Instructions (Signed)
We will see you again on an as needed basis. Please call the office at [phone] if you have any questions or concerns. Thank you for allowing us to take care of you.  

## 2012-01-27 ENCOUNTER — Other Ambulatory Visit: Payer: Self-pay

## 2012-02-25 ENCOUNTER — Other Ambulatory Visit: Payer: Self-pay

## 2012-05-18 ENCOUNTER — Telehealth: Payer: Self-pay | Admitting: Oncology

## 2012-05-18 ENCOUNTER — Ambulatory Visit (HOSPITAL_BASED_OUTPATIENT_CLINIC_OR_DEPARTMENT_OTHER): Payer: BC Managed Care – PPO | Admitting: Oncology

## 2012-05-18 ENCOUNTER — Other Ambulatory Visit (HOSPITAL_BASED_OUTPATIENT_CLINIC_OR_DEPARTMENT_OTHER): Payer: BC Managed Care – PPO | Admitting: Lab

## 2012-05-18 ENCOUNTER — Encounter: Payer: Self-pay | Admitting: Oncology

## 2012-05-18 VITALS — BP 112/78 | HR 79 | Temp 98.7°F | Resp 20 | Ht 62.0 in | Wt 136.3 lb

## 2012-05-18 DIAGNOSIS — Z170 Estrogen receptor positive status [ER+]: Secondary | ICD-10-CM

## 2012-05-18 DIAGNOSIS — C50419 Malignant neoplasm of upper-outer quadrant of unspecified female breast: Secondary | ICD-10-CM

## 2012-05-18 DIAGNOSIS — D0590 Unspecified type of carcinoma in situ of unspecified breast: Secondary | ICD-10-CM

## 2012-05-18 DIAGNOSIS — D509 Iron deficiency anemia, unspecified: Secondary | ICD-10-CM

## 2012-05-18 DIAGNOSIS — Z148 Genetic carrier of other disease: Secondary | ICD-10-CM

## 2012-05-18 DIAGNOSIS — N62 Hypertrophy of breast: Secondary | ICD-10-CM

## 2012-05-18 DIAGNOSIS — C50412 Malignant neoplasm of upper-outer quadrant of left female breast: Secondary | ICD-10-CM

## 2012-05-18 LAB — FERRITIN: Ferritin: 14 ng/mL (ref 10–291)

## 2012-05-18 LAB — IRON AND TIBC
TIBC: 329 ug/dL (ref 250–470)
UIBC: 254 ug/dL (ref 125–400)

## 2012-05-18 LAB — CBC WITH DIFFERENTIAL/PLATELET
BASO%: 0.4 % (ref 0.0–2.0)
MCHC: 33.5 g/dL (ref 31.5–36.0)
MONO#: 0.4 10*3/uL (ref 0.1–0.9)
RBC: 4.21 10*6/uL (ref 3.70–5.45)
WBC: 7 10*3/uL (ref 3.9–10.3)
lymph#: 1.9 10*3/uL (ref 0.9–3.3)

## 2012-05-18 LAB — COMPREHENSIVE METABOLIC PANEL (CC13)
AST: 17 U/L (ref 5–34)
Alkaline Phosphatase: 54 U/L (ref 40–150)
BUN: 13.4 mg/dL (ref 7.0–26.0)
Calcium: 9.2 mg/dL (ref 8.4–10.4)
Chloride: 108 mEq/L — ABNORMAL HIGH (ref 98–107)
Creatinine: 0.7 mg/dL (ref 0.6–1.1)

## 2012-05-18 NOTE — Patient Instructions (Addendum)
Doing well I will see you back in 1 year

## 2012-05-21 ENCOUNTER — Other Ambulatory Visit: Payer: Self-pay | Admitting: Emergency Medicine

## 2012-05-21 ENCOUNTER — Telehealth: Payer: Self-pay | Admitting: Oncology

## 2012-05-21 DIAGNOSIS — D509 Iron deficiency anemia, unspecified: Secondary | ICD-10-CM | POA: Insufficient documentation

## 2012-05-21 NOTE — Telephone Encounter (Signed)
S/w pt re appt for 5/19. Pt forwarded to nurse re questions about inf.

## 2012-05-21 NOTE — Progress Notes (Signed)
OFFICE PROGRESS NOTE  CC  Samantha Daniels, MD [address] [place] [address] Dr. Antony Blackbird Dr. Cyndia Bent  DIAGNOSIS: 47 year old female with new diagnosis of left breast cancers DCIS stage 0 and atypical lobular hyperplasia of the right breast. Patient is status post bilateral mastectomies with immediate reconstruction.  PRIOR THERAPY:  #1 patient was originally seen in the multidisciplinary breast clinic on 10/05/2011. 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 Because of this patient underwent left mastectomy but she also opted for a right mastectomy. This was performed on 11/24/2011 with immediate reconstruction with tissue expanders.  #2 patient has had immediate reconstruction with tissue expander she is seeing Dr. Etter Sjogren.  #3 patient has had genetic counseling and testing and she was found to be negative for BRCA1 and 2 gene mutation.  CURRENT THERAPY:  observation  INTERVAL HISTORY: Samantha Andersen 47 y.o. female returns for followup visit post mastectomy. Overall she's doing well she does have pain and numbness at the surgical site. Otherwise she has no fevers chills night sweats headaches no shortness of breath no chest pains or palpitations no dizziness. Patient does complain of fatigue I therefore did check iron studies on her today. She does have a  personal history of anemia Remainder of the 10 point review of systems is negative. She has no evidence of local infections.  MEDICAL HISTORY: Past Medical History  Diagnosis Date  . PCOS (polycystic ovarian syndrome)   . Anemia   . Hyperlipidemia   . Anxiety   . Hot flashes   . Joint pain   . Breast cancer     left  . Spondyloarthritis   . Complication of anesthesia     tachycardic, also reports that she needed increased anesth. because she has a high threshold for medicine/sedation  . PONV  (postoperative nausea and vomiting)     also reports N&V accompanies anxiety  . 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  MEDICATIONS:  Current Outpatient Prescriptions  Medication Sig Dispense Refill  . metFORMIN (GLUCOPHAGE-XR) 500 MG 24 hr tablet Take 500 mg by mouth daily with breakfast.      . cephALEXin (KEFLEX) 500 MG capsule       . enoxaparin (LOVENOX) 40 MG/0.4ML injection       . HYDROmorphone (DILAUDID) 2 MG tablet Take 2 mg by mouth every 4 (four) hours as needed for pain (2.4 mg sched will start after surgery 05/29/12).      Marland Kitchen ibuprofen (ADVIL,MOTRIN) 100 MG tablet Take 100 mg by mouth as needed.      . methocarbamol (ROBAXIN) 500 MG tablet Take 1 tablet (500 mg total) by mouth 4 (four) times daily.  40 tablet  1   No current facility-administered medications for this visit.    SURGICAL HISTORY:  Past Surgical History  Procedure Laterality Date  . Abdominal hysterectomy    . Cesarean section      x2  . Simple mastectomy with axillary sentinel node biopsy  11/24/2011    Procedure: SIMPLE MASTECTOMY WITH AXILLARY SENTINEL NODE BIOPSY;  Surgeon: Currie Paris, MD;  Location: [facility] OR;  Service: General;  Laterality: Left;  Bilateral Total Mastectomy and Left Sentinel Node  . Simple mastectomy with axillary sentinel node biopsy  11/24/2011    Procedure: SIMPLE MASTECTOMY;  Surgeon: Currie Paris, MD;  Location[facility] OR;  Service: General;  Laterality: Right;  . Tissue expander placement  11/24/2011    Procedure: TISSUE EXPANDER;  Surgeon: Etter Sjogren, MD;  Location: [facility] OR;  Service: Plastics;  Laterality: Bilateral;    REVIEW OF SYSTEMS:  Pertinent items are noted in HPI.   HEALTH MAINTENANCE:   PHYSICAL EXAMINATION: Blood pressure 112/78, pulse 79, temperature 98.7 F (37.1 C), temperature source Oral, resp. rate 20, height 5\' 2"  (1.575 m), weight 136  lb 4.8 oz (61.825 kg). Body mass index is 24.92 kg/(m^2). ECOG PERFORMANCE STATUS: 0 - Asymptomatic   Head: Normocephalic, without obvious abnormality, atraumatic Lymph nodes: Cervical, supraclavicular, and axillary nodes normal. Resp: clear to auscultation bilaterally Back: symmetric, no curvature. ROM normal. No CVA tenderness. Cardio: regular rate and rhythm GI: soft, non-tender; bowel sounds normal; no masses,  no organomegaly Extremities: extremities normal, atraumatic, no cyanosis or edema Neurologic: Grossly normal Bilateral mastectomy scars are healing well. No evidence of infections.  LABORATORY DATA: Lab Results  Component Value Date   WBC 7.0 05/18/2012   HGB 13.2 05/18/2012   HCT 39.5 05/18/2012   MCV 93.7 05/18/2012   PLT 296 05/18/2012      Chemistry      Component Value Date/Time   NA 141 05/18/2012 1041   NA 138 11/17/2011 1514   K 4.2 05/18/2012 1041   K 4.1 11/17/2011 1514   CL 108* 05/18/2012 1041   CL 100 11/17/2011 1514   CO2 24 05/18/2012 1041   CO2 26 11/17/2011 1514   BUN 13.4 05/18/2012 1041   BUN 14 11/17/2011 1514   CREATININE 0.7 05/18/2012 1041   CREATININE 0.72 11/17/2011 1514      Component Value Date/Time   CALCIUM 9.2 05/18/2012 1041   CALCIUM 9.6 11/17/2011 1514   ALKPHOS 54 05/18/2012 1041   ALKPHOS 48 11/17/2011 1514   AST 17 05/18/2012 1041   AST 22 11/17/2011 1514   ALT 13 05/18/2012 1041   ALT 18 11/17/2011 1514   BILITOT 0.32 05/18/2012 1041   BILITOT 0.3 11/17/2011 1514    FINAL DIAGNOSIS Diagnosis 1. Lymph node, sentinel, biopsy, Left breast - THERE IS NO EVIDENCE OF CARCINOMA IN 1 OF 1 LYMPH NODE (0/1). 2. Lymph node, sentinel, biopsy, Left breast - THERE IS NO EVIDENCE OF CARCINOMA IN 1 OF 1 LYMPH NODE (0/1). 3. Lymph node, sentinel, biopsy, Left breast - THERE IS NO EVIDENCE OF CARCINOMA IN 1 OF 1 LYMPH NODE (0/1). 4. 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 - THE SURGICAL  RESECTION MARGINS ARE NEGATIVE FOR DUCTAL CARCINOMA. - SEE ONCOLOGY TABLE BELOW. 5. Breast, simple mastectomy, Right - LOBULAR NEOPLASIA (ATYPICAL LOBULAR HYPERPLASIA). - HEALING BIOPSY SITE. - FIBROCYSTIC CHANGES WITH CALCIFICATIONS. Microscopic Comment 4. BREAST, IN SITU CARCINOMA Specimen, including laterality: Left breast Procedure: Simple mastectomy Grade of carcinoma: Intermediate grade Necrosis: Present Estimated tumor size: (gross measurement): 5.8 cm Treatment effect: N/A Distance to closest margin: Focally 0.5 cm to the deep margin (gross measurement) Breast prognostic profile: [phone] Estrogen receptor: 99%, moderate staining intensity Progesterone receptor: 69%, moderate staining intensity 1 of 3 FINAL for Samantha Andersen, Samantha Andersen ([id number]) Microscopic Comment(continued) Lymph nodes: Examined: 3 Lymph nodes with metastasis: 0 TNM: pTis, pN0 (JBK:kh 11-29-11) JOSHUA KISH   RADIOGRAPHIC STUDIES:  Nm Sentinel Node Inj-no Rpt (breast)  11/24/2011  CLINICAL DATA: left breast cancer   Sulfur colloid was injected intradermally by the nuclear medicine  technologist for breast cancer sentinel node localization.      ASSESSMENT: 47 year old female with  #1 5.8 cm DCIS of the left breast that was ER positive PR positive. Patient is now status post bilateral mastectomies with immediate reconstruction. The left breast revealed a DCIS the right breast revealed atypical lobular hyperplasia. Postoperatively she is doing well. She has had expansion of the expanders she is very pleased with the results so far. In a few weeks she is gong to have implants placed.   PLAN:   #1 patient will continue to be seen by Dr. Odis Luster for reconstruction.  #2 patient and I discussed healthy lifestyle to exercise eating. We had an extensive discussion regarding this. Her husband and mother with her.  #3 I have checked iron studies on the patient today. If she has a low ferritin we will plan  on giving her feraheme   All questions were answered. The patient knows to call the clinic with any problems, questions or concerns. We can certainly see the patient much sooner if necessary.  I spent 25 minutes counseling the patient face to face. The total time spent in the appointment was 30 minutes.    Samantha Second, MD Medical/Oncology [facility] [phone] (beeper) [phone] (Office)  05/21/2012, 6:11 PM

## 2012-05-22 ENCOUNTER — Telehealth: Payer: Self-pay | Admitting: *Deleted

## 2012-05-22 NOTE — Telephone Encounter (Signed)
Pt called questioning about feraheme.  Discussed treatment.  Pt request earlier date d/t reconstruction on 5/20.  R/s infusion to 05/23/12 at 2:00.  Confirmed new appt date and time.  Pt asked length of time for surgery of replacement of expanders to implants.  Informed her per Dr. Odis Luster' office 2-2.5hrs.  Pt denies further needs or concerns at this time.  Encourage pt to call with further questions or concerns.

## 2012-05-23 ENCOUNTER — Ambulatory Visit (HOSPITAL_BASED_OUTPATIENT_CLINIC_OR_DEPARTMENT_OTHER): Payer: BC Managed Care – PPO

## 2012-05-23 VITALS — BP 103/69 | HR 81 | Temp 98.6°F | Resp 20

## 2012-05-23 DIAGNOSIS — D649 Anemia, unspecified: Secondary | ICD-10-CM

## 2012-05-23 DIAGNOSIS — D509 Iron deficiency anemia, unspecified: Secondary | ICD-10-CM

## 2012-05-23 MED ORDER — SODIUM CHLORIDE 0.9 % IV SOLN
1020.0000 mg | Freq: Once | INTRAVENOUS | Status: AC
  Administered 2012-05-23: 1020 mg via INTRAVENOUS
  Filled 2012-05-23: qty 34

## 2012-05-23 MED ORDER — SODIUM CHLORIDE 0.9 % IV SOLN
Freq: Once | INTRAVENOUS | Status: AC
  Administered 2012-05-23: 15:00:00 via INTRAVENOUS

## 2012-05-23 NOTE — Patient Instructions (Signed)
Ferumoxytol injection What is this medicine? FERUMOXYTOL is an iron complex. Iron is used to make healthy red blood cells, which carry oxygen and nutrients throughout the body. This medicine is used to treat iron deficiency anemia in people with chronic kidney disease. This medicine may be used for other purposes; ask your health care provider or pharmacist if you have questions. What should I tell my health care provider before I take this medicine? They need to know if you have any of these conditions: -anemia not caused by low iron levels -high levels of iron in the blood -magnetic resonance imaging (MRI) test scheduled -an unusual or allergic reaction to iron, other medicines, foods, dyes, or preservatives -pregnant or trying to get pregnant -breast-feeding How should I use this medicine? This medicine is for infusion into a vein. It is given by a health care professional in a hospital or clinic setting. Talk to your pediatrician regarding the use of this medicine in children. Special care may be needed. Overdosage: If you think you've taken too much of this medicine contact a poison control center or emergency room at once. Overdosage: If you think you have taken too much of this medicine contact a poison control center or emergency room at once. NOTE: This medicine is only for you. Do not share this medicine with others. What if I miss a dose? It is important not to miss your dose. Call your doctor or health care professional if you are unable to keep an appointment. What may interact with this medicine? This medicine may interact with the following medications: -other iron products This list may not describe all possible interactions. Give your health care provider a list of all the medicines, herbs, non-prescription drugs, or dietary supplements you use. Also tell them if you smoke, drink alcohol, or use illegal drugs. Some items may interact with your medicine. What should I watch  for while using this medicine? Visit your doctor or healthcare professional regularly. Tell your doctor or healthcare professional if your symptoms do not start to get better or if they get worse. You may need blood work done while you are taking this medicine. You may need to follow a special diet. Talk to your doctor. Foods that contain iron include: whole grains/cereals, dried fruits, beans, or peas, leafy green vegetables, and organ meats (liver, kidney). What side effects may I notice from receiving this medicine? Side effects that you should report to your doctor or health care professional as soon as possible: -allergic reactions like skin rash, itching or hives, swelling of the face, lips, or tongue -breathing problems -changes in blood pressure -feeling faint or lightheaded, falls -fever or chills -flushing, sweating, or hot feelings -swelling of the ankles or feet Side effects that usually do not require medical attention (Report these to your doctor or health care professional if they continue or are bothersome.): -diarrhea -headache -nausea, vomiting -stomach pain This list may not describe all possible side effects. Call your doctor for medical advice about side effects. You may report side effects to FDA at 1-800-FDA-1088. Where should I keep my medicine? This drug is given in a hospital or clinic and will not be stored at home. NOTE: This sheet is a summary. It may not cover all possible information. If you have questions about this medicine, talk to your doctor, pharmacist, or health care provider.  2013, [employer]. (09/19/2007 9:48:25 PM)  

## 2012-05-24 ENCOUNTER — Telehealth: Payer: Self-pay | Admitting: Medical Oncology

## 2012-05-24 NOTE — Telephone Encounter (Signed)
Normajean Baxter, RN in infustion room called to inform this nurse that patient, during her first tx of feraheme yesterday, asked if she is having another treatment because she is scheduled 05/19 for a tx, and this is the day before her reconstruction surgery.  Will review with MD/NP and inform pt.

## 2012-05-25 ENCOUNTER — Other Ambulatory Visit: Payer: Self-pay | Admitting: Emergency Medicine

## 2012-05-25 NOTE — Telephone Encounter (Signed)
No she will not get another treatment  Please cancel that

## 2012-05-28 ENCOUNTER — Ambulatory Visit: Payer: BC Managed Care – PPO

## 2012-08-01 ENCOUNTER — Other Ambulatory Visit: Payer: Self-pay | Admitting: Family Medicine

## 2012-08-01 DIAGNOSIS — G44009 Cluster headache syndrome, unspecified, not intractable: Secondary | ICD-10-CM

## 2012-08-05 ENCOUNTER — Ambulatory Visit
Admission: RE | Admit: 2012-08-05 | Discharge: 2012-08-05 | Disposition: A | Discharge status: Home / Self Care | Payer: BC Managed Care – PPO | Source: Ambulatory Visit | Attending: Family Medicine | Admitting: Family Medicine

## 2012-08-05 DIAGNOSIS — G44009 Cluster headache syndrome, unspecified, not intractable: Secondary | ICD-10-CM

## 2012-11-15 ENCOUNTER — Other Ambulatory Visit: Payer: Self-pay

## 2012-12-03 ENCOUNTER — Other Ambulatory Visit: Payer: Self-pay | Admitting: Endocrinology

## 2012-12-03 DIAGNOSIS — E049 Nontoxic goiter, unspecified: Secondary | ICD-10-CM

## 2012-12-05 ENCOUNTER — Ambulatory Visit
Admission: RE | Admit: 2012-12-05 | Discharge: 2012-12-05 | Disposition: A | Discharge status: Home / Self Care | Payer: BC Managed Care – PPO | Source: Ambulatory Visit | Attending: Endocrinology | Admitting: Endocrinology

## 2012-12-05 DIAGNOSIS — E049 Nontoxic goiter, unspecified: Secondary | ICD-10-CM

## 2013-02-18 ENCOUNTER — Ambulatory Visit (INDEPENDENT_AMBULATORY_CARE_PROVIDER_SITE_OTHER): Payer: BC Managed Care – PPO | Admitting: Cardiology

## 2013-02-18 ENCOUNTER — Encounter: Payer: Self-pay | Admitting: Cardiology

## 2013-02-18 ENCOUNTER — Other Ambulatory Visit: Payer: Self-pay | Admitting: General Surgery

## 2013-02-18 ENCOUNTER — Encounter: Payer: Self-pay | Admitting: General Surgery

## 2013-02-18 VITALS — BP 112/83 | HR 80 | Ht 62.0 in | Wt 159.0 lb

## 2013-02-18 DIAGNOSIS — E785 Hyperlipidemia, unspecified: Secondary | ICD-10-CM

## 2013-02-18 DIAGNOSIS — I498 Other specified cardiac arrhythmias: Secondary | ICD-10-CM

## 2013-02-18 DIAGNOSIS — R000 Tachycardia, unspecified: Secondary | ICD-10-CM | POA: Insufficient documentation

## 2013-02-18 DIAGNOSIS — R079 Chest pain, unspecified: Secondary | ICD-10-CM

## 2013-02-18 NOTE — Patient Instructions (Signed)
Your physician recommends that you continue on your current medications as directed. Please refer to the Current Medication list given to you today.  Your physician has requested that you have a stress echocardiogram. For further information please visit [email or web address]. Please follow instruction sheet as given.  Your physician has recommended that you wear a holter monitor. Holter monitors are medical devices that record the heart's electrical activity. Doctors most often use these monitors to diagnose arrhythmias. Arrhythmias are problems with the speed or rhythm of the heartbeat. The monitor is a small, portable device. You can wear one while you do your normal daily activities. This is usually used to diagnose what is causing palpitations/syncope (passing out).  Your physician recommends that you schedule a follow-up appointment in: 3-4 Weeks with Dr Meda Coffee

## 2013-02-18 NOTE — Progress Notes (Signed)
Patient ID: SHALU JANKOWSKI, female   DOB: [birth year], 48 y.o.   MRN: [id number]     Patient Name: FARZANA TANNENBAUM Date of Encounter: 02/18/2013  Primary Care Provider:  Luz Lex, MD Primary Cardiologist:  Ena Dawley, H  Problem List   Past Medical History  Diagnosis Date  . PCOS (polycystic ovarian syndrome)   . Anemia   . Hyperlipidemia   . Anxiety   . Hot flashes   . Joint pain   . Breast cancer     left  . Spondyloarthritis   . Complication of anesthesia     tachycardic, also reports that she needed increased anesth. because she has a high threshold for medicine/sedation  . PONV (postoperative nausea and vomiting)     also reports N&V accompanies anxiety  . 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 Abdominal hysterectomy    . Cesarean section      x2  . Simple mastectomy with axillary sentinel node biopsy  11/24/2011    Procedure: SIMPLE MASTECTOMY WITH AXILLARY SENTINEL NODE BIOPSY;  Surgeon: Haywood Lasso, MD;  Location: [place];  Service: General;  Laterality: Left;  Bilateral Total Mastectomy and Left Sentinel Node  . Simple mastectomy with axillary sentinel node biopsy  11/24/2011    Procedure: SIMPLE MASTECTOMY;  Surgeon: Haywood Lasso, MD;  Location: [place];  Service: General;  Laterality: Right;  . Tissue expander placement  11/24/2011    Procedure: TISSUE EXPANDER;  Surgeon: Crissie Reese, MD;  Location: [place];  Service: Plastics;  Laterality: Bilateral;    Allergies  Allergies  Allergen Reactions  . Doxycycline Shortness Of Breath  . Other Shortness Of Breath    SQUASH  . Tetracyclines & Related Shortness Of Breath and Other (See Comments)    Lethargy   . Ciprofloxacin     unknown  . Metformin And Related     Patient prefers to take Tevo brand metformin XR due to sensitivity rection to other brands   HPI  48 year old female with new diagnosis of left breast cancers DCIS stage 0 and  atypical lobular hyperplasia of the right breast. Patient is status post bilateral mastectomies with immediate reconstruction. She was referred to [place] for concerns of frequent tachycardias with 1 episode observed during diagnostic colonoscopy. The patient has history of polycystic ovary syndrome with chronic persistent bleeding over 26 years until she underwent hysterectomy 2 years ago. She still has preserved ovaries. She was also diagnosed with anemia as a consequence of chronic bleeding for which she takes iron pills. She was also diagnosed with vitamin D deficiency and most recently with celiac disease and was started on gluten-free diet just 3 weeks ago. Family history includes myocardial infarction in grandfather at age of 89. Patient describes several different type of chest pain some of them associated with palpitations, described as left-sided, sharp, and sometimes associated with dizziness. Those happen frequently and usually have sudden start. The patient is fairly active she walks 5K 5 times a week and usually doesn't have physical limitation in regards to shortness of breath, dizziness, chest pain or syncope. Patient records states that she has history of hyperlipidemia we can know the actual number of her primary care physician chest lipid profile. We will try to obtain.  Home Medications  Prior to Admission medications   Medication Sig Start Date End Date Taking? Authorizing Provider  diclofenac (VOLTAREN) 0.1 % ophthalmic solution 4 (four) times daily.   Yes  Historical Provider, MD  ibuprofen (ADVIL,MOTRIN) 100 MG tablet Take 100 mg by mouth as needed.   Yes Historical Provider, MD  meloxicam (MOBIC) 15 MG tablet Take 15 mg by mouth daily.  02/08/13  Yes Historical Provider, MD  metFORMIN (GLUCOPHAGE-XR) 500 MG 24 hr tablet Take 500 mg by mouth daily with breakfast.   Yes Historical Provider, MD  OVER THE COUNTER MEDICATION 1,200 mg. CATAPLEX   Yes Historical Provider, MD  VITAMIN D,  ERGOCALCIFEROL, PO Take by mouth daily.   Yes Historical Provider, MD   Family History  Family History  Problem Relation Age of Onset  . Lung cancer Maternal Grandfather   . Hemochromatosis Sister   . Prostate cancer Paternal Uncle 27  . Cancer Paternal Uncle 61    unknown type of cancer    Social History  History   Social History  . Marital Status: Married    Spouse Name: N/A    Number of Children: N/A  . Years of Education: N/A   Occupational History  . Not on file.   Social History Main Topics  . Smoking status: Former Smoker -- 2.00 packs/day for 4 years    Quit date: 10/04/1985  . Smokeless tobacco: Not on file  . Alcohol Use: Yes     Comment: occ.  . Drug Use: No  . Sexual Activity:    Other Topics Concern  . Not on file   Social History Narrative  . No narrative on file     Review of Systems, as per HPI, otherwise negative General:  No chills, fever, night sweats or weight changes.  Cardiovascular:  No chest pain, dyspnea on exertion, edema, orthopnea, palpitations, paroxysmal nocturnal dyspnea. Dermatological: No rash, lesions/masses Respiratory: No cough, dyspnea Urologic: No hematuria, dysuria Abdominal:   No nausea, vomiting, diarrhea, bright red blood per rectum, melena, or hematemesis Neurologic:  No visual changes, wkns, changes in mental status. All other systems reviewed and are otherwise negative except as noted above.  Physical Exam  Blood pressure 112/83, pulse 80, height 5\' 2"  (1.575 m), weight 159 lb (72.122 kg).  General: Pleasant, NAD Psych: Normal affect. Neuro: Alert and oriented X 3. Moves all extremities spontaneously. HEENT: Normal  Neck: Supple without bruits or JVD. Lungs:  Resp regular and unlabored, CTA. Heart: RRR no s3, s4, or murmurs. Abdomen: Soft, non-tender, non-distended, BS + x 4.  Extremities: No clubbing, cyanosis or edema. DP/PT/Radials 2+ and equal bilaterally.  Labs:  No results found for this basename:  CKTOTAL, CKMB, TROPONINI,  in the last 72 hours Lab Results  Component Value Date   WBC 7.0 05/18/2012   HGB 13.2 05/18/2012   HCT 39.5 05/18/2012   MCV 93.7 05/18/2012   PLT 296 05/18/2012   Accessory Clinical Findings  Echocardiogram - none  ECG - normal sinus rhythm, 80 beats per minute, normal EKG.   Assessment & Plan  Number pleasant 48 year old female with prior medical history of breast cancer in remission, vitamin D deficiency, anemia, PCOS and known insulin-dependent diabetes mellitus who is coming for concerns of tachycardia and atypical chest pain.  We will order a 48 hour Holter to evaluate for possible arrhythmias and heart rate. We'll also order a stress echocardiogram to evaluate for LV function and to rule out ischemia. Patient has atypical chest pain however she has multiple risk factors that include chronic inflammatory disease such as celiac disease and also diabetes mellitus and hyperlipidemia.  Follow in 3 weeks.  Dorothy Spark, MD, [facility]  02/18/2013, 10:09 AM

## 2013-03-06 ENCOUNTER — Encounter (INDEPENDENT_AMBULATORY_CARE_PROVIDER_SITE_OTHER): Payer: BC Managed Care – PPO

## 2013-03-06 ENCOUNTER — Encounter: Payer: Self-pay | Admitting: Radiology

## 2013-03-06 DIAGNOSIS — R000 Tachycardia, unspecified: Secondary | ICD-10-CM

## 2013-03-06 NOTE — Progress Notes (Signed)
Patient ID: Samantha Andersen, female   DOB: 1965-08-06, 47 y.o.   MRN: [id number] E Cardio 48 hr holter monitor applied

## 2013-03-13 ENCOUNTER — Other Ambulatory Visit (HOSPITAL_COMMUNITY): Payer: Self-pay | Admitting: Cardiology

## 2013-03-13 ENCOUNTER — Ambulatory Visit (HOSPITAL_COMMUNITY): Payer: BC Managed Care – PPO | Attending: Cardiology

## 2013-03-13 ENCOUNTER — Other Ambulatory Visit (HOSPITAL_COMMUNITY): Payer: BC Managed Care – PPO

## 2013-03-13 DIAGNOSIS — C50919 Malignant neoplasm of unspecified site of unspecified female breast: Secondary | ICD-10-CM | POA: Insufficient documentation

## 2013-03-13 DIAGNOSIS — R000 Tachycardia, unspecified: Secondary | ICD-10-CM

## 2013-03-13 DIAGNOSIS — Z87891 Personal history of nicotine dependence: Secondary | ICD-10-CM | POA: Insufficient documentation

## 2013-03-13 DIAGNOSIS — R42 Dizziness and giddiness: Secondary | ICD-10-CM | POA: Insufficient documentation

## 2013-03-13 DIAGNOSIS — E785 Hyperlipidemia, unspecified: Secondary | ICD-10-CM | POA: Insufficient documentation

## 2013-03-13 DIAGNOSIS — Z8249 Family history of ischemic heart disease and other diseases of the circulatory system: Secondary | ICD-10-CM | POA: Insufficient documentation

## 2013-03-13 DIAGNOSIS — R079 Chest pain, unspecified: Secondary | ICD-10-CM

## 2013-03-13 DIAGNOSIS — R072 Precordial pain: Secondary | ICD-10-CM

## 2013-03-13 DIAGNOSIS — Z9010 Acquired absence of unspecified breast and nipple: Secondary | ICD-10-CM | POA: Insufficient documentation

## 2013-03-13 NOTE — Progress Notes (Signed)
Stress Echocardiogram performed.  

## 2013-04-02 ENCOUNTER — Other Ambulatory Visit: Payer: BC Managed Care – PPO

## 2013-04-02 ENCOUNTER — Encounter: Payer: Self-pay | Admitting: Cardiology

## 2013-04-02 ENCOUNTER — Ambulatory Visit (INDEPENDENT_AMBULATORY_CARE_PROVIDER_SITE_OTHER): Payer: BC Managed Care – PPO | Admitting: Cardiology

## 2013-04-02 VITALS — BP 96/60 | HR 72 | Ht 62.0 in | Wt 155.8 lb

## 2013-04-02 DIAGNOSIS — R002 Palpitations: Secondary | ICD-10-CM

## 2013-04-02 DIAGNOSIS — E785 Hyperlipidemia, unspecified: Secondary | ICD-10-CM

## 2013-04-02 LAB — LIPID PANEL
Cholesterol: 267 mg/dL — ABNORMAL HIGH (ref 0–200)
HDL: 56.4 mg/dL (ref >39.00)
LDL Cholesterol: 194 mg/dL — ABNORMAL HIGH (ref 0–99)
Total CHOL/HDL Ratio: 5
Triglycerides: 84 mg/dL (ref 0.0–149.0)
VLDL: 16.8 mg/dL (ref 0.0–40.0)

## 2013-04-02 NOTE — Progress Notes (Signed)
Patient ID: YAREXI PAWLICKI, female   DOB: 10/20/1965, 48 y.o.   MRN: [id number]     Patient Name: Samantha Andersen Date of Encounter: 04/02/2013  Primary Care Provider:  Luz Lex, MD Primary Cardiologist:  Dorothy Spark  Problem List   Past Medical History  Diagnosis Date  . PCOS (polycystic ovarian syndrome)   . Anemia   . Hyperlipidemia   . Anxiety   . Hot flashes   . Joint pain   . Breast cancer     left  . Spondyloarthritis   . Complication of anesthesia     tachycardic, also reports that she needed increased anesth. because she has a high threshold for medicine/sedation  . PONV (postoperative nausea and vomiting)     also reports N&V accompanies anxiety  . 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 Abdominal hysterectomy    . Cesarean section      x2  . Simple mastectomy with axillary sentinel node biopsy  11/24/2011    Procedure: SIMPLE MASTECTOMY WITH AXILLARY SENTINEL NODE BIOPSY;  Surgeon: Haywood Lasso, MD;  Location: [place];  Service: General;  Laterality: Left;  Bilateral Total Mastectomy and Left Sentinel Node  . Simple mastectomy with axillary sentinel node biopsy  11/24/2011    Procedure: SIMPLE MASTECTOMY;  Surgeon: Haywood Lasso, MD;  Location: [place];  Service: General;  Laterality: Right;  . Tissue expander placement  11/24/2011    Procedure: TISSUE EXPANDER;  Surgeon: Crissie Reese, MD;  Location: [place];  Service: Plastics;  Laterality: Bilateral;    Allergies  Allergies  Allergen Reactions  . Doxycycline Shortness Of Breath  . Other Shortness Of Breath    SQUASH  . Tetracyclines & Related Shortness Of Breath and Other (See Comments)    Lethargy   . Ciprofloxacin     unknown  . Metformin And Related     Patient prefers to take Tevo brand metformin XR due to sensitivity rection to other brands   HPI  48 year old female with new diagnosis of left breast cancers DCIS stage 0 and  atypical lobular hyperplasia of the right breast. Patient is status post bilateral mastectomies with immediate reconstruction. She was referred to [place] for concerns of frequent tachycardias with 1 episode observed during diagnostic colonoscopy. The patient has history of polycystic ovary syndrome with chronic persistent bleeding over 26 years until she underwent hysterectomy 2 years ago. She still has preserved ovaries. She was also diagnosed with anemia as a consequence of chronic bleeding for which she takes iron pills. She was also diagnosed with vitamin D deficiency and most recently with celiac disease and was started on gluten-free diet just 3 weeks ago. Family history includes myocardial infarction in grandfather at age of 60. Patient describes several different type of chest pain some of them associated with palpitations, described as left-sided, sharp, and sometimes associated with dizziness. Those happen frequently and usually have sudden start. The patient is fairly active she walks 5K 5 times a week and usually doesn't have physical limitation in regards to shortness of breath, dizziness, chest pain or syncope. Patient records states that she has history of hyperlipidemia we can know the actual number of her primary care physician chest lipid profile. We will try to obtain.  The patient is coming after one month, she reports that she continues having palpitations that last seconds to minutes and sometimes are associated with dizziness. However she hasn't had any in the  last couple of weeks and didn't have any during her Holter monitoring. Once the, they last for few days. She also mentions that when she had MRI of her brain there were abnormal lesions and wonders if those might be related to mini strokes.  Home Medications  Prior to Admission medications   Medication Sig Start Date End Date Taking? Authorizing Provider  diclofenac (VOLTAREN) 0.1 % ophthalmic solution 4 (four) times daily.   Yes  Historical Provider, MD  ibuprofen (ADVIL,MOTRIN) 100 MG tablet Take 100 mg by mouth as needed.   Yes Historical Provider, MD  meloxicam (MOBIC) 15 MG tablet Take 15 mg by mouth daily.  02/08/13  Yes Historical Provider, MD  metFORMIN (GLUCOPHAGE-XR) 500 MG 24 hr tablet Take 500 mg by mouth daily with breakfast.   Yes Historical Provider, MD  OVER THE COUNTER MEDICATION 1,200 mg. CATAPLEX   Yes Historical Provider, MD  VITAMIN D, ERGOCALCIFEROL, PO Take by mouth daily.   Yes Historical Provider, MD   Family History  Family History  Problem Relation Age of Onset  . Lung cancer Maternal Grandfather   . Hemochromatosis Sister   . Prostate cancer Paternal Uncle 78  . Cancer Paternal Uncle 81    unknown type of cancer    Social History  History   Social History  . Marital Status: Married    Spouse Name: N/A    Number of Children: N/A  . Years of Education: N/A   Occupational History  . Not on file.   Social History Main Topics  . Smoking status: Former Smoker -- 2.00 packs/day for 4 years    Quit date: 10/04/1985  . Smokeless tobacco: Not on file  . Alcohol Use: Yes     Comment: occ.  . Drug Use: No  . Sexual Activity:    Other Topics Concern  . Not on file   Social History Narrative  . No narrative on file     Review of Systems, as per HPI, otherwise negative General:  No chills, fever, night sweats or weight changes.  Cardiovascular:  No chest pain, dyspnea on exertion, edema, orthopnea, palpitations, paroxysmal nocturnal dyspnea. Dermatological: No rash, lesions/masses Respiratory: No cough, dyspnea Urologic: No hematuria, dysuria Abdominal:   No nausea, vomiting, diarrhea, bright red blood per rectum, melena, or hematemesis Neurologic:  No visual changes, wkns, changes in mental status. All other systems reviewed and are otherwise negative except as noted above.  Physical Exam  Blood pressure 96/60, pulse 72, height 5\' 2"  (1.575 m), weight 155 lb 12.8 oz  (70.67 kg).  General: Pleasant, NAD Psych: Normal affect. Neuro: Alert and oriented X 3. Moves all extremities spontaneously. HEENT: Normal  Neck: Supple without bruits or JVD. Lungs:  Resp regular and unlabored, CTA. Heart: RRR no s3, s4, or murmurs. Abdomen: Soft, non-tender, non-distended, BS + x 4.  Extremities: No clubbing, cyanosis or edema. DP/PT/Radials 2+ and equal bilaterally.  Labs:  No results found for this basename: CKTOTAL, CKMB, TROPONINI,  in the last 72 hours Lab Results  Component Value Date   WBC 7.0 05/18/2012   HGB 13.2 05/18/2012   HCT 39.5 05/18/2012   MCV 93.7 05/18/2012   PLT 296 05/18/2012   Accessory Clinical Findings  Echocardiogram - Study Conclusions  - HPI and indications: Resting chest pain. - Stress ECG conclusions: The stress ECG was normal. - Baseline: LV global systolic function was normal. The estimated LV ejection fraction was 60%. Normal wall motion; no LV regional wall motion abnormalities. -  Peak stress: LV global systolic function was vigorous. Normal wall motion; no LV regional wall motion abnormalities. - Impressions: There is normal increase in thickening and contractility in all segments. This is a normal stress echo. Impressions:  - There is normal increase in thickening and contractility in all segments. This is a normal stress echo.  ECG - normal sinus rhythm, 80 beats per minute, normal EKG.   Assessment & Plan  Number pleasant 48 year old female with prior medical history of breast cancer in remission, vitamin D deficiency, anemia, PCOS and known insulin-dependent diabetes mellitus who is coming for concerns of tachycardia and atypical chest pain.  A 48 hour Holter to evaluate for possible arrhythmias and heart rate was completely normal there were no arrhythmias. Her average heart rate was normal. Since her episodes of palpitations come in clusters we will order another 48-hour Holter monitor and she will call in once her  episodes start. I reviewed her MRI of brain and it states that there are foci of abnormal white matter signal within the cerebral hemispheres. The primary differential diagnosis is multiple sclerosis versus migraine related foci. That would make concern for atrial fibrillation and ischemic stroke less probable.   Her stress echocardiogram was completely normal with normal heart anatomy and no concern for ischemia.  Followup in 1 year  Dorothy Spark, MD, [facility] 04/02/2013, 10:05 AM

## 2013-04-02 NOTE — Addendum Note (Signed)
Addended by: Eulis Foster on: 04/02/2013 10:29 AM   Modules accepted: Orders

## 2013-04-02 NOTE — Patient Instructions (Signed)
Your physician has recommended that you wear a holter monitor. Holter monitors are medical devices that record the heart's electrical activity. Doctors most often use these monitors to diagnose arrhythmias. Arrhythmias are problems with the speed or rhythm of the heartbeat. The monitor is a small, portable device. You can wear one while you do your normal daily activities. This is usually used to diagnose what is causing palpitations/syncope (passing out).  Call when you start to have symptoms and we will get you scheduled to come in to have holter put on.  Your physician wants you to follow-up in: 1 year with Dr. Meda Coffee. You will receive a reminder letter in the mail two months in advance. If you don't receive a letter, please call our office to schedule the follow-up appointment.

## 2013-04-03 ENCOUNTER — Telehealth: Payer: Self-pay | Admitting: Cardiology

## 2013-04-03 DIAGNOSIS — Z79899 Other long term (current) drug therapy: Secondary | ICD-10-CM

## 2013-04-03 MED ORDER — ATORVASTATIN CALCIUM 10 MG PO TABS: 10.0000 mg | ORAL_TABLET | Freq: Every day | ORAL | Status: DC

## 2013-04-03 NOTE — Telephone Encounter (Signed)
Message copied by Alcario Drought on Wed Apr 03, 2013  1:48 PM ------      Message from: Dorothy Spark      Created: Wed Apr 03, 2013  1:41 PM       Could you call her - her LDL is significantly elevated, I would start atorvastatin 10 mg po QHS and repeat liver enzymes in 3-4 weeks. ------

## 2013-04-03 NOTE — Telephone Encounter (Signed)
Pt notified. Meds updated and labs ordered.  

## 2013-05-06 ENCOUNTER — Other Ambulatory Visit: Payer: BC Managed Care – PPO

## 2013-05-10 ENCOUNTER — Telehealth: Payer: Self-pay | Admitting: Oncology

## 2013-05-10 NOTE — Telephone Encounter (Signed)
LVM ADVISING APPT 5/11 HAS BEEN MOVED TO 6/8 @ 9AM WITH DR. Laurance Flatten DUE TO KK LOA.

## 2013-05-20 ENCOUNTER — Ambulatory Visit: Payer: BC Managed Care – PPO | Admitting: Oncology

## 2013-06-17 ENCOUNTER — Ambulatory Visit: Payer: BC Managed Care – PPO

## 2013-06-19 ENCOUNTER — Other Ambulatory Visit: Payer: Self-pay | Admitting: *Deleted

## 2013-06-19 ENCOUNTER — Telehealth: Payer: Self-pay | Admitting: Oncology

## 2013-06-19 DIAGNOSIS — D509 Iron deficiency anemia, unspecified: Secondary | ICD-10-CM

## 2013-06-19 DIAGNOSIS — Z148 Genetic carrier of other disease: Secondary | ICD-10-CM

## 2013-06-19 DIAGNOSIS — C50419 Malignant neoplasm of upper-outer quadrant of unspecified female breast: Secondary | ICD-10-CM

## 2013-06-19 NOTE — Telephone Encounter (Signed)
added lab for 6.11 jper pof...pt already aware

## 2013-06-20 ENCOUNTER — Ambulatory Visit: Payer: BC Managed Care – PPO

## 2013-06-20 ENCOUNTER — Telehealth: Payer: Self-pay | Admitting: Hematology and Oncology

## 2013-06-20 ENCOUNTER — Ambulatory Visit (HOSPITAL_BASED_OUTPATIENT_CLINIC_OR_DEPARTMENT_OTHER): Payer: BC Managed Care – PPO | Admitting: Hematology and Oncology

## 2013-06-20 ENCOUNTER — Other Ambulatory Visit (HOSPITAL_BASED_OUTPATIENT_CLINIC_OR_DEPARTMENT_OTHER): Payer: BC Managed Care – PPO

## 2013-06-20 VITALS — BP 118/78 | HR 90 | Temp 99.0°F | Resp 18 | Ht 62.0 in | Wt 162.5 lb

## 2013-06-20 DIAGNOSIS — E538 Deficiency of other specified B group vitamins: Secondary | ICD-10-CM

## 2013-06-20 DIAGNOSIS — D509 Iron deficiency anemia, unspecified: Secondary | ICD-10-CM

## 2013-06-20 DIAGNOSIS — C50419 Malignant neoplasm of upper-outer quadrant of unspecified female breast: Secondary | ICD-10-CM

## 2013-06-20 DIAGNOSIS — D0590 Unspecified type of carcinoma in situ of unspecified breast: Secondary | ICD-10-CM

## 2013-06-20 DIAGNOSIS — Z170 Estrogen receptor positive status [ER+]: Secondary | ICD-10-CM

## 2013-06-20 DIAGNOSIS — Z148 Genetic carrier of other disease: Secondary | ICD-10-CM

## 2013-06-20 LAB — CBC WITH DIFFERENTIAL/PLATELET
BASO%: 0.6 % (ref 0.0–2.0)
BASOS ABS: 0.1 10*3/uL (ref 0.0–0.1)
EOS ABS: 0.2 10*3/uL (ref 0.0–0.5)
EOS%: 2 % (ref 0.0–7.0)
HCT: 42.3 % (ref 34.8–46.6)
HGB: 13.9 g/dL (ref 11.6–15.9)
LYMPH%: 24.8 % (ref 14.0–49.7)
MCH: 31.1 pg (ref 25.1–34.0)
MCHC: 32.8 g/dL (ref 31.5–36.0)
MCV: 94.8 fL (ref 79.5–101.0)
MONO#: 0.4 10*3/uL (ref 0.1–0.9)
MONO%: 5.1 % (ref 0.0–14.0)
NEUT%: 67.5 % (ref 38.4–76.8)
NEUTROS ABS: 5.6 10*3/uL (ref 1.5–6.5)
PLATELETS: 293 10*3/uL (ref 145–400)
RBC: 4.47 10*6/uL (ref 3.70–5.45)
RDW: 12.9 % (ref 11.2–14.5)
WBC: 8.3 10*3/uL (ref 3.9–10.3)
lymph#: 2.1 10*3/uL (ref 0.9–3.3)

## 2013-06-20 LAB — COMPREHENSIVE METABOLIC PANEL (CC13)
ALK PHOS: 58 U/L (ref 40–150)
ALT: 25 U/L (ref 0–55)
AST: 18 U/L (ref 5–34)
Albumin: 3.7 g/dL (ref 3.5–5.0)
Anion Gap: 9 mEq/L (ref 3–11)
BILIRUBIN TOTAL: 0.31 mg/dL (ref 0.20–1.20)
BUN: 11.3 mg/dL (ref 7.0–26.0)
CO2: 24 mEq/L (ref 22–29)
CREATININE: 0.8 mg/dL (ref 0.6–1.1)
Calcium: 9.2 mg/dL (ref 8.4–10.4)
Chloride: 108 mEq/L (ref 98–109)
GLUCOSE: 113 mg/dL (ref 70–140)
Potassium: 4.2 mEq/L (ref 3.5–5.1)
Sodium: 140 mEq/L (ref 136–145)
Total Protein: 7.2 g/dL (ref 6.4–8.3)

## 2013-06-20 LAB — IRON AND TIBC CHCC
%SAT: 35 % (ref 21–57)
IRON: 95 ug/dL (ref 41–142)
TIBC: 271 ug/dL (ref 236–444)
UIBC: 176 ug/dL (ref 120–384)

## 2013-06-20 LAB — FERRITIN CHCC: FERRITIN: 123 ng/mL (ref 9–269)

## 2013-06-20 NOTE — Progress Notes (Signed)
OFFICE PROGRESS NOTE  CC  Samantha Lex, MD [address], [address] [place] [address] Dr. Gery Pray Dr. Neldon Mc  Chief complaint: Followup visit for breast cancer  DIAGNOSIS:  left breast cancers DCIS stage 0 and atypical lobular hyperplasia of the right breast.   PRIOR THERAPY: As per previously documented Dr. Laurelyn Sickle note:  #1 patient was originally seen in the multidisciplinary breast clinic on 10/05/2011. 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 Because of this patient underwent left mastectomy but she also opted for a right mastectomy. This was performed on 11/24/2011 with immediate reconstruction with tissue expanders.  #2 patient has had immediate reconstruction with tissue expander she is seeing Dr. Crissie Reese.  #3 patient has had genetic counseling and testing and she was found to be negative for BRCA1 and 2 gene mutation.  CURRENT THERAPY:  observation  INTERVAL HISTORY: Samantha Andersen 48 y.o. female returns for followup visit post mastectomy. Since the time of last visit she was diagnosed with  B12, and  vitamin D deficiency and was started on  B12 and vitamin D supplementation. Recently she started having intermittent episodes of right facial numbness and also stumbling on the right side. She denies any headaches or blurred vision. She was started on statin medication for her hyperlipidemia and see complaints of  muscle aches. Her last iron infusion was shortly after the previous visit and she was wondering what her iron levels today. Otherwise she has no fevers chills night sweats headaches no shortness of breath no chest pains or palpitations no dizziness.   MEDICAL HISTORY: Past Medical History  Diagnosis Date  . PCOS (polycystic ovarian syndrome)   . Anemia   . Hyperlipidemia   . Anxiety   . Hot flashes   . Joint pain   . Breast cancer     left   . Spondyloarthritis   . Complication of anesthesia     tachycardic, also reports that she needed increased anesth. because she has a high threshold for medicine/sedation  . PONV (postoperative nausea and vomiting)     also reports N&V accompanies anxiety  . 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  MEDICATIONS:  Current Outpatient Prescriptions  Medication Sig Dispense Refill  . atorvastatin (LIPITOR) 10 MG tablet Take 1 tablet (10 mg total) by mouth daily.  90 tablet  3  . Cyanocobalamin (VITAMIN B-12 SL) Place 2,500 mcg under the tongue daily.      . diclofenac sodium (VOLTAREN) 1 % GEL Apply topically 4 (four) times daily as needed.      Marland Kitchen ibuprofen (ADVIL,MOTRIN) 100 MG tablet Take 100 mg by mouth as needed.      . meloxicam (MOBIC) 15 MG tablet Take 15 mg by mouth daily as needed.       . metFORMIN (GLUCOPHAGE-XR) 500 MG 24 hr tablet Take 500 mg by mouth daily with breakfast.      . VITAMIN D, ERGOCALCIFEROL, PO Take 50,000 Units by mouth once a week.        No current facility-administered medications for this visit.    SURGICAL HISTORY:  Past Surgical History  Procedure Laterality Date  . Abdominal hysterectomy    . Cesarean section      x2  . Simple mastectomy with axillary sentinel node biopsy  11/24/2011    Procedure: SIMPLE MASTECTOMY WITH AXILLARY SENTINEL NODE BIOPSY;  Surgeon: Haywood Lasso, MD;  Location: [facility];  Service: General;  Laterality: Left;  Bilateral Total Mastectomy and Left Sentinel Node  . Simple mastectomy with axillary sentinel node biopsy  11/24/2011    Procedure: SIMPLE MASTECTOMY;  Surgeon: Haywood Lasso, MD;  Location: [facility];  Service: General;  Laterality: Right;  . Tissue expander placement  11/24/2011    Procedure: TISSUE EXPANDER;  Surgeon: Crissie Reese, MD;  Location: [facility];  Service: Plastics;  Laterality:  Bilateral;    REVIEW OF SYSTEMS:  Pertinent items are noted in HPI.   HEALTH MAINTENANCE:   PHYSICAL EXAMINATION: Blood pressure 118/78, pulse 90, temperature 99 F (37.2 C), temperature source Oral, resp. rate 18, height 5' 2"  (1.575 m), weight 162 lb 8 oz (73.71 kg). Body mass index is 29.71 kg/(m^2). ECOG PERFORMANCE STATUS: 0 - Asymptomatic HEENT PERRLA, sclerae-anicteric, conjunctiva no pallor neck supple no JVD no thyromegaly  Head: Normocephalic, without obvious abnormality, atraumatic Lymph nodes: Cervical, supraclavicular, and axillary nodes normal. Resp: clear to auscultation bilaterally Back: symmetric, no curvature. ROM normal. No CVA tenderness. Cardio: regular rate and rhythm GI: soft, non-tender; bowel sounds normal; no masses,  no organomegaly Extremities: extremities normal, atraumatic, no cyanosis or edema Neurologic: Grossly normal Bilateral breast implants in place. No evidence of any abnormalities noted  LABORATORY DATA: Lab Results  Component Value Date   WBC 8.3 06/20/2013   HGB 13.9 06/20/2013   HCT 42.3 06/20/2013   MCV 94.8 06/20/2013   PLT 293 06/20/2013      Chemistry      Component Value Date/Time   NA 140 06/20/2013 1235   NA 138 11/17/2011 1514   K 4.2 06/20/2013 1235   K 4.1 11/17/2011 1514   CL 108* 05/18/2012 1041   CL 100 11/17/2011 1514   CO2 24 06/20/2013 1235   CO2 26 11/17/2011 1514   BUN 11.3 06/20/2013 1235   BUN 14 11/17/2011 1514   CREATININE 0.8 06/20/2013 1235   CREATININE 0.72 11/17/2011 1514      Component Value Date/Time   CALCIUM 9.2 06/20/2013 1235   CALCIUM 9.6 11/17/2011 1514   ALKPHOS 58 06/20/2013 1235   ALKPHOS 48 11/17/2011 1514   AST 18 06/20/2013 1235   AST 22 11/17/2011 1514   ALT 25 06/20/2013 1235   ALT 18 11/17/2011 1514   BILITOT 0.31 06/20/2013 1235   BILITOT 0.3 11/17/2011 1514    FINAL DIAGNOSIS Diagnosis 1. Lymph node, sentinel, biopsy, Left breast - THERE IS NO EVIDENCE OF CARCINOMA IN 1 OF 1 LYMPH NODE (0/1). 2.  Lymph node, sentinel, biopsy, Left breast - THERE IS NO EVIDENCE OF CARCINOMA IN 1 OF 1 LYMPH NODE (0/1). 3. Lymph node, sentinel, biopsy, Left breast - THERE IS NO EVIDENCE OF CARCINOMA IN 1 OF 1 LYMPH NODE (0/1). 4. 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 - THE SURGICAL RESECTION MARGINS ARE NEGATIVE FOR DUCTAL CARCINOMA. - SEE ONCOLOGY TABLE BELOW. 5. Breast, simple mastectomy, Right - LOBULAR NEOPLASIA (ATYPICAL LOBULAR HYPERPLASIA). - HEALING BIOPSY SITE. - FIBROCYSTIC CHANGES WITH CALCIFICATIONS. Microscopic Comment 4. BREAST, IN SITU CARCINOMA Specimen, including laterality: Left breast Procedure: Simple mastectomy Grade of carcinoma: Intermediate grade Necrosis: Present Estimated tumor size: (gross measurement): 5.8 cm Treatment effect: N/A Distance to closest margin: Focally 0.5 cm to the deep margin (gross measurement) Breast prognostic profile: [id number] Estrogen receptor: 99%,  moderate staining intensity Progesterone receptor: 69%, moderate staining intensity 1 of 3 FINAL for GRACY, EHLY ([id number]) Microscopic Comment(continued) Lymph nodes: Examined: 3 Lymph nodes with metastasis: 0 TNM: pTis, pN0 (JBK:kh 11-29-11) JOSHUA KISH   RADIOGRAPHIC STUDIES:  Nm Sentinel Node Inj-no Rpt (breast)  11/24/2011  CLINICAL DATA: left breast cancer   Sulfur colloid was injected intradermally by the nuclear medicine  technologist for breast cancer sentinel node localization.      ASSESSMENT: 48 year old female with  #1 DCIS of the left breast- ER positive PR positive/ atypical lobular hyperplasia of right breast . Patient is now status post bilateral mastectomies with bilateral breast implants.   #2 B12 deficiency, vitamin D deficiency and also history of iron deficiency  #3 right facial numbness and unsteady gait  PLAN:  Aundra's neurological symptoms could be related to  underlying vitamin B12 deficiency however we'll repeat  MRI of the brain to assess for progression of abnormal white matter foci that was diagnosed in July 2014. Continue vitamin B 12 supplementation. I've asked her to follow up with her neurologist Dr. Dara Lords for further management as soon as possible  I have reviewed CBC and CMP and also iron indices which were performed today and she does not need any IV iron supplementation at this time  Next followup visit in 6 months with CBC differential and CMP. If the MRI of the brain shows any abnormalities she will be seen in the clinic earlier than her scheduled appointment. She understood and  in agreement with the current plan of care  All questions were answered. The patient knows to call the clinic with any problems, questions or concerns.   I spent 20 minutes counseling the patient face to face. The total time spent in the appointment was 30 minutes.     Wilmon Arms , MD Medical/Oncology [facility] [phone] (Office)  06/20/2013, 5:42 PM

## 2013-06-20 NOTE — Telephone Encounter (Signed)
, °

## 2013-06-29 ENCOUNTER — Ambulatory Visit
Admission: RE | Admit: 2013-06-29 | Discharge: 2013-06-29 | Disposition: A | Discharge status: Home / Self Care | Payer: BC Managed Care – PPO | Source: Ambulatory Visit | Attending: Hematology and Oncology | Admitting: Hematology and Oncology

## 2013-06-29 DIAGNOSIS — C50419 Malignant neoplasm of upper-outer quadrant of unspecified female breast: Secondary | ICD-10-CM

## 2013-06-29 MED ORDER — GADOBENATE DIMEGLUMINE 529 MG/ML IV SOLN
15.0000 mL | Freq: Once | INTRAVENOUS | Status: AC | PRN
  Administered 2013-06-29: 15 mL via INTRAVENOUS

## 2013-07-01 ENCOUNTER — Telehealth: Payer: Self-pay

## 2013-07-01 NOTE — Telephone Encounter (Signed)
Message copied by Prentiss Bells on Mon Jul 01, 2013  1:33 PM ------      Message from: Wilmon Arms      Created: Mon Jul 01, 2013 11:58 AM      Regarding: MRI report-fax       Pl Fax MRI of brain report to PCP for further evaluation and to assess the need of Neurology referral ------

## 2013-07-01 NOTE — Telephone Encounter (Signed)
Per Dr. Earnest Conroy - MRI faxed to Dr. Corinna Capra.  Original sent to scan.

## 2013-11-01 ENCOUNTER — Other Ambulatory Visit: Payer: Self-pay | Admitting: Family Medicine

## 2013-11-01 DIAGNOSIS — R1011 Right upper quadrant pain: Secondary | ICD-10-CM

## 2013-11-12 ENCOUNTER — Ambulatory Visit
Admission: RE | Admit: 2013-11-12 | Discharge: 2013-11-12 | Disposition: A | Discharge status: Home / Self Care | Payer: BC Managed Care – PPO | Source: Ambulatory Visit | Attending: Family Medicine | Admitting: Family Medicine

## 2013-11-12 DIAGNOSIS — R1011 Right upper quadrant pain: Secondary | ICD-10-CM

## 2013-12-09 ENCOUNTER — Encounter: Payer: BC Managed Care – PPO | Attending: Family Medicine | Admitting: Skilled Nursing Facility1

## 2013-12-09 ENCOUNTER — Encounter: Payer: Self-pay | Admitting: Skilled Nursing Facility1

## 2013-12-09 VITALS — Ht 62.0 in | Wt 174.0 lb

## 2013-12-09 DIAGNOSIS — Z713 Dietary counseling and surveillance: Secondary | ICD-10-CM | POA: Insufficient documentation

## 2013-12-09 DIAGNOSIS — E669 Obesity, unspecified: Secondary | ICD-10-CM | POA: Insufficient documentation

## 2013-12-09 DIAGNOSIS — E785 Hyperlipidemia, unspecified: Secondary | ICD-10-CM

## 2013-12-09 NOTE — Progress Notes (Signed)
Medical Nutrition Therapy:  Appt start time: 0915 end time:  0354.   Assessment:  Primary concerns today: Referred for obesity and hyperlipidemia.   The patient wants to discuss all of her medical conditions and what she should/should not be eating as well as how to lose weight. Patient states: ever since she was bitten by a tick she has been diagnosed with: celiac disease, ankylosing spondilytis, and cancer. She cooks and shops for the household with her husband picking up the responsibility sometimes. She eats at the kitchen table and eats fast. Patient also states she has had trouble swallowing food items especially when they are hot/warm for the past couple of weeks but can swallow thicker liquids/thinner liquids more easily. She states the swallowing problem is the worst when she does not eat for a few hours (3-4 hours) which is a common occurrence for her. She states her swallowing issues may be due to her thyroid which they are testing in the next couple of days. From all of her medical diagnosis she is confused about what to eat, how much to eat, and why she cannot lose weight. She states she is usually around 150lbs and a reaction she had with lisinopril with alcohol created an adverse reaction which lead to her weight gain. She is currently on a gluten free diet due to her celiacs diagnosis. She also has PCOS which she has not seen a dietitian for so this dietitian suggested she see Grafton Folk MS, RD, LDN, CSP for her PCOS which may be another cause of her weight gain/difficulty with weight loss compounding with her other medical diagnoses/medications.    Preferred Learning Style:   Auditory  Learning Readiness:   Change in progress   MEDICATIONS: metformin   DIETARY INTAKE:  Usual eating pattern includes 3 meals and 3 snacks per day.  Everyday foods include eating out.  Avoided foods include wheat.    24-hr recall:  B ( AM): decaf mocha from [employer] coffee, sausage with mushroom  and onion, apple  Snk ( AM): chips-fritos L ( PM): Moes barrito bowl-black beans, rice, chicken, vegetables, sour cream---[employer] salads-----salsarittas  Snk ( PM): concord grapes D ( PM): salad with chicken Snk ( PM): occasionally carrots Beverages: water, decaf coffee, herbal tea  Eats out a lot  Usual physical activity: mon,wed, fri-thirty minutes on oliptical and weights-------tuesd, thurs a 5k walk   Estimated energy needs: 2000 calories 225 g carbohydrates 150 g protein 56 g fat  Progress Towards Goal(s):  In progress.   Nutritional Diagnosis:  NB-1.1 Food and nutrition-related knowledge deficit As related to a plethora of new medical diagnoses which can be ameliorated with nutrition.  As evidenced by patient report of being confused about her new medical diagnosis and not knowing how to eat .    Intervention:  Nutrition counseling for hyperlipidemia. Dietitian educated the patient on nutrients of concern with: hyperlipidemia (unsaturated verses saturated fat), hemochromatosis (higher iron foods and vitamin C), PCOS (carbohydrates), and cancer (antioxidant rich foods).   Goals:  -Try to eat more plant based fats that have unsaturated fat -Half cup serving size for grain products -15 grams of carbohydrate for a snack and 30 grams of carbohydrate for a meal -White meat rather than dark meat and lean meats rather than fatty meats -Try to eat fatty fish such as salmon 2-3 times a week -Keep your doctor aware of everything new and changing with each condition -Gather your questions for your appointment with Mickel Baas -Keep up with  your positive attitude and stay optimistic!    Teaching Method Utilized:   Auditory   Handouts given during visit include:  Celiac Nutrition (NCM)  Tryglyceride Nutrition (NCM)  Weight Loss Tips  Apps and Cookbooks for weight management and heart health  Barriers to learning/adherence to lifestyle change: several medical conditions requiring  different nutritional needs and parameters.  Demonstrated degree of understanding via:  Teach Back   Monitoring/Evaluation:  Dietary intake, exercise, and body weight in 2-3 week(s).

## 2013-12-09 NOTE — Patient Instructions (Signed)
-  Try to eat more plant based fats that have unsaturated fat -Half cup serving size for grain products -15 grams of carbohydrate for a snack and 30 grams of carbohydrate for a meal -White meat rather than dark meat and lean meats rather than fatty meats -Try to eat fatty fish such as salmon 2-3 times a week -Keep your doctor aware of everything new and changing with each condition -Gather your questions for your appointment with Mickel Baas -Keep with your positive attitude and stay optimistic!

## 2013-12-13 ENCOUNTER — Other Ambulatory Visit: Payer: Self-pay | Admitting: Endocrinology

## 2013-12-13 DIAGNOSIS — E049 Nontoxic goiter, unspecified: Secondary | ICD-10-CM

## 2013-12-17 ENCOUNTER — Ambulatory Visit
Admission: RE | Admit: 2013-12-17 | Discharge: 2013-12-17 | Disposition: A | Discharge status: Home / Self Care | Payer: BC Managed Care – PPO | Source: Ambulatory Visit | Attending: Endocrinology | Admitting: Endocrinology

## 2013-12-17 DIAGNOSIS — E049 Nontoxic goiter, unspecified: Secondary | ICD-10-CM

## 2013-12-23 ENCOUNTER — Other Ambulatory Visit: Payer: Self-pay | Admitting: *Deleted

## 2013-12-23 DIAGNOSIS — C50419 Malignant neoplasm of upper-outer quadrant of unspecified female breast: Secondary | ICD-10-CM

## 2013-12-24 ENCOUNTER — Telehealth: Payer: Self-pay | Admitting: Hematology and Oncology

## 2013-12-24 ENCOUNTER — Other Ambulatory Visit (HOSPITAL_BASED_OUTPATIENT_CLINIC_OR_DEPARTMENT_OTHER): Payer: BC Managed Care – PPO

## 2013-12-24 ENCOUNTER — Ambulatory Visit (HOSPITAL_BASED_OUTPATIENT_CLINIC_OR_DEPARTMENT_OTHER): Payer: BC Managed Care – PPO | Admitting: Hematology and Oncology

## 2013-12-24 DIAGNOSIS — C50419 Malignant neoplasm of upper-outer quadrant of unspecified female breast: Secondary | ICD-10-CM

## 2013-12-24 DIAGNOSIS — D0512 Intraductal carcinoma in situ of left breast: Secondary | ICD-10-CM

## 2013-12-24 DIAGNOSIS — N62 Hypertrophy of breast: Secondary | ICD-10-CM

## 2013-12-24 LAB — CBC WITH DIFFERENTIAL/PLATELET
BASO%: 0.8 % (ref 0.0–2.0)
Basophils Absolute: 0.1 10*3/uL (ref 0.0–0.1)
EOS%: 1.8 % (ref 0.0–7.0)
Eosinophils Absolute: 0.2 10*3/uL (ref 0.0–0.5)
HCT: 41.2 % (ref 34.8–46.6)
HGB: 13.3 g/dL (ref 11.6–15.9)
LYMPH%: 21.1 % (ref 14.0–49.7)
MCH: 30.2 pg (ref 25.1–34.0)
MCHC: 32.2 g/dL (ref 31.5–36.0)
MCV: 93.8 fL (ref 79.5–101.0)
MONO#: 0.6 10*3/uL (ref 0.1–0.9)
MONO%: 5.9 % (ref 0.0–14.0)
NEUT#: 7.2 10*3/uL — ABNORMAL HIGH (ref 1.5–6.5)
NEUT%: 70.4 % (ref 38.4–76.8)
PLATELETS: 314 10*3/uL (ref 145–400)
RBC: 4.39 10*6/uL (ref 3.70–5.45)
RDW: 13.1 % (ref 11.2–14.5)
WBC: 10.3 10*3/uL (ref 3.9–10.3)
lymph#: 2.2 10*3/uL (ref 0.9–3.3)

## 2013-12-24 LAB — COMPREHENSIVE METABOLIC PANEL (CC13)
ALK PHOS: 54 U/L (ref 40–150)
ALT: 26 U/L (ref 0–55)
AST: 19 U/L (ref 5–34)
Albumin: 3.6 g/dL (ref 3.5–5.0)
Anion Gap: 10 mEq/L (ref 3–11)
BILIRUBIN TOTAL: 0.2 mg/dL (ref 0.20–1.20)
BUN: 13.2 mg/dL (ref 7.0–26.0)
CO2: 23 meq/L (ref 22–29)
Calcium: 9.1 mg/dL (ref 8.4–10.4)
Chloride: 107 mEq/L (ref 98–109)
Creatinine: 0.7 mg/dL (ref 0.6–1.1)
EGFR: >90 mL/min/{1.73_m2} (ref >90)
Glucose: 101 mg/dl (ref 70–140)
Potassium: 4.3 mEq/L (ref 3.5–5.1)
SODIUM: 140 meq/L (ref 136–145)
TOTAL PROTEIN: 6.8 g/dL (ref 6.4–8.3)

## 2013-12-24 NOTE — Progress Notes (Signed)
Patient Care Team: Luz Lex, MD as PCP - General (Obstetrics and Gynecology)  DIAGNOSIS: Cancer of upper-outer quadrant, left breast   Staging form: Breast, AJCC 7th Edition     Clinical: Stage 0 (Tis (DCIS), N0, cM0) - Unsigned       Staging comments: Staged at breast conference 9.25.13        Prognostic indicators: ER 99%  PR = 69%      Pathologic: Stage 0 (Tis, N0, cM0) - Signed by Haywood Lasso, MD on 12/14/2011       Prognostic indicators: ER 99%  PR = 69%    SUMMARY OF ONCOLOGIC HISTORY:   Cancer of upper-outer quadrant, left breast   10/06/2011 Procedure genetic counseling and testing found to be negative for BRCA1 and 2 gene mutations   11/24/2011 Surgery left mastectomy: DCIS intermediate grade 5.8 cm 3 SL and negative, ER 99%, PR 69%; right mastectomy: lobular neoplasia atypical lobular hyperplasia   11/24/2011 Surgery immediate reconstruction with tissue expander by Dr. Harlow Mares    CHIEF COMPLIANT: Follow-up of left breast DCIS  INTERVAL HISTORY: Samantha Andersen is a 48 year old lady with above-mentioned history of left sided DCIS treated with mastectomy followed by immediate reconstruction. She also has a history of celiac disease for which she is on gluten-free diet. She was previously known to have iron deficiency and had required blood transfusions before. She is currently on B-12 and vitamin D supplementation. 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  REVIEW OF SYSTEMS:   Constitutional: Denies fevers, chills or abnormal weight loss Eyes: Denies blurriness of vision Ears, nose, mouth, throat, and face: Denies mucositis or sore throat Respiratory: Denies cough, dyspnea or wheezes Cardiovascular: Denies palpitation, chest discomfort or lower extremity  swelling Gastrointestinal:  Denies nausea, heartburn or change in bowel habits Skin: Denies abnormal skin rashes Lymphatics: Denies new lymphadenopathy or easy bruising Neurological:Denies numbness, tingling or new weaknesses Behavioral/Psych: Mood is stable, no new changes  Breast:  denies any pain or lumps or nodules in either breasts All other systems were reviewed with the patient and are negative.  I have reviewed the past medical history, past surgical history, social history and family history with the patient and they are unchanged from previous note.  ALLERGIES:  is allergic to doxycycline; gluten meal; lipitor; other; tetracyclines & related; ciprofloxacin; and metformin and related.  MEDICATIONS:  Current Outpatient Prescriptions  Medication Sig Dispense Refill  . diclofenac sodium (VOLTAREN) 1 % GEL Apply topically 4 (four) times daily as needed.    Marland Kitchen ibuprofen (ADVIL,MOTRIN) 100 MG tablet Take 100 mg by mouth as needed.    . meloxicam (MOBIC) 15 MG tablet Take 15 mg by mouth daily as needed.     . metFORMIN (GLUCOPHAGE-XR) 500 MG 24 hr tablet Take 500 mg by mouth daily with breakfast.    . VITAMIN D, ERGOCALCIFEROL, PO Take 50,000 Units by mouth once a week.      No current facility-administered medications for this visit.    PHYSICAL EXAMINATION: ECOG PERFORMANCE STATUS: 1 - Symptomatic but completely ambulatory  Filed Vitals:   12/24/13 1041  BP: 125/86  Pulse: 88  Temp: 98.6 F (37 C)   Filed Weights   12/24/13 1041  Weight: 175 lb 12.8 oz (79.742 kg)    GENERAL:alert, no distress and  comfortable SKIN: skin color, texture, turgor are normal, no rashes or significant lesions EYES: normal, Conjunctiva are pink and non-injected, sclera clear OROPHARYNX:no exudate, no erythema and lips, buccal mucosa, and tongue normal  NECK: supple, thyroid normal size, non-tender, without nodularity LYMPH:  no palpable lymphadenopathy in the cervical, axillary or  inguinal LUNGS: clear to auscultation and percussion with normal breathing effort HEART: regular rate & rhythm and no murmurs and no lower extremity edema ABDOMEN:abdomen soft, non-tender and normal bowel sounds Musculoskeletal:no cyanosis of digits and no clubbing  NEURO: alert & oriented x 3 with fluent speech, no focal motor/sensory deficits BREAST: No palpable masses or nodules in either right or left breasts. No palpable axillary supraclavicular or infraclavicular adenopathy   LABORATORY DATA:  I have reviewed the data as listed   Chemistry      Component Value Date/Time   NA 140 12/24/2013 1013   NA 138 11/17/2011 1514   K 4.3 12/24/2013 1013   K 4.1 11/17/2011 1514   CL 108* 05/18/2012 1041   CL 100 11/17/2011 1514   CO2 23 12/24/2013 1013   CO2 26 11/17/2011 1514   BUN 13.2 12/24/2013 1013   BUN 14 11/17/2011 1514   CREATININE 0.7 12/24/2013 1013   CREATININE 0.72 11/17/2011 1514      Component Value Date/Time   CALCIUM 9.1 12/24/2013 1013   CALCIUM 9.6 11/17/2011 1514   ALKPHOS 54 12/24/2013 1013   ALKPHOS 48 11/17/2011 1514   AST 19 12/24/2013 1013   AST 22 11/17/2011 1514   ALT 26 12/24/2013 1013   ALT 18 11/17/2011 1514   BILITOT 0.20 12/24/2013 1013   BILITOT 0.3 11/17/2011 1514       Lab Results  Component Value Date   WBC 10.3 12/24/2013   HGB 13.3 12/24/2013   HCT 41.2 12/24/2013   MCV 93.8 12/24/2013   PLT 314 12/24/2013   NEUTROABS 7.2* 12/24/2013    ASSESSMENT & PLAN:  Cancer of upper-outer quadrant, left breast 1 DCIS of the left breast- ER positive PR positive/ atypical lobular hyperplasia of right breast . Patient is now status post bilateral mastectomies with bilateral breast implants.   Surveillance: Today's breast exam is normal.  2. Celiac disease: Patient is very strictly on a gluten-free diet. This is probably why she had iron deficiency. Since she has been a gluten-free diet, she has not been iron deficient. She continues to be on  B-12 and vitamin D supplementation.  3. 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 In fact she has been more of an iron deficient in the past.  Follow-up in 6 months.    Orders Placed This Encounter  Procedures  . CBC with Differential    Standing Status: Future     Number of Occurrences:      Standing Expiration Date: 12/24/2014  . Comprehensive metabolic panel (Cmet) - [facility]    Standing Status: Future     Number of Occurrences:      Standing Expiration Date: 12/24/2014  . Iron and TIBC [facility]    Standing Status: Future     Number of Occurrences:      Standing Expiration Date: 12/24/2014  . Ferritin    Standing Status: Future     Number of Occurrences:      Standing Expiration Date: 12/24/2014  . Folate, Serum    Standing Status: Future     Number of Occurrences:      Standing Expiration Date: 12/24/2014  .  Vitamin B12    Standing Status: Future     Number of Occurrences:      Standing Expiration Date: 12/24/2014   The patient has a good understanding of the overall plan. she agrees with it. She will call with any problems that may develop before her next visit here.   Rulon Eisenmenger, MD 12/24/2013 4:35 PM

## 2013-12-24 NOTE — Telephone Encounter (Signed)
, °

## 2013-12-24 NOTE — Assessment & Plan Note (Signed)
1 DCIS of the left breast- ER positive PR positive/ atypical lobular hyperplasia of right breast . Patient is now status post bilateral mastectomies with bilateral breast implants.   Surveillance: Today's breast exam is normal.  2. Celiac disease: Patient is very strictly on a gluten-free diet. This is probably why she had iron deficiency. Since she has been a gluten-free diet, she has not been iron deficient. She continues to be on B-12 and vitamin D supplementation.  3. 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 In fact she has been more of an iron deficient in the past.  Follow-up in 6 months.

## 2014-01-01 ENCOUNTER — Other Ambulatory Visit: Payer: Self-pay | Admitting: Gastroenterology

## 2014-01-01 DIAGNOSIS — R1310 Dysphagia, unspecified: Secondary | ICD-10-CM

## 2014-01-02 ENCOUNTER — Encounter: Payer: BC Managed Care – PPO | Attending: Family Medicine | Admitting: *Deleted

## 2014-01-02 DIAGNOSIS — E669 Obesity, unspecified: Secondary | ICD-10-CM | POA: Diagnosis not present

## 2014-01-02 DIAGNOSIS — E785 Hyperlipidemia, unspecified: Secondary | ICD-10-CM | POA: Diagnosis not present

## 2014-01-02 DIAGNOSIS — Z713 Dietary counseling and surveillance: Secondary | ICD-10-CM | POA: Insufficient documentation

## 2014-01-02 DIAGNOSIS — E282 Polycystic ovarian syndrome: Secondary | ICD-10-CM

## 2014-01-02 NOTE — Progress Notes (Signed)
Medical Nutrition Therapy:  Appt start time: 1030 end time:  1100.   Assessment:  Primary concerns today:  Patient was referred to our office for obesity and hyperlipidemia.  She met with Sandie Ano, RD, for those complaints.  At that visit, it was revealed she has PCOS and several other medical nutrition needs and she was referred to this RD for those concerns.  Patient denied need for PCOS education; she just wanted me to tell her what to do  HPI: The patient wants to discuss all of her medical conditions and what she should/should not be eating as well as how to lose weight. Patient states: ever since she was bitten by a tick she has been diagnosed with: celiac disease, ankylosing spondilytis, and cancer. She cooks and shops for the household with her husband picking up the responsibility sometimes. She eats at the kitchen table and eats fast. Patient also states she has had trouble swallowing food items especially when they are hot/warm for the past couple of weeks but can swallow thicker liquids/thinner liquids more easily. She states the swallowing problem is the worst when she does not eat for a few hours (3-4 hours) which is a common occurrence for her. She states her swallowing issues may be due to her thyroid which they are testing in the next couple of days. From all of her medical diagnosis she is confused about what to eat, how much to eat, and why she cannot lose weight. She states she is usually around 150lbs and a reaction she had with lisinopril with alcohol created an adverse reaction which lead to her weight gain. She is currently on a gluten free diet due to her celiacs diagnosis. She also has PCOS which she has not seen a dietitian for so this dietitian suggested she see Grafton Folk MS, RD, LDN, CSP for her PCOS which may be another cause of her weight gain/difficulty with weight loss compounding with her other medical diagnoses/medications.    Preferred Learning Style:    Auditory  Learning Readiness:   Change in progress   MEDICATIONS: metformin, [phone] mg/day   DIETARY INTAKE:  Usual eating pattern includes 3 meals and 3 snacks per day.  Everyday foods include eating out.  Avoided foods include wheat.    24-hr recall:  B ( AM): decaf mocha from [employer] coffee, sausage with mushroom and onion, apple  Snk ( AM): chips-fritos L ( PM): Moes barrito bowl-black beans, rice, chicken, vegetables, sour cream---[employer] salads-----salsarittas  Snk ( PM): concord grapes D ( PM): salad with chicken Snk ( PM): occasionally carrots Beverages: water, decaf coffee, herbal tea  Eats out a lot  Usual physical activity: mon,wed, fri-thirty minutes on oliptical and weights-------tuesd, thurs a 5k walk   Estimated energy needs: 2000 calories 225 g carbohydrates 150 g protein 56 g fat  Progress Towards Goal(s):  In progress.   Nutritional Diagnosis:  NB-1.1 Food and nutrition-related knowledge deficit As related to a plethora of new medical diagnoses which can be ameliorated with nutrition.  As evidenced by patient report of being confused about her new medical diagnosis and not knowing how to eat .    Intervention:  Discussed physiology of carbohydrate metabolism and how it is affected by PCOS.  Discussed hormonal imbalances associated with PCOS and how those imbalances present themselves with hirsutism, body acne, menstrual irregularity, obesity, and poor glycemic control.  Dicussed possible increased risk for CVD and the importance of nutrition management for overall health.   Recommended the  Mediterranean style eating plan: MUFAs, whole grains, fruits, vegetables, legumes, lean proteins, and low-fat dairy.  Recommended limiting refined carbohydrates and concentrated sweets.  Suggested regularly scheduled meals and snacks and to avoid meal skipping.  Recommended fiber and lean protein with all meals and to include non-starchy vegetables with most meals.     Recommended regular physical activity of 150 minutes/week.  Discussed reading food labels: focusing on fiber and limited sugars and saturated, trans fats.  Recommended talking with her provider about increasing her metformin dose: Metformin 2000 mg daily for PCOS.  Recommended taking with food already on her stomach (after she finishes a meal).   Recommended starting at low dose and titrating up slowly to minimize GI distress:  Start with 500 mg for 2 weeks, then 1000 mg for 2 weeks, then 1500 mg for 2 weeks until final dose of 2000 is reached   Teaching Method Utilized:  Auditory   Handouts given during visit include:  Low glycemic index shopping list  Barriers to learning/adherence to lifestyle change: several medical conditions requiring different nutritional needs and parameters.  Demonstrated degree of understanding via:  Teach Back   Monitoring/Evaluation:  Dietary intake, exercise, and body weight prn.

## 2014-01-06 ENCOUNTER — Ambulatory Visit
Admission: RE | Admit: 2014-01-06 | Discharge: 2014-01-06 | Disposition: A | Discharge status: Home / Self Care | Payer: BC Managed Care – PPO | Source: Ambulatory Visit | Attending: Gastroenterology | Admitting: Gastroenterology

## 2014-01-06 ENCOUNTER — Telehealth: Payer: Self-pay | Admitting: Genetic Counselor

## 2014-01-06 ENCOUNTER — Encounter: Payer: Self-pay | Admitting: Genetic Counselor

## 2014-01-06 DIAGNOSIS — R1310 Dysphagia, unspecified: Secondary | ICD-10-CM

## 2014-01-06 DIAGNOSIS — Z1379 Encounter for other screening for genetic and chromosomal anomalies: Secondary | ICD-10-CM | POA: Insufficient documentation

## 2014-01-06 NOTE — Telephone Encounter (Signed)
Jihan called and LM on my VM on 01/02/14 asking whether her VUS has been reclassified.  I called [facility] to see if Shalita's BRIP1 VUS had been reclassified since 2013.  At first glance, they did not see that it has been, but will have someone do a bit more research and get back to me.

## 2014-01-06 NOTE — Telephone Encounter (Signed)
Revealed that her VUS has not been reclassified.  Please keep in contact with [place] on an annual basis so we can keep you updated on the changes in genetics.

## 2014-04-04 ENCOUNTER — Ambulatory Visit (INDEPENDENT_AMBULATORY_CARE_PROVIDER_SITE_OTHER): Payer: BLUE CROSS/BLUE SHIELD | Admitting: Cardiology

## 2014-04-04 ENCOUNTER — Encounter: Payer: Self-pay | Admitting: Cardiology

## 2014-04-04 VITALS — BP 110/66 | HR 102 | Ht 63.0 in | Wt 175.8 lb

## 2014-04-04 DIAGNOSIS — R002 Palpitations: Secondary | ICD-10-CM

## 2014-04-04 DIAGNOSIS — E785 Hyperlipidemia, unspecified: Secondary | ICD-10-CM

## 2014-04-04 DIAGNOSIS — R079 Chest pain, unspecified: Secondary | ICD-10-CM

## 2014-04-04 LAB — COMPREHENSIVE METABOLIC PANEL
ALT: 19 U/L (ref 0–35)
AST: 18 U/L (ref 0–37)
Albumin: 4.1 g/dL (ref 3.5–5.2)
Alkaline Phosphatase: 49 U/L (ref 39–117)
BUN: 13 mg/dL (ref 6–23)
CO2: 22 mEq/L (ref 19–32)
Calcium: 9.5 mg/dL (ref 8.4–10.5)
Chloride: 103 mEq/L (ref 96–112)
Creat: 0.65 mg/dL (ref 0.50–1.10)
Glucose, Bld: 104 mg/dL — ABNORMAL HIGH (ref 70–99)
Potassium: 4.2 mEq/L (ref 3.5–5.3)
Sodium: 138 mEq/L (ref 135–145)
Total Bilirubin: 0.3 mg/dL (ref 0.2–1.2)
Total Protein: 6.6 g/dL (ref 6.0–8.3)

## 2014-04-04 LAB — TSH: TSH: 1.868 u[IU]/mL (ref 0.350–4.500)

## 2014-04-04 NOTE — Patient Instructions (Addendum)
Your physician recommends that you continue on your current medications as directed. Please refer to the Current Medication list given to you today.     Your physician recommends that you return for lab work in: TODAY---CMET, TSH, NMR WITH LIPIDS   You have been referred to [facility]   Your physician wants you to follow-up in: [place] will receive a reminder letter in the mail two months in advance. If you don't receive a letter, please call our office to schedule the follow-up appointment.

## 2014-04-04 NOTE — Progress Notes (Signed)
Patient ID: Samantha Andersen, female   DOB: 1965-10-04, 49 y.o.   MRN: [id number] Patient ID: Samantha Andersen, female   DOB: September 26, 1965, 49 y.o.   MRN: [id number]     Patient Name: Samantha Andersen Date of Encounter: 04/04/2014  Primary Care Provider:  Luz Lex, MD Primary Cardiologist:  Dorothy Spark  Problem List   Past Medical History  Diagnosis Date  . PCOS (polycystic ovarian syndrome)   . Anemia   . Hyperlipidemia   . Anxiety   . Hot flashes   . Joint pain   . Breast cancer     left  . Spondyloarthritis   . Complication of anesthesia     tachycardic, also reports that she needed increased anesth. because she has a high threshold for medicine/sedation  . PONV (postoperative nausea and vomiting)     also reports N&V accompanies anxiety  . 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 Celiac disease    Past Surgical History  Procedure Laterality Date  . Abdominal hysterectomy    . Cesarean section      x2  . Simple mastectomy with axillary sentinel node biopsy  11/24/2011    Procedure: SIMPLE MASTECTOMY WITH AXILLARY SENTINEL NODE BIOPSY;  Surgeon: Haywood Lasso, MD;  Location: [facility];  Service: General;  Laterality: Left;  Bilateral Total Mastectomy and Left Sentinel Node  . Simple mastectomy with axillary sentinel node biopsy  11/24/2011    Procedure: SIMPLE MASTECTOMY;  Surgeon: Haywood Lasso, MD;  Location: [facility];  Service: General;  Laterality: Right;  . Tissue expander placement  11/24/2011    Procedure: TISSUE EXPANDER;  Surgeon: Crissie Reese, MD;  Location: [facility];  Service: Plastics;  Laterality: Bilateral;    Allergies  Allergies  Allergen Reactions  . Doxycycline Shortness Of Breath  . Gluten Meal Shortness Of Breath  . Lipitor [Atorvastatin] Other (See Comments)  . Other Shortness Of Breath    SQUASH  . Tetracyclines & Related Shortness Of Breath and Other (See Comments)    Lethargy   . Ciprofloxacin     unknown  . Metformin And Related       Patient prefers to take Tevo brand metformin XR due to sensitivity rection to other brands. Patient is allergic to gluten and must have gluten free Metformin    HPI  49 year old female with new diagnosis of left breast cancers DCIS stage 0 and atypical lobular hyperplasia of the right breast. Patient is status post bilateral mastectomies with immediate reconstruction. She was referred to [place] for concerns of frequent tachycardias with 1 episode observed during diagnostic colonoscopy. The patient has history of polycystic ovary syndrome with chronic persistent bleeding over 26 years until she underwent hysterectomy 2 years ago. She still has preserved ovaries. She was also diagnosed with anemia as a consequence of chronic bleeding for which she takes iron pills. She was also diagnosed with vitamin D deficiency and most recently with celiac disease and was started on gluten-free diet just 3 weeks ago. Family history includes myocardial infarction in grandfather at age of 34. Patient describes several different type of chest pain some of them associated with palpitations, described as left-sided, sharp, and sometimes associated with dizziness. Those happen frequently and usually have sudden start. The patient is fairly active she walks 5K 5 times a week and usually doesn't have physical limitation in regards to shortness of breath, dizziness, chest pain or syncope. Patient records states that she has history of hyperlipidemia we can know the  actual number of her primary care physician chest lipid profile. We will try to obtain.  The patient is coming after one month, she reports that she continues having palpitations that last seconds to minutes and sometimes are associated with dizziness. However she hasn't had any in the last couple of weeks and didn't have any during her Holter monitoring. Once the, they last for few days. She also mentions that when she had MRI of her brain there were abnormal lesions and  wonders if those might be related to mini strokes.  The patient, after one year, she states that her optic patient has been came less frequent, she was diagnosed with gluten enteropathy and has been on the gluten-free diet that improved her symptoms. 48 hour Holter monitor last year was completely normal. She hasn't been exercises as much as she was last year. In the last year visit recheck her cholesterol and her LDL cholesterol was 194 and she was started on atorvastatin 10 mg daily that gave her very significant side effects with weight gain, generalized muscle and joint pain. She denies any chest pain, she is also complaining of upper extremity especially hand swelling while she is walking. She was also diagnosed with ankylosing spondylitis and is asking if that has any effect on her heart.    Home Medications  Prior to Admission medications   Medication Sig Start Date End Date Taking? Authorizing Provider  diclofenac (VOLTAREN) 0.1 % ophthalmic solution 4 (four) times daily.   Yes Historical Provider, MD  ibuprofen (ADVIL,MOTRIN) 100 MG tablet Take 100 mg by mouth as needed.   Yes Historical Provider, MD  meloxicam (MOBIC) 15 MG tablet Take 15 mg by mouth daily.  02/08/13  Yes Historical Provider, MD  metFORMIN (GLUCOPHAGE-XR) 500 MG 24 hr tablet Take 500 mg by mouth daily with breakfast.   Yes Historical Provider, MD  OVER THE COUNTER MEDICATION 1,200 mg. CATAPLEX   Yes Historical Provider, MD  VITAMIN D, ERGOCALCIFEROL, PO Take by mouth daily.   Yes Historical Provider, MD   Family History  Family History  Problem Relation Age of Onset  . Lung cancer Maternal Grandfather   . Hemochromatosis Sister   . Prostate cancer Paternal Uncle 30  . Cancer Paternal Uncle 63    unknown type of cancer    Social History  History   Social History  . Marital Status: Married    Spouse Name: N/A  . Number of Children: N/A  . Years of Education: N/A   Occupational History  . Not on file.    Social History Main Topics  . Smoking status: Former Smoker -- 2.00 packs/day for 4 years    Quit date: 10/04/1985  . Smokeless tobacco: Not on file  . Alcohol Use: Yes     Comment: occ.  . Drug Use: No  . Sexual Activity: Not on file   Other Topics Concern  . Not on file   Social History Narrative     Review of Systems, as per HPI, otherwise negative General:  No chills, fever, night sweats or weight changes.  Cardiovascular:  No chest pain, dyspnea on exertion, edema, orthopnea, palpitations, paroxysmal nocturnal dyspnea. Dermatological: No rash, lesions/masses Respiratory: No cough, dyspnea Urologic: No hematuria, dysuria Abdominal:   No nausea, vomiting, diarrhea, bright red blood per rectum, melena, or hematemesis Neurologic:  No visual changes, wkns, changes in mental status. All other systems reviewed and are otherwise negative except as noted above.  Physical Exam  Blood pressure 110/66,  pulse 102, height 5\' 3"  (1.6 m), weight 175 lb 12.8 oz (79.742 kg).  General: Pleasant, NAD Psych: Normal affect. Neuro: Alert and oriented X 3. Moves all extremities spontaneously. HEENT: Normal  Neck: Supple without bruits or JVD. Lungs:  Resp regular and unlabored, CTA. Heart: RRR no s3, s4, or murmurs. Abdomen: Soft, non-tender, non-distended, BS + x 4.  Extremities: No clubbing, cyanosis or edema. DP/PT/Radials 2+ and equal bilaterally.  Labs:  No results for input(s): CKTOTAL, CKMB, TROPONINI in the last 72 hours. Lab Results  Component Value Date   WBC 10.3 12/24/2013   HGB 13.3 12/24/2013   HCT 41.2 12/24/2013   MCV 93.8 12/24/2013   PLT 314 12/24/2013   Accessory Clinical Findings  Echocardiogram - Study Conclusions  - HPI and indications: Resting chest pain. - Stress ECG conclusions: The stress ECG was normal. - Baseline: LV global systolic function was normal. The estimated LV ejection fraction was 60%. Normal wall motion; no LV regional wall motion  abnormalities. - Peak stress: LV global systolic function was vigorous. Normal wall motion; no LV regional wall motion abnormalities. - Impressions: There is normal increase in thickening and contractility in all segments. This is a normal stress echo. Impressions:  - There is normal increase in thickening and contractility in all segments. This is a normal stress echo.  ECG - normal sinus rhythm, 80 beats per minute, normal EKG.     Assessment & Plan  Number pleasant 49 year old female with prior medical history of breast cancer in remission, vitamin D deficiency, anemia, PCOS and known insulin-dependent diabetes mellitus who is coming for concerns of tachycardia and atypical chest pain.  1. Palpitations - proved with gluten-free diet. No trips needed at this point A 48 hour Holter to evaluate for possible arrhythmias and heart rate was completely normal there were no arrhythmias. Her average heart rate was normal. Since her episodes of palpitations come in clusters we will order another 48-hour Holter monitor and she will call in once her episodes start. I reviewed her MRI of brain and it states that there are foci of abnormal white matter signal within the cerebral hemispheres. The primary differential diagnosis is multiple sclerosis versus migraine related foci. That would make concern for atrial fibrillation and ischemic stroke less probable.  Her stress echocardiogram was completely normal with normal heart anatomy and no concern for ischemia.  2. Hyperlipidemia - possible genetic component as her LDL is 194 and she is on a great diet and exercise program. We will recheck today and and refer her to Elberta Leatherwood to the lipid clinic as she her LDL is significantly elevated and she is intolerant to statins.  Followup in 1 year  Dorothy Spark, MD, [facility] 04/04/2014, 9:35 AM

## 2014-04-07 LAB — NMR LIPOPROFILE WITH LIPIDS
Cholesterol, Total: 233 mg/dL — ABNORMAL HIGH (ref 100–199)
HDL Particle Number: 36.5 umol/L (ref >=30.5)
HDL Size: 9.2 nm (ref >=9.2)
HDL-C: 64 mg/dL (ref >39)
LDL (calc): 145 mg/dL — ABNORMAL HIGH (ref 0–99)
LDL Particle Number: 1748 nmol/L — ABNORMAL HIGH (ref <1000)
LDL Size: 21.2 nm (ref >=20.8)
LP-IR Score: <25 (ref <=45)
Large HDL-P: 8.3 umol/L (ref >=4.8)
Large VLDL-P: <0.8 nmol/L (ref <=2.7)
Small LDL Particle Number: 617 nmol/L — ABNORMAL HIGH (ref <=527)
Triglycerides: 118 mg/dL (ref 0–149)
VLDL Size: 38.5 nm (ref <=46.6)

## 2014-04-11 ENCOUNTER — Ambulatory Visit (INDEPENDENT_AMBULATORY_CARE_PROVIDER_SITE_OTHER): Payer: BLUE CROSS/BLUE SHIELD | Admitting: Pharmacist

## 2014-04-11 DIAGNOSIS — E785 Hyperlipidemia, unspecified: Secondary | ICD-10-CM | POA: Diagnosis not present

## 2014-04-11 NOTE — Progress Notes (Signed)
HPI  Samantha Andersen is a 49 yo F pt of Dr. Meda Coffee who was referred to the clinic for hyperlipidemia and a history of statin intolerance.  She has a history of breast cancer s/p double mastectomy and reconstructive surgery, PCOS, Celiac disease, palpitations, and vitamin D deficiency.  She does not have any history of CAD or HTN.  She does not smoke and only drinks alcohol ~1 per month.  Family history significant for her grandfather who had an MI at 67 and grandmother stroke at 34.  She states her sister is on cholesterol medications but has never had an event.  The only cholesterol medication she has tried was Lipitor.  She states it made her weak, lethargic, gain weight and she had abnormal labs (unsure which labs).    Diet Pt has been on a gluten free diet for over a year now.  She likes fresh fruit but does go out to eat a lot at places such as [employer], [employer], and [employer].  Her weakness is potato chips.  She has seen a nutritionalist on several occassions but states it was not very helpful.    Exercise Pt walks a 5K with her husband 4-5 days a week.   RF: none Goals: LDL 160mg /dL, non-HDL <190 mg/dL Current Meds: none Intolerances: Lipitor   Labs 03/2014- TC- 233, TG 118, HDL- 64, LDL-145, LDL-P- 1748, LFTs normal (no meds) 03/2013- TC- 267, TG 84, HDL 56, LDL-194 (no meds)  Current Outpatient Prescriptions  Medication Sig Dispense Refill  . diclofenac sodium (VOLTAREN) 1 % GEL Apply topically 4 (four) times daily as needed.    Marland Kitchen ibuprofen (ADVIL,MOTRIN) 100 MG tablet Take 100 mg by mouth as needed.    . metFORMIN (GLUCOPHAGE-XR) 500 MG 24 hr tablet Take 500 mg by mouth 2 (two) times daily.     Marland Kitchen VITAMIN D, ERGOCALCIFEROL, PO Take 5,000 Units by mouth daily.      No current facility-administered medications for this visit.   Allergies  Allergen Reactions  . Doxycycline Shortness Of Breath  . Gluten Meal Shortness Of Breath  . Lipitor [Atorvastatin] Other (See Comments)  . Other  Shortness Of Breath    SQUASH  . Tetracyclines & Related Shortness Of Breath and Other (See Comments)    Lethargy   . Ciprofloxacin     unknown  . Metformin And Related     Patient prefers to take Tevo brand metformin XR due to sensitivity rection to other brands. Patient is allergic to gluten and must have gluten free Metformin

## 2014-04-11 NOTE — Assessment & Plan Note (Signed)
Pt's LDL has decreased significantly in the past year with just lifestyle change alone.  The biggest difference is with her diet and changing to gluten free.  This was the first lipoprofile available for patient.  Her LDL-P was elevated as well as an increase in small LDL particles.  We discussed the atherogenic properties of her LDL and how based on guidelines we don't necessarily have to start a medication but her LDL is not necessarily the best type of LDL. A statin would be the preferred therapy in this case.  Given her symptoms with Lipitor, I do not think there is a reason she couldn't retry another.  Would prefer pravastatin or Crestor.  Pt is concerned about gluten in the tablets.  Will send a medical inquiry to find out if Crestor has gluten or not.  If not, will start her on 5mg  once daily and recheck labs in 3 months.

## 2014-04-15 ENCOUNTER — Telehealth: Payer: Self-pay | Admitting: *Deleted

## 2014-04-15 NOTE — Telephone Encounter (Signed)
Patient called in reference to finding out if her statin medication is gluten free or not. She states that she thought she would have a response by today so she can start the medication soon.  Instructed the patient that Gay Filler, pharmD was not in and will return on Friday.  I spoke with Erasmo Downer- the pharmacist on duty and she was unable to call the patient back at this or in the few days and asked if I would tell her that Gay Filler has notified the drug company to find out if the med is gluten free and tell her that she should expect a response back on Friday by Gay Filler, pharmD.  Instructed patient that I would communicate this to Gay Filler and ensure that she calls her.

## 2014-04-18 MED ORDER — ROSUVASTATIN CALCIUM 5 MG PO TABS: 5.0000 mg | ORAL_TABLET | Freq: Every day | ORAL | Status: DC

## 2014-04-18 NOTE — Addendum Note (Signed)
Addended by: Elberta Leatherwood R on: 04/18/2014 01:32 PM   Modules accepted: Orders

## 2014-04-18 NOTE — Telephone Encounter (Signed)
Spoke with pt. I did get a response for [employer] that Crestor is gluten-free so she will be able to take it from that standpoint.  Will send in Rx to [facility].  Have asked her to try to take it every day but if she wants to try it 3 days of the week at first to ensure tolerability that will be okay as well.  She will let me know how she is doing in the next few weeks and if she is tolerating then we will set up repeat labwork.

## 2014-06-13 ENCOUNTER — Telehealth: Payer: Self-pay | Admitting: Cardiology

## 2014-06-13 NOTE — Telephone Encounter (Signed)
Pt calling to inform Dr Meda Coffee and Elberta Leatherwood Pharm D in lipid clinic that she is not able to tolerate her statin Crestor anymore.  Pt states that she saw her rheumatologist the other day and she recommended the pt call her cardiologist office to inform that her Crestor is causing the pt to have multiple complaints like : muscle aches, lower back pain, muscle fatigue.  Pt states she bought a fit bit as well and uses this during exercise daily.  Pt states she runs/walks a 5 K daily and she has noticed her HR increase all the way up to 140-180, when it used to just go up to 120s. Pt states she's asymptomatic with no cp or sob when she notates her elevated HR while exerting. Pt correlates all this with her use of statins.  Pt also states that her Rheumatologist did a CPK on her the other day and her numbers read high.  Pt unable to provide exact values.  Pt states her Rheumatologist correlated this with statin use.  Pt would like for Dr Meda Coffee and Elberta Leatherwood PharmD to advise on a different regimen if needed.  Pt would like for Dr Meda Coffee to advise on elevated HR issues.  Pt states she did not take her statin yesterday and feels much better.  Informed the pt that I will speak with Elberta Leatherwood PharmD and route this message to Dr Meda Coffee for further review and recommendation.  Pt verbalized understanding and agrees with this plan.

## 2014-06-13 NOTE — Telephone Encounter (Signed)
New Message  Pt c/o of reaction to statin, as before. Pt did not take statin last night and would like an alternative. Pt c/o of pale stool, Lower back weakness, and muscle fatigue. Per pt's FitBit: pulse was 187 for ten minutes on 2 separate days, 140s for ten minutes. During morning walk normally 120s. Please call back and discuss.

## 2014-06-13 NOTE — Telephone Encounter (Signed)
Informed the pt that I spoke with Samantha Andersen PharmD in lipid clinic and per Samantha Andersen the pt should stop taking her Crestor and remain off of this medication.  Informed the pt that per Samantha Andersen PharmD she will not start the pt on any other statin regimen or any regimen to address her LDL, for its safe for her to be off this because she has no known CAD.  Informed the pt that per Samantha Andersen her complaints should subside within the next couple of days with discontinuation of statin.  Informed the pt that I will also still route this message for Dr Meda Coffee to review and further advise if necessary and follow-up with her thereafter if needed.  Pt verbalized understanding and agrees with this plan.  Will update Crestor intolerance in pts allergies.

## 2014-07-07 ENCOUNTER — Other Ambulatory Visit: Payer: Self-pay | Admitting: Obstetrics and Gynecology

## 2014-07-08 ENCOUNTER — Other Ambulatory Visit: Payer: Self-pay | Admitting: Obstetrics and Gynecology

## 2014-07-08 DIAGNOSIS — N63 Unspecified lump in unspecified breast: Secondary | ICD-10-CM

## 2014-07-08 DIAGNOSIS — Z9013 Acquired absence of bilateral breasts and nipples: Secondary | ICD-10-CM

## 2014-07-08 LAB — CYTOLOGY - PAP

## 2014-07-10 ENCOUNTER — Ambulatory Visit
Admission: RE | Admit: 2014-07-10 | Discharge: 2014-07-10 | Disposition: A | Discharge status: Home / Self Care | Payer: BLUE CROSS/BLUE SHIELD | Source: Ambulatory Visit | Attending: Obstetrics and Gynecology | Admitting: Obstetrics and Gynecology

## 2014-07-10 DIAGNOSIS — N63 Unspecified lump in unspecified breast: Secondary | ICD-10-CM

## 2014-07-10 DIAGNOSIS — Z9013 Acquired absence of bilateral breasts and nipples: Secondary | ICD-10-CM

## 2014-07-21 ENCOUNTER — Other Ambulatory Visit: Payer: Self-pay

## 2014-07-25 ENCOUNTER — Ambulatory Visit: Payer: Self-pay | Admitting: Pharmacist

## 2014-07-28 ENCOUNTER — Other Ambulatory Visit (INDEPENDENT_AMBULATORY_CARE_PROVIDER_SITE_OTHER): Payer: BLUE CROSS/BLUE SHIELD | Admitting: *Deleted

## 2014-07-28 DIAGNOSIS — E785 Hyperlipidemia, unspecified: Secondary | ICD-10-CM

## 2014-07-28 LAB — LIPID PANEL
CHOLESTEROL: 210 mg/dL — AB (ref 0–200)
HDL: 52.1 mg/dL (ref >39.00)
LDL Cholesterol: 133 mg/dL — ABNORMAL HIGH (ref 0–99)
NonHDL: 157.9
TRIGLYCERIDES: 123 mg/dL (ref 0.0–149.0)
Total CHOL/HDL Ratio: 4
VLDL: 24.6 mg/dL (ref 0.0–40.0)

## 2014-07-28 LAB — HEPATIC FUNCTION PANEL
ALT: 17 U/L (ref 0–35)
AST: 16 U/L (ref 0–37)
Albumin: 3.8 g/dL (ref 3.5–5.2)
Alkaline Phosphatase: 45 U/L (ref 39–117)
BILIRUBIN TOTAL: 0.3 mg/dL (ref 0.2–1.2)
Bilirubin, Direct: 0.1 mg/dL (ref 0.0–0.3)
Total Protein: 6.7 g/dL (ref 6.0–8.3)

## 2014-07-28 NOTE — Addendum Note (Signed)
Addended by: Eulis Foster on: 07/28/2014 08:44 AM   Modules accepted: Orders

## 2014-07-28 NOTE — Addendum Note (Signed)
Addended by: Eulis Foster on: 07/28/2014 08:43 AM   Modules accepted: Orders

## 2014-07-31 ENCOUNTER — Ambulatory Visit (INDEPENDENT_AMBULATORY_CARE_PROVIDER_SITE_OTHER): Payer: BLUE CROSS/BLUE SHIELD | Admitting: Pharmacist

## 2014-07-31 DIAGNOSIS — E785 Hyperlipidemia, unspecified: Secondary | ICD-10-CM | POA: Diagnosis not present

## 2014-07-31 MED ORDER — EZETIMIBE 10 MG PO TABS: 10.0000 mg | ORAL_TABLET | Freq: Every day | ORAL | Status: DC

## 2014-07-31 NOTE — Patient Instructions (Signed)
Please pick up your prescription for Zetia. Give [place] a call if you find that you cannot tolerate it at [phone] Come back for blood work in 3 months on Monday, September 12 (fasting - lab opens at 7:30) See [place] in lipid clinic on Wednesday, September 14 at 8:30am

## 2014-07-31 NOTE — Progress Notes (Signed)
HPI Mrs. Battaglia is a 49 yo F pt of Dr. Meda Coffee who was referred to the clinic for hyperlipidemia and a history of statin intolerance. She has a history of breast cancer s/p double mastectomy and reconstructive surgery, PCOS, Celiac disease, palpitations, and vitamin D deficiency. She does not have any history of CAD or HTN. She does not smoke and only drinks alcohol ~1 per month. Family history significant for her grandfather who had an MI at 50 and grandmother stroke at 54. She states her sister is on cholesterol medications but has never had an event. Patient was seen 3 months ago in lipid clinic and started on Crestor 5mg  daily. However, patient states that she could not tolerate Crestor even a few times a week due to muscle aches and pains. She has also tried Lipitor in the past and stated that it made her weak, lethargic, gain weight and she had abnormal labs (unsure which labs).   Diet Pt has been on a gluten free diet for over a year now. She likes fresh fruit but does go out to eat a lot at places such as [employer], [employer], and [employer]. Her weakness is potato chips. She has seen a nutritionalist on several occassions but states it was not very helpful.   Exercise Pt walks a 5K with her husband 4-5 days a week and tracks her daily steps on her FitBit.  RF: elevated LDL particle number Goals: LDL 130mg /dL, non-HDL <160 mg/dL Current Meds: none Intolerances: Lipitor, Crestor 5mg  TIW  Labs 07/2014 - TC 210, TG 123, HDL 52, LDL 133 (no meds) 03/2014 lipoprofile - LDL particle number 1748, LDL 145 03/2014 - TC- 233, TG 118, HDL- 64, LDL-145, LDL-P- 1748, LFTs normal (no meds) 03/2013 - TC- 267, TG 84, HDL 56, LDL-194 (no meds)  Current Outpatient Prescriptions on File Prior to Visit  Medication Sig Dispense Refill  . diclofenac sodium (VOLTAREN) 1 % GEL Apply topically 4 (four) times daily as needed.    Marland Kitchen ibuprofen (ADVIL,MOTRIN) 100 MG tablet Take 100 mg by mouth as needed.    .  metFORMIN (GLUCOPHAGE-XR) 500 MG 24 hr tablet Take 500 mg by mouth 2 (two) times daily.     Marland Kitchen VITAMIN D, ERGOCALCIFEROL, PO Take 5,000 Units by mouth daily.      No current facility-administered medications on file prior to visit.   Allergies  Allergen Reactions  . Doxycycline Shortness Of Breath  . Gluten Meal Shortness Of Breath  . Lipitor [Atorvastatin] Other (See Comments)  . Other Shortness Of Breath    SQUASH  . Tetracyclines & Related Shortness Of Breath and Other (See Comments)    Lethargy   . Ciprofloxacin     unknown  . Crestor [Rosuvastatin] Other (See Comments)    Muscle aches and muscle fatigue  . Metformin And Related     Patient prefers to take Tevo brand metformin XR due to sensitivity rection to other brands. Patient is allergic to gluten and must have gluten free Metformin   A/P: Hyperlipidemia: Patient's LDL has continued to decrease with recent lifestyle changes (switching to a gluten free diet and walking more). She is hesitant to try another statin given her intolerances to Lipitor and low dose Crestor. Discussed that statins would be for primary prevention as her main risk factor is her elevated LDL-P and increase in small LDL particles. Patient willing to try Zetia 10mg  daily, will recheck labs and f/u with patient in lipid clinic in 3 months.   Jinny Blossom  Vivien Rota, PharmD [facility] [address] [place], [place] [address] Phone: [phone]; Fax: [phone] 07/31/2014 9:38 AM

## 2014-09-22 ENCOUNTER — Other Ambulatory Visit: Payer: Self-pay

## 2014-09-24 ENCOUNTER — Encounter: Payer: Self-pay | Admitting: Cardiology

## 2014-09-24 ENCOUNTER — Ambulatory Visit (INDEPENDENT_AMBULATORY_CARE_PROVIDER_SITE_OTHER): Payer: BLUE CROSS/BLUE SHIELD | Admitting: Cardiology

## 2014-09-24 ENCOUNTER — Ambulatory Visit: Payer: Self-pay | Admitting: Pharmacist

## 2014-09-24 VITALS — BP 112/84 | HR 87 | Ht 62.0 in | Wt 172.8 lb

## 2014-09-24 DIAGNOSIS — I471 Supraventricular tachycardia: Secondary | ICD-10-CM | POA: Diagnosis not present

## 2014-09-24 DIAGNOSIS — R000 Tachycardia, unspecified: Secondary | ICD-10-CM

## 2014-09-24 DIAGNOSIS — E785 Hyperlipidemia, unspecified: Secondary | ICD-10-CM | POA: Diagnosis not present

## 2014-09-24 DIAGNOSIS — R002 Palpitations: Secondary | ICD-10-CM | POA: Insufficient documentation

## 2014-09-24 DIAGNOSIS — R42 Dizziness and giddiness: Secondary | ICD-10-CM

## 2014-09-24 NOTE — Patient Instructions (Signed)
Medication Instructions:   Your physician recommends that you continue on your current medications as directed. Please refer to the Current Medication list given to you today.     Testing/Procedures:  Your physician has recommended that you wear a 48 HOUR holter monitor. Holter monitors are medical devices that record the heart's electrical activity. Doctors most often use these monitors to diagnose arrhythmias. Arrhythmias are problems with the speed or rhythm of the heartbeat. The monitor is a small, portable device. You can wear one while you do your normal daily activities. This is usually used to diagnose what is causing palpitations/syncope (passing out).  PLEASE SCHEDULE THIS FOR THE PATIENT TO HAVE THIS ON ONLY ON THE DAYS OF 9/21 OR 9/22 PER DR NELSON    Follow-Up:   AT DR NELSON'S NEXT AVAILABLE

## 2014-09-24 NOTE — Progress Notes (Signed)
Patient ID: Samantha Andersen, female   DOB: [birth year], 49 y.o.   MRN: [id number]    Patient Name: Samantha Andersen Date of Encounter: 09/24/2014  Primary Care Provider:  Luz Lex, MD Primary Cardiologist:  Dorothy Spark  Problem List   Past Medical History  Diagnosis Date  . PCOS (polycystic ovarian syndrome)   . Anemia   . Hyperlipidemia   . Anxiety   . Hot flashes   . Joint pain   . Breast cancer     left  . Spondyloarthritis   . Complication of anesthesia     tachycardic, also reports that she needed increased anesth. because she has a high threshold for medicine/sedation  . PONV (postoperative nausea and vomiting)     also reports N&V accompanies anxiety  . 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 Celiac disease    Past Surgical History  Procedure Laterality Date  . Abdominal hysterectomy    . Cesarean section      x2  . Simple mastectomy with axillary sentinel node biopsy  11/24/2011    Procedure: SIMPLE MASTECTOMY WITH AXILLARY SENTINEL NODE BIOPSY;  Surgeon: Haywood Lasso, MD;  Location: [facility];  Service: General;  Laterality: Left;  Bilateral Total Mastectomy and Left Sentinel Node  . Simple mastectomy with axillary sentinel node biopsy  11/24/2011    Procedure: SIMPLE MASTECTOMY;  Surgeon: Haywood Lasso, MD;  Location: [facility];  Service: General;  Laterality: Right;  . Tissue expander placement  11/24/2011    Procedure: TISSUE EXPANDER;  Surgeon: Crissie Reese, MD;  Location: [facility];  Service: Plastics;  Laterality: Bilateral;    Allergies  Allergies  Allergen Reactions  . Doxycycline Shortness Of Breath  . Gluten Meal Shortness Of Breath  . Lipitor [Atorvastatin] Other (See Comments)  . Other Shortness Of Breath    SQUASH  . Tetracyclines & Related Shortness Of Breath and Other (See Comments)    Lethargy   . Ciprofloxacin     unknown  . Crestor [Rosuvastatin] Other (See Comments)    Muscle aches and muscle fatigue  . Metformin And Related       Patient prefers to take Tevo brand metformin XR due to sensitivity rection to other brands. Patient is allergic to gluten and must have gluten free Metformin    HPI  49 year old female with new diagnosis of left breast cancers DCIS stage 0 and atypical lobular hyperplasia of the right breast. Patient is status post bilateral mastectomies with immediate reconstruction. She was referred to [place] for concerns of frequent tachycardias with 1 episode observed during diagnostic colonoscopy. The patient has history of polycystic ovary syndrome with chronic persistent bleeding over 26 years until she underwent hysterectomy 2 years ago. She still has preserved ovaries. She was also diagnosed with anemia as a consequence of chronic bleeding for which she takes iron pills. She was also diagnosed with vitamin D deficiency and most recently with celiac disease and was started on gluten-free diet just 3 weeks ago. Family history includes myocardial infarction in grandfather at age of 44. Patient describes several different type of chest pain some of them associated with palpitations, described as left-sided, sharp, and sometimes associated with dizziness. Those happen frequently and usually have sudden start. The patient is fairly active she walks 5K 5 times a week and usually doesn't have physical limitation in regards to shortness of breath, dizziness, chest pain or syncope. Patient records states that she has history of hyperlipidemia we can know the actual number  of her primary care physician chest lipid profile. We will try to obtain.  The patient is coming after one month, she reports that she continues having palpitations that last seconds to minutes and sometimes are associated with dizziness. However she hasn't had any in the last couple of weeks and didn't have any during her Holter monitoring. Once the, they last for few days. She also mentions that when she had MRI of her brain there were abnormal lesions and  wonders if those might be related to mini strokes.  The patient, after one year, she states that her optic patient has been came less frequent, she was diagnosed with gluten enteropathy and has been on the gluten-free diet that improved her symptoms. 48 hour Holter monitor last year was completely normal. She hasn't been exercises as much as she was last year. In the last year visit recheck her cholesterol and her LDL cholesterol was 194 and she was started on atorvastatin 10 mg daily that gave her very significant side effects with weight gain, generalized muscle and joint pain. She denies any chest pain, she is also complaining of upper extremity especially hand swelling while she is walking. She was also diagnosed with ankylosing spondylitis and is asking if that has any effect on her heart.  09/26/2014 - the patient is coming after 6 months, she was doing well for a while but started to have palpitations with increased HR up to 180 every 6-7 days always at 7=8 am lasting for about 20-30 minutes associated with some dizziness. No chest pain.    Home Medications  Prior to Admission medications   Medication Sig Start Date End Date Taking? Authorizing Provider  diclofenac (VOLTAREN) 0.1 % ophthalmic solution 4 (four) times daily.   Yes Historical Provider, MD  ibuprofen (ADVIL,MOTRIN) 100 MG tablet Take 100 mg by mouth as needed.   Yes Historical Provider, MD  meloxicam (MOBIC) 15 MG tablet Take 15 mg by mouth daily.  02/08/13  Yes Historical Provider, MD  metFORMIN (GLUCOPHAGE-XR) 500 MG 24 hr tablet Take 500 mg by mouth daily with breakfast.   Yes Historical Provider, MD  OVER THE COUNTER MEDICATION 1,200 mg. CATAPLEX   Yes Historical Provider, MD  VITAMIN D, ERGOCALCIFEROL, PO Take by mouth daily.   Yes Historical Provider, MD   Family History  Family History  Problem Relation Age of Onset  . Lung cancer Maternal Grandfather   . Hemochromatosis Sister   . Prostate cancer Paternal Uncle 55    . Cancer Paternal Uncle 42    unknown type of cancer    Social History  Social History   Social History  . Marital Status: Married    Spouse Name: N/A  . Number of Children: N/A  . Years of Education: N/A   Occupational History  . Not on file.   Social History Main Topics  . Smoking status: Former Smoker -- 2.00 packs/day for 4 years    Quit date: 10/04/1985  . Smokeless tobacco: Not on file  . Alcohol Use: Yes     Comment: occ.  . Drug Use: No  . Sexual Activity: Not on file   Other Topics Concern  . Not on file   Social History Narrative     Review of Systems, as per HPI, otherwise negative General:  No chills, fever, night sweats or weight changes.  Cardiovascular:  No chest pain, dyspnea on exertion, edema, orthopnea, palpitations, paroxysmal nocturnal dyspnea. Dermatological: No rash, lesions/masses Respiratory: No cough, dyspnea Urologic:  No hematuria, dysuria Abdominal:   No nausea, vomiting, diarrhea, bright red blood per rectum, melena, or hematemesis Neurologic:  No visual changes, wkns, changes in mental status. All other systems reviewed and are otherwise negative except as noted above.  Physical Exam  Blood pressure 112/84, pulse 87, height 5\' 2"  (1.575 m), weight 172 lb 12.8 oz (78.382 kg).  General: Pleasant, NAD Psych: Normal affect. Neuro: Alert and oriented X 3. Moves all extremities spontaneously. HEENT: Normal  Neck: Supple without bruits or JVD. Lungs:  Resp regular and unlabored, CTA. Heart: RRR no s3, s4, or murmurs. Abdomen: Soft, non-tender, non-distended, BS + x 4.  Extremities: No clubbing, cyanosis or edema. DP/PT/Radials 2+ and equal bilaterally.  Labs:  No results for input(s): CKTOTAL, CKMB, TROPONINI in the last 72 hours. Lab Results  Component Value Date   WBC 10.3 12/24/2013   HGB 13.3 12/24/2013   HCT 41.2 12/24/2013   MCV 93.8 12/24/2013   PLT 314 12/24/2013   Accessory Clinical Findings  Echocardiogram - Study  Conclusions  - HPI and indications: Resting chest pain. - Stress ECG conclusions: The stress ECG was normal. - Baseline: LV global systolic function was normal. The estimated LV ejection fraction was 60%. Normal wall motion; no LV regional wall motion abnormalities. - Peak stress: LV global systolic function was vigorous. Normal wall motion; no LV regional wall motion abnormalities. - Impressions: There is normal increase in thickening and contractility in all segments. This is a normal stress echo. Impressions:  - There is normal increase in thickening and contractility in all segments. This is a normal stress echo.  ECG - normal sinus rhythm, 80 beats per minute, normal EKG.     Assessment & Plan  Number pleasant 49 year old female with prior medical history of breast cancer in remission, vitamin D deficiency, anemia, PCOS and known insulin-dependent diabetes mellitus who is coming for concerns of tachycardia and atypical chest pain.  1. Palpitations - improved with gluten-free diet.Now worsening again. We will start 48 hour holter monitor to evaluate for rthythm during tachycardic episodes. No clear exacerbating factors.   A year ago a 48 hour Holter to was completely normal there were no arrhythmias. Her average heart rate was normal. Since her episodes of palpitations come in clusters we will order another 48-hour Holter monitor and she will call in once her episodes start. I reviewed her MRI of brain and it states that there are foci of abnormal white matter signal within the cerebral hemispheres. The primary differential diagnosis is multiple sclerosis versus migraine related foci. That would make concern for atrial fibrillation and ischemic stroke less probable.  Her stress echocardiogram was completely normal with normal heart anatomy and no concern for ischemia.  2. Hyperlipidemia - possible genetic component as her LDL is 194 and she is on a great diet and exercise  program. Seen in the lipid clinic, didn't tolerate statins, stopped using them.  Followup in 6 weeks.  Dorothy Spark, MD, [facility] 09/24/2014, 4:18 PM

## 2014-10-01 ENCOUNTER — Ambulatory Visit (INDEPENDENT_AMBULATORY_CARE_PROVIDER_SITE_OTHER): Payer: BLUE CROSS/BLUE SHIELD

## 2014-10-01 DIAGNOSIS — R000 Tachycardia, unspecified: Secondary | ICD-10-CM

## 2014-10-01 DIAGNOSIS — I471 Supraventricular tachycardia: Secondary | ICD-10-CM

## 2014-10-01 DIAGNOSIS — R002 Palpitations: Secondary | ICD-10-CM | POA: Diagnosis not present

## 2014-10-07 ENCOUNTER — Telehealth: Payer: Self-pay | Admitting: *Deleted

## 2014-10-07 MED ORDER — CARVEDILOL 3.125 MG PO TABS: 3.1250 mg | ORAL_TABLET | Freq: Two times a day (BID) | ORAL | Status: DC

## 2014-10-07 NOTE — Telephone Encounter (Signed)
-----   Message from Dorothy Spark, MD sent at 10/06/2014  4:49 PM EDT -----  I would recommend to start low dose carvedilol 3.125 mg po BID.  ----- Message -----    From: Dorothy Spark, MD    Sent: 10/06/2014   4:49 PM      To: Dorothy Spark, MD

## 2014-10-07 NOTE — Telephone Encounter (Signed)
   Notified the pt that per Dr Meda Coffee her 48 hour holter monitor showed that she has Inappropriate sinus tachycardia, and has no other arrhythmias present.  Informed the pt that per Dr Meda Coffee she recommends the pt start taking low dose carvedilol 3.125 mg po BID.   Confirmed the pharmacy of choice with the pt.   Pt verbalized understanding, and agrees with this plan.

## 2014-10-10 ENCOUNTER — Ambulatory Visit (INDEPENDENT_AMBULATORY_CARE_PROVIDER_SITE_OTHER): Payer: BLUE CROSS/BLUE SHIELD | Admitting: Cardiology

## 2014-10-10 ENCOUNTER — Encounter: Payer: Self-pay | Admitting: Cardiology

## 2014-10-10 VITALS — BP 110/76 | HR 64 | Ht 62.0 in | Wt 171.0 lb

## 2014-10-10 DIAGNOSIS — R002 Palpitations: Secondary | ICD-10-CM

## 2014-10-10 DIAGNOSIS — E785 Hyperlipidemia, unspecified: Secondary | ICD-10-CM | POA: Diagnosis not present

## 2014-10-10 DIAGNOSIS — I471 Supraventricular tachycardia: Secondary | ICD-10-CM | POA: Diagnosis not present

## 2014-10-10 DIAGNOSIS — R000 Tachycardia, unspecified: Secondary | ICD-10-CM

## 2014-10-10 NOTE — Patient Instructions (Signed)
Your physician recommends that you continue on your current medications as directed. Please refer to the Current Medication list given to you today.     Your physician wants you to follow-up in: 6 MONTHS WITH DR NELSON You will receive a reminder letter in the mail two months in advance. If you don't receive a letter, please call our office to schedule the follow-up appointment.  

## 2014-10-10 NOTE — Progress Notes (Signed)
Patient ID: Samantha Andersen, female   DOB: 04/19/1965, 49 y.o.   MRN: [id number]    Patient Name: Samantha Andersen Date of Encounter: 10/10/2014  Primary Care Provider:  Luz Lex, MD Primary Cardiologist:  Dorothy Spark  Problem List   Past Medical History  Diagnosis Date  . PCOS (polycystic ovarian syndrome)   . Anemia   . Hyperlipidemia   . Anxiety   . Hot flashes   . Joint pain   . Breast cancer     left  . Spondyloarthritis   . Complication of anesthesia     tachycardic, also reports that she needed increased anesth. because she has a high threshold for medicine/sedation  . PONV (postoperative nausea and vomiting)     also reports N&V accompanies anxiety  . 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 Celiac disease    Past Surgical History  Procedure Laterality Date  . Abdominal hysterectomy    . Cesarean section      x2  . Simple mastectomy with axillary sentinel node biopsy  11/24/2011    Procedure: SIMPLE MASTECTOMY WITH AXILLARY SENTINEL NODE BIOPSY;  Surgeon: Haywood Lasso, MD;  Location: [facility];  Service: General;  Laterality: Left;  Bilateral Total Mastectomy and Left Sentinel Node  . Simple mastectomy with axillary sentinel node biopsy  11/24/2011    Procedure: SIMPLE MASTECTOMY;  Surgeon: Haywood Lasso, MD;  Location: [facility];  Service: General;  Laterality: Right;  . Tissue expander placement  11/24/2011    Procedure: TISSUE EXPANDER;  Surgeon: Crissie Reese, MD;  Location: [facility];  Service: Plastics;  Laterality: Bilateral;   Allergies  Allergies  Allergen Reactions  . Doxycycline Shortness Of Breath  . Gluten Meal Shortness Of Breath  . Lipitor [Atorvastatin] Other (See Comments)  . Other Shortness Of Breath    SQUASH  . Tetracyclines & Related Shortness Of Breath and Other (See Comments)    Lethargy   . Ciprofloxacin     unknown  . Crestor [Rosuvastatin] Other (See Comments)    Muscle aches and muscle fatigue  . Metformin And Related     Patient prefers to take Tevo brand metformin XR due to sensitivity rection to other brands. Patient is allergic to gluten and must have gluten free Metformin   HPI  49 year old female with new diagnosis of left breast cancers DCIS stage 0 and atypical lobular hyperplasia of the right breast. Patient is status post bilateral mastectomies with immediate reconstruction. She was referred to [place] for concerns of frequent tachycardias with 1 episode observed during diagnostic colonoscopy. The patient has history of polycystic ovary syndrome with chronic persistent bleeding over 26 years until she underwent hysterectomy 2 years ago. She still has preserved ovaries. She was also diagnosed with anemia as a consequence of chronic bleeding for which she takes iron pills. She was also diagnosed with vitamin D deficiency and most recently with celiac disease and was started on gluten-free diet just 3 weeks ago. Family history includes myocardial infarction in grandfather at age of 33. Patient describes several different type of chest pain some of them associated with palpitations, described as left-sided, sharp, and sometimes associated with dizziness. Those happen frequently and usually have sudden start. The patient is fairly active she walks 5K 5 times a week and usually doesn't have physical limitation in regards to shortness of breath, dizziness, chest pain or syncope. Patient records states that she has history of hyperlipidemia we can know the actual number of her primary care  physician chest lipid profile. We will try to obtain.  The patient is coming after one month, she reports that she continues having palpitations that last seconds to minutes and sometimes are associated with dizziness. However she hasn't had any in the last couple of weeks and didn't have any during her Holter monitoring. Once the, they last for few days. She also mentions that when she had MRI of her brain there were abnormal lesions and  wonders if those might be related to mini strokes.  The patient, after one year, she states that her optic patient has been came less frequent, she was diagnosed with gluten enteropathy and has been on the gluten-free diet that improved her symptoms. 48 hour Holter monitor last year was completely normal. She hasn't been exercises as much as she was last year. In the last year visit recheck her cholesterol and her LDL cholesterol was 194 and she was started on atorvastatin 10 mg daily that gave her very significant side effects with weight gain, generalized muscle and joint pain. She denies any chest pain, she is also complaining of upper extremity especially hand swelling while she is walking. She was also diagnosed with ankylosing spondylitis and is asking if that has any effect on her heart.  09/26/2014 - the patient is coming after 6 months, she was doing well for a while but started to have palpitations with increased HR up to 180 every 6-7 days always at 7=8 am lasting for about 20-30 minutes associated with some dizziness. No chest pain.  10/10/14 - 48 hour Holter monitor showed inappropriate sinus tachycardia, she was started on carvedilol 3.125 mg po BID that she hasn't started to take yet.  No chest pain or syncope.   Home Medications  Prior to Admission medications   Medication Sig Start Date End Date Taking? Authorizing Provider  diclofenac (VOLTAREN) 0.1 % ophthalmic solution 4 (four) times daily.   Yes Historical Provider, MD  ibuprofen (ADVIL,MOTRIN) 100 MG tablet Take 100 mg by mouth as needed.   Yes Historical Provider, MD  meloxicam (MOBIC) 15 MG tablet Take 15 mg by mouth daily.  02/08/13  Yes Historical Provider, MD  metFORMIN (GLUCOPHAGE-XR) 500 MG 24 hr tablet Take 500 mg by mouth daily with breakfast.   Yes Historical Provider, MD  OVER THE COUNTER MEDICATION 1,200 mg. CATAPLEX   Yes Historical Provider, MD  VITAMIN D, ERGOCALCIFEROL, PO Take by mouth daily.   Yes Historical  Provider, MD   Family History  Family History  Problem Relation Age of Onset  . Lung cancer Maternal Grandfather   . Hemochromatosis Sister   . Prostate cancer Paternal Uncle 64  . Cancer Paternal Uncle 64    unknown type of cancer    Social History  Social History   Social History  . Marital Status: Married    Spouse Name: N/A  . Number of Children: N/A  . Years of Education: N/A   Occupational History  . Not on file.   Social History Main Topics  . Smoking status: Former Smoker -- 2.00 packs/day for 4 years    Quit date: 10/04/1985  . Smokeless tobacco: Not on file  . Alcohol Use: Yes     Comment: occ.  . Drug Use: No  . Sexual Activity: Not on file   Other Topics Concern  . Not on file   Social History Narrative     Review of Systems, as per HPI, otherwise negative General:  No chills, fever, night sweats  or weight changes.  Cardiovascular:  No chest pain, dyspnea on exertion, edema, orthopnea, palpitations, paroxysmal nocturnal dyspnea. Dermatological: No rash, lesions/masses Respiratory: No cough, dyspnea Urologic: No hematuria, dysuria Abdominal:   No nausea, vomiting, diarrhea, bright red blood per rectum, melena, or hematemesis Neurologic:  No visual changes, wkns, changes in mental status. All other systems reviewed and are otherwise negative except as noted above.  Physical Exam  Blood pressure 110/76, pulse 64, height 5\' 2"  (1.575 m), weight 171 lb (77.565 kg).  General: Pleasant, NAD Psych: Normal affect. Neuro: Alert and oriented X 3. Moves all extremities spontaneously. HEENT: Normal  Neck: Supple without bruits or JVD. Lungs:  Resp regular and unlabored, CTA. Heart: RRR no s3, s4, or murmurs. Abdomen: Soft, non-tender, non-distended, BS + x 4.  Extremities: No clubbing, cyanosis or edema. DP/PT/Radials 2+ and equal bilaterally.  Labs:  No results for input(s): CKTOTAL, CKMB, TROPONINI in the last 72 hours. Lab Results  Component Value  Date   WBC 10.3 12/24/2013   HGB 13.3 12/24/2013   HCT 41.2 12/24/2013   MCV 93.8 12/24/2013   PLT 314 12/24/2013   Accessory Clinical Findings  Echocardiogram - Study Conclusions  - HPI and indications: Resting chest pain. - Stress ECG conclusions: The stress ECG was normal. - Baseline: LV global systolic function was normal. The estimated LV ejection fraction was 60%. Normal wall motion; no LV regional wall motion abnormalities. - Peak stress: LV global systolic function was vigorous. Normal wall motion; no LV regional wall motion abnormalities. - Impressions: There is normal increase in thickening and contractility in all segments. This is a normal stress echo. Impressions:  - There is normal increase in thickening and contractility in all segments. This is a normal stress echo.  ECG - normal sinus rhythm, 80 beats per minute, normal EKG.     Assessment & Plan  Number pleasant 49 year old female with prior medical history of breast cancer in remission, vitamin D deficiency, anemia, PCOS and known insulin-dependent diabetes mellitus who is coming for concerns of tachycardia and atypical chest pain.  1. Palpitations - improved with gluten-free diet.Now worsening again. We will start 48 hour holter monitor to evaluate for rthythm during tachycardic episodes. No clear exacerbating factors.   A year ago a 48 hour Holter to was completely normal there were no arrhythmias. Her average heart rate was normal. Her stress echocardiogram was completely normal with normal heart anatomy and no concern for ischemia.  Since her episodes of palpitations come in clusters we will order. Repeat 48 hour holter monitor showed that she has Inappropriate sinus tachycardia, and has no other arrhythmias present. We will start carvedilol 3.125 mg po BID.  She is also advised to hydrate properly and follow up with hem-onc as she has h/o lifetime anemia (prior to hysterectomy, h/o DVT and splinter  hemorrhages).  2. Hyperlipidemia - possible genetic component as her LDL is 194 and she is on a great diet and exercise program. Seen in the lipid clinic, didn't tolerate statins, stopped using them.  Followup in 6 months. l Dorothy Spark, MD, [facility] 10/10/2014, 1:55 PM

## 2014-10-15 ENCOUNTER — Ambulatory Visit: Payer: Self-pay | Admitting: Cardiology

## 2014-11-07 ENCOUNTER — Telehealth: Payer: Self-pay | Admitting: *Deleted

## 2014-11-07 ENCOUNTER — Encounter: Payer: Self-pay | Admitting: Cardiology

## 2014-11-07 MED ORDER — METOPROLOL TARTRATE 25 MG PO TABS: ORAL_TABLET | ORAL | Status: DC

## 2014-11-07 NOTE — Telephone Encounter (Signed)
>','<<   Less Detail',event)" href="javascript:;">More Detail >>   RE: Visit Follow-Up Question    Dorothy Spark, MD    Sent: Fri November 07, 2014 3:52 PM    To: Hetty Blend, RN        Message     Please advise her to drink plenty of fluids. We can try metoprolol 12.5 mg po bid instead of carvedilol.         Thank you,        KN        ----- Message -----     From: Hetty Blend, RN     Sent: 11/07/2014  3:40 PM      To: Dorothy Spark, MD, Nuala Alpha, LPN    Subject: Melton Alar: Visit Follow-Up Question                         ----- Message -----     From: Kathline Magic     Sent: 11/07/2014  9:24 AM      To: Rebeca Alert [facility] Triage    Subject: Visit Follow-Up Question                   You asked me to let you know how I am doing on the Beta blocker. It has been 3 weeks since I started it. There is no change in my heart rate and it seems that it is worse (my fitbit hr tracks it). My heart rate spikes to 170 but this time instead of 10-15 minutes it is now an hour or more. My hands and feet feel puffy and the past 2 days it has been difficult to breath when there is any activity and even when resting. I am tired and sleepy more. But I do feel better in the mornings until an hour after I take the medicine. I keep thinking that I need to adjust to the medicine and I will be better. Is there a better beta blocker? Are there outside factors I can be doing? It just doesn't seem to be working well. Please advise. Thank you and Happy Friday:)    Spoke w/pt and advised Dr. Meda Coffee changing Carvedilol to Metoprolol 12.5 mg BID.  Will send Rx into [facility] on [address].

## 2014-11-11 NOTE — Telephone Encounter (Signed)
This mychart message was complete by Dr Meda Coffee and RN Rodena Piety on 11/07/14

## 2014-12-15 ENCOUNTER — Other Ambulatory Visit (HOSPITAL_BASED_OUTPATIENT_CLINIC_OR_DEPARTMENT_OTHER): Payer: BLUE CROSS/BLUE SHIELD

## 2014-12-15 DIAGNOSIS — C50419 Malignant neoplasm of upper-outer quadrant of unspecified female breast: Secondary | ICD-10-CM

## 2014-12-15 LAB — COMPREHENSIVE METABOLIC PANEL
ALT: 30 U/L (ref 0–55)
AST: 23 U/L (ref 5–34)
Albumin: 3.7 g/dL (ref 3.5–5.0)
Alkaline Phosphatase: 53 U/L (ref 40–150)
Anion Gap: 11 mEq/L (ref 3–11)
BILIRUBIN TOTAL: 0.31 mg/dL (ref 0.20–1.20)
BUN: 13.5 mg/dL (ref 7.0–26.0)
CO2: 21 meq/L — AB (ref 22–29)
Calcium: 9.2 mg/dL (ref 8.4–10.4)
Chloride: 106 mEq/L (ref 98–109)
Creatinine: 0.8 mg/dL (ref 0.6–1.1)
EGFR: >90 mL/min/{1.73_m2} (ref >90)
GLUCOSE: 98 mg/dL (ref 70–140)
POTASSIUM: 4.2 meq/L (ref 3.5–5.1)
SODIUM: 138 meq/L (ref 136–145)
TOTAL PROTEIN: 7 g/dL (ref 6.4–8.3)

## 2014-12-15 LAB — CBC WITH DIFFERENTIAL/PLATELET
BASO%: 0.6 % (ref 0.0–2.0)
Basophils Absolute: 0 10*3/uL (ref 0.0–0.1)
EOS%: 2.9 % (ref 0.0–7.0)
Eosinophils Absolute: 0.2 10*3/uL (ref 0.0–0.5)
HEMATOCRIT: 42 % (ref 34.8–46.6)
HGB: 13.7 g/dL (ref 11.6–15.9)
LYMPH#: 1.9 10*3/uL (ref 0.9–3.3)
LYMPH%: 25.8 % (ref 14.0–49.7)
MCH: 30 pg (ref 25.1–34.0)
MCHC: 32.6 g/dL (ref 31.5–36.0)
MCV: 92.2 fL (ref 79.5–101.0)
MONO#: 0.4 10*3/uL (ref 0.1–0.9)
MONO%: 5.4 % (ref 0.0–14.0)
NEUT%: 65.3 % (ref 38.4–76.8)
NEUTROS ABS: 4.7 10*3/uL (ref 1.5–6.5)
Platelets: 253 10*3/uL (ref 145–400)
RBC: 4.55 10*6/uL (ref 3.70–5.45)
RDW: 13.6 % (ref 11.2–14.5)
WBC: 7.2 10*3/uL (ref 3.9–10.3)

## 2014-12-15 LAB — VITAMIN B12: Vitamin B-12: 363 pg/mL (ref 211–911)

## 2014-12-15 LAB — FERRITIN: Ferritin: 55 ng/ml (ref 9–269)

## 2014-12-15 LAB — IRON AND TIBC
%SAT: 20 % — ABNORMAL LOW (ref 21–57)
Iron: 57 ug/dL (ref 41–142)
TIBC: 281 ug/dL (ref 236–444)
UIBC: 225 ug/dL (ref 120–384)

## 2014-12-15 LAB — FOLATE: Folate: 8 ng/mL

## 2014-12-21 NOTE — Assessment & Plan Note (Signed)
1 DCIS of the left breast- ER positive PR positive/ atypical lobular hyperplasia of right breast . Patient is now status post bilateral mastectomies with bilateral breast implants.   Surveillance: Today's breast exam is normal.  2. Celiac disease: Patient is very strictly on a gluten-free diet. Since she has been a gluten-free diet, she has not been iron deficient. She continues to be on B-12 and vitamin D supplementation.  3. 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 In fact she has been iron deficient in the past.  Follow-up in 1 year

## 2014-12-22 ENCOUNTER — Telehealth: Payer: Self-pay | Admitting: Hematology and Oncology

## 2014-12-22 ENCOUNTER — Ambulatory Visit (HOSPITAL_BASED_OUTPATIENT_CLINIC_OR_DEPARTMENT_OTHER): Payer: BLUE CROSS/BLUE SHIELD | Admitting: Hematology and Oncology

## 2014-12-22 VITALS — BP 102/71 | HR 74 | Temp 98.5°F | Resp 18 | Ht 62.0 in | Wt 176.6 lb

## 2014-12-22 DIAGNOSIS — K900 Celiac disease: Secondary | ICD-10-CM

## 2014-12-22 DIAGNOSIS — D0512 Intraductal carcinoma in situ of left breast: Secondary | ICD-10-CM | POA: Diagnosis not present

## 2014-12-22 DIAGNOSIS — C50412 Malignant neoplasm of upper-outer quadrant of left female breast: Secondary | ICD-10-CM

## 2014-12-22 DIAGNOSIS — Z148 Genetic carrier of other disease: Secondary | ICD-10-CM

## 2014-12-22 DIAGNOSIS — N6091 Unspecified benign mammary dysplasia of right breast: Secondary | ICD-10-CM

## 2014-12-22 DIAGNOSIS — R000 Tachycardia, unspecified: Secondary | ICD-10-CM

## 2014-12-22 NOTE — Telephone Encounter (Signed)
Appointments made and avs printed for patient °

## 2014-12-22 NOTE — Addendum Note (Signed)
Addended by: Prentiss Bells on: 12/22/2014 06:07 PM   Modules accepted: Medications

## 2014-12-22 NOTE — Addendum Note (Signed)
Addended by: Prentiss Bells on: 12/22/2014 06:10 PM   Modules accepted: Medications

## 2014-12-22 NOTE — Progress Notes (Signed)
Patient Care Team: Louretta Shorten, MD as PCP - General (Obstetrics and Gynecology)  DIAGNOSIS: Primary cancer of upper outer quadrant of left female breast [facility])   Staging form: Breast, AJCC 7th Edition     Clinical: Stage 0 (Tis (DCIS), N0, cM0) - Unsigned       Staging comments: Staged at breast conference 9.25.13        Prognostic indicators: ER 99%  PR = 69%      Pathologic: Stage 0 (Tis, N0, cM0) - Signed by Haywood Lasso, MD on 12/14/2011       Prognostic indicators: ER 99%  PR = 69%    SUMMARY OF ONCOLOGIC HISTORY:   Primary cancer of upper outer quadrant of left female breast ([place])   10/06/2011 Procedure genetic counseling and testing found to be negative for BRCA1 and 2 gene mutations   11/24/2011 Surgery left mastectomy: DCIS intermediate grade 5.8 cm 3 SL and negative, ER 99%, PR 69%; right mastectomy: lobular neoplasia atypical lobular hyperplasia   11/24/2011 Surgery immediate reconstruction with tissue expander by Dr. Harlow Mares    CHIEF COMPLIANT: complaining of shortness of breath and intermittent tachycardia  INTERVAL HISTORY: KIANNA BILLET is a 49 year old with above-mentioned history left breast DCIS. She had bilateral mastectomies and is currently on surveillance. She complains of shortness of breath to minimal exertion intermittently. She also was found to have tachycardia and was started on metoprolol. She thinks she has gotten more short of breath since metoprolol was initiated. Denies any chest pain or palpitations. She walks 5K every day accompanied by her husband.   REVIEW OF SYSTEMS:   Constitutional: Denies fevers, chills or abnormal weight loss Eyes: Denies blurriness of vision Ears, nose, mouth, throat, and face: Denies mucositis or sore throat Respiratory: Denies cough, dyspnea or wheezes Cardiovascular: occasional tachycardia Gastrointestinal:  Denies nausea, heartburn or change in bowel habits Skin: Denies abnormal skin rashes Lymphatics: Denies  new lymphadenopathy or easy bruising Neurological:Denies numbness, tingling or new weaknesses Behavioral/Psych: Mood is stable, no new changes  Breast:  denies any pain or lumps or nodules in either reconstructed breasts All other systems were reviewed with the patient and are negative.  I have reviewed the past medical history, past surgical history, social history and family history with the patient and they are unchanged from previous note.  ALLERGIES:  is allergic to doxycycline; gluten meal; lipitor; other; tetracyclines & related; ciprofloxacin; crestor; and metformin and related.  MEDICATIONS:  Current Outpatient Prescriptions  Medication Sig Dispense Refill  . diclofenac sodium (VOLTAREN) 1 % GEL Apply topically 4 (four) times daily as needed.    Marland Kitchen ibuprofen (ADVIL,MOTRIN) 100 MG tablet Take 100 mg by mouth as needed.    . metFORMIN (GLUCOPHAGE-XR) 500 MG 24 hr tablet Take 500 mg by mouth 2 (two) times daily.     . metoprolol tartrate (LOPRESSOR) 25 MG tablet Take 1/2 tablet (12.5 mg) twice a day 60 tablet 3  . VITAMIN D, ERGOCALCIFEROL, PO Take 5,000 Units by mouth daily.      No current facility-administered medications for this visit.    PHYSICAL EXAMINATION: ECOG PERFORMANCE STATUS: 1 - Symptomatic but completely ambulatory  Filed Vitals:   12/22/14 1130  BP: 102/71  Pulse: 74  Temp: 98.5 F (36.9 C)  Resp: 18   Filed Weights   12/22/14 1130  Weight: 176 lb 9.6 oz (80.105 kg)    GENERAL:alert, no distress and comfortable SKIN: skin color, texture, turgor are normal, no rashes or significant  lesions EYES: normal, Conjunctiva are pink and non-injected, sclera clear OROPHARYNX:no exudate, no erythema and lips, buccal mucosa, and tongue normal  NECK: supple, thyroid normal size, non-tender, without nodularity LYMPH:  no palpable lymphadenopathy in the cervical, axillary or inguinal LUNGS: clear to auscultation and percussion with normal breathing effort HEART:  regular rate & rhythm and no murmurs and no lower extremity edema ABDOMEN:abdomen soft, non-tender and normal bowel sounds Musculoskeletal:no cyanosis of digits and no clubbing  NEURO: alert & oriented x 3 with fluent speech, no focal motor/sensory deficits BREAST:no palpable lumps or nodules in either reconstructed breast or axilla. (exam performed in the presence of a chaperone)  LABORATORY DATA:  I have reviewed the data as listed   Chemistry      Component Value Date/Time   NA 138 12/15/2014 0825   NA 138 04/04/2014 1022   K 4.2 12/15/2014 0825   K 4.2 04/04/2014 1022   CL 103 04/04/2014 1022   CL 108* 05/18/2012 1041   CO2 21* 12/15/2014 0825   CO2 22 04/04/2014 1022   BUN 13.5 12/15/2014 0825   BUN 13 04/04/2014 1022   CREATININE 0.8 12/15/2014 0825   CREATININE 0.65 04/04/2014 1022   CREATININE 0.72 11/17/2011 1514      Component Value Date/Time   CALCIUM 9.2 12/15/2014 0825   CALCIUM 9.5 04/04/2014 1022   ALKPHOS 53 12/15/2014 0825   ALKPHOS 45 07/28/2014 0844   AST 23 12/15/2014 0825   AST 16 07/28/2014 0844   ALT 30 12/15/2014 0825   ALT 17 07/28/2014 0844   BILITOT 0.31 12/15/2014 0825   BILITOT 0.3 07/28/2014 0844       Lab Results  Component Value Date   WBC 7.2 12/15/2014   HGB 13.7 12/15/2014   HCT 42.0 12/15/2014   MCV 92.2 12/15/2014   PLT 253 12/15/2014   NEUTROABS 4.7 12/15/2014   ASSESSMENT & PLAN:  Primary cancer of upper outer quadrant of left female breast ([place]) 1 DCIS of the left breast- ER positive PR positive/ atypical lobular hyperplasia of right breast . Patient is now status post bilateral mastectomies with bilateral breast implants.   Surveillance: Today's breast exam is normal.  2. Celiac disease: Patient is very strictly on a gluten-free diet. Since she has been a gluten-free diet, she has not been iron deficient. She continues to be on B-12 and vitamin D supplementation.  3. 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 In fact she has been iron deficient in the past. 4. Intermittent tachycardia: Unclear etiology patient has a cardiologist. She plans to discuss this further with her cardiologist. She wonders if her shortness of breath could be related to metoprolol. I instructed her to see her primary care physician at the time when she has these symptoms.  Patient and her husband work in [employer] and travel internationally all the time. Her daughter is going to [place] help AIDS patients.  Follow-up in 1 year   No orders of the defined types were placed in this encounter.   The patient has a good understanding of the overall plan. she agrees with it. she will call with any problems that may develop before the next visit here.   Rulon Eisenmenger, MD 12/22/2014

## 2015-01-06 ENCOUNTER — Telehealth: Payer: Self-pay | Admitting: Genetic Counselor

## 2015-01-06 NOTE — Telephone Encounter (Signed)
Patient called to see if there were updates to her BRIP1 VUS.  LM on VM that her VUS is still a VUS and to call me if she had further questions or wants to discuss.  My background information is:  Called [facility] and this is still a VUS.  Looked in [place] and both [facility] and [facility] classify this as a VUS as well.  Cephus Shelling has seen this VUS 16 times.  The patient was the third person they saw with this, so they see this VUS several times a year. No change to classification.

## 2015-01-19 ENCOUNTER — Encounter: Payer: Self-pay | Admitting: Cardiology

## 2015-01-20 MED ORDER — DILTIAZEM HCL ER COATED BEADS 120 MG PO CP24: 120.0000 mg | ORAL_CAPSULE | Freq: Every day | ORAL | Status: DC

## 2015-01-20 NOTE — Telephone Encounter (Signed)
I would increase morning carvedilol to 6.25 and continue evening dose at 3.125 mg po.   Notified the pt that Dr Meda Coffee reviewed her mychart message and recommends the above mentioned.  Per the pt she states she is no longer taking metoprolol and hasn't taken this in several months.  Pt states she forgot to update our office about this change.  Pt states she cannot tolerate carvedilol, as well as taking metoprolol.  Pt wishes to not be started on this medication.  Informed the pt that carvedilol still did show in her med list, so I will update this change, and send Dr Meda Coffee a message back to advise on pts mychart complaint.  Pt verbalized understanding and agrees with this plan.

## 2015-01-20 NOTE — Telephone Encounter (Signed)
Notified the pt that Dr Meda Coffee advises that being she cannot tolerate carvedilol, she recommends that the pt start taking cardizem CD 120 mg po daily.  Confirmed the pts pharmacy of choice.  Pt request only a month supply of this medication to be called into her pharmacy of choice, to make sure she can tolerate this.  Informed the pt that would be perfectly fine, and she should mychart [place] back with any concerns.  Pt verbalized understanding and agrees with this plan.

## 2015-01-20 NOTE — Telephone Encounter (Signed)
She could try cardizem cd 120 mg po daily.  Left a message for the pt to call back to endorse new recommendations mentioned above, per Dr Meda Coffee, for pts recent mychart message of elevated HR.

## 2015-03-15 ENCOUNTER — Other Ambulatory Visit: Payer: Self-pay | Admitting: Cardiology

## 2015-04-21 ENCOUNTER — Encounter: Payer: Self-pay | Admitting: Cardiology

## 2015-04-22 ENCOUNTER — Other Ambulatory Visit: Payer: Self-pay | Admitting: *Deleted

## 2015-04-22 MED ORDER — DILTIAZEM HCL ER COATED BEADS 120 MG PO CP24: ORAL_CAPSULE | ORAL | Status: DC

## 2015-04-23 DIAGNOSIS — F40243 Fear of flying: Secondary | ICD-10-CM | POA: Diagnosis not present

## 2015-04-23 DIAGNOSIS — Z792 Long term (current) use of antibiotics: Secondary | ICD-10-CM | POA: Diagnosis not present

## 2015-05-11 ENCOUNTER — Other Ambulatory Visit: Payer: Self-pay | Admitting: Cardiology

## 2015-05-20 ENCOUNTER — Encounter: Payer: Self-pay | Admitting: Cardiology

## 2015-05-20 ENCOUNTER — Ambulatory Visit (INDEPENDENT_AMBULATORY_CARE_PROVIDER_SITE_OTHER): Payer: BLUE CROSS/BLUE SHIELD | Admitting: Cardiology

## 2015-05-20 VITALS — BP 100/74 | HR 91 | Ht 62.0 in | Wt 174.4 lb

## 2015-05-20 DIAGNOSIS — R002 Palpitations: Secondary | ICD-10-CM | POA: Insufficient documentation

## 2015-05-20 DIAGNOSIS — E785 Hyperlipidemia, unspecified: Secondary | ICD-10-CM

## 2015-05-20 DIAGNOSIS — R42 Dizziness and giddiness: Secondary | ICD-10-CM | POA: Insufficient documentation

## 2015-05-20 DIAGNOSIS — R072 Precordial pain: Secondary | ICD-10-CM | POA: Diagnosis not present

## 2015-05-20 DIAGNOSIS — R079 Chest pain, unspecified: Secondary | ICD-10-CM | POA: Diagnosis not present

## 2015-05-20 NOTE — Patient Instructions (Signed)
Medication Instructions:   Your physician recommends that you continue on your current medications as directed. Please refer to the Current Medication list given to you today.     Testing/Procedures:  Your physician has requested that you have cardiac CT. Cardiac computed tomography (CT) is a painless test that uses an x-ray machine to take clear, detailed pictures of your heart. For further information please visit [email or web address]. Please follow instruction sheet as given.     Follow-Up:  AT DR NELSON'S NEXT AVAILABLE APPOINTMENT       If you need a refill on your cardiac medications before your next appointment, please call your pharmacy.

## 2015-05-20 NOTE — Progress Notes (Signed)
Patient ID: Samantha Andersen, female   DOB: 03/29/1965, 50 y.o.   MRN: [id number]    Patient Name: Samantha Andersen Date of Encounter: 05/20/2015  Primary Care Provider:  Luz Lex, MD Primary Cardiologist:  Dorothy Spark  Problem List   Past Medical History  Diagnosis Date  . PCOS (polycystic ovarian syndrome)   . Anemia   . Hyperlipidemia   . Anxiety   . Hot flashes   . Joint pain   . Breast cancer ([place])     left  . Spondyloarthritis   . Complication of anesthesia     tachycardic, also reports that she needed increased anesth. because she has a high threshold for medicine/sedation  . PONV (postoperative nausea and vomiting)     also reports N&V accompanies anxiety  . 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 Celiac disease    Past Surgical History  Procedure Laterality Date  . Abdominal hysterectomy    . Cesarean section      x2  . Simple mastectomy with axillary sentinel node biopsy  11/24/2011    Procedure: SIMPLE MASTECTOMY WITH AXILLARY SENTINEL NODE BIOPSY;  Surgeon: Haywood Lasso, MD;  Location: [facility];  Service: General;  Laterality: Left;  Bilateral Total Mastectomy and Left Sentinel Node  . Simple mastectomy with axillary sentinel node biopsy  11/24/2011    Procedure: SIMPLE MASTECTOMY;  Surgeon: Haywood Lasso, MD;  Location: [facility];  Service: General;  Laterality: Right;  . Tissue expander placement  11/24/2011    Procedure: TISSUE EXPANDER;  Surgeon: Crissie Reese, MD;  Location: [facility];  Service: Plastics;  Laterality: Bilateral;   Allergies  Allergies  Allergen Reactions  . Doxycycline Shortness Of Breath  . Gluten Meal Shortness Of Breath  . Lipitor [Atorvastatin] Other (See Comments)  . Other Shortness Of Breath    SQUASH  . Tetracyclines & Related Shortness Of Breath and Other (See Comments)    Lethargy   . Ciprofloxacin     unknown  . Crestor [Rosuvastatin] Other (See Comments)    Muscle aches and muscle fatigue  . Metformin And  Related     Patient prefers to take Tevo brand metformin XR due to sensitivity rection to other brands. Patient is allergic to gluten and must have gluten free Metformin   HPI  50 year old female with new diagnosis of left breast cancers DCIS stage 0 and atypical lobular hyperplasia of the right breast. Patient is status post bilateral mastectomies with immediate reconstruction. She was referred to [place] for concerns of frequent tachycardias with 1 episode observed during diagnostic colonoscopy. The patient has history of polycystic ovary syndrome with chronic persistent bleeding over 26 years until she underwent hysterectomy 2 years ago. She still has preserved ovaries. She was also diagnosed with anemia as a consequence of chronic bleeding for which she takes iron pills. She was also diagnosed with vitamin D deficiency and most recently with celiac disease and was started on gluten-free diet just 3 weeks ago. Family history includes myocardial infarction in grandfather at age of 51. Patient describes several different type of chest pain some of them associated with palpitations, described as left-sided, sharp, and sometimes associated with dizziness. Those happen frequently and usually have sudden start. The patient is fairly active she walks 5K 5 times a week and usually doesn't have physical limitation in regards to shortness of breath, dizziness, chest pain or syncope. Patient records states that she has history of hyperlipidemia we can know the actual number of her  primary care physician chest lipid profile. We will try to obtain.  The patient is coming after one month, she reports that she continues having palpitations that last seconds to minutes and sometimes are associated with dizziness. However she hasn't had any in the last couple of weeks and didn't have any during her Holter monitoring. Once the, they last for few days. She also mentions that when she had MRI of her brain there were abnormal  lesions and wonders if those might be related to mini strokes.  The patient, after one year, she states that her optic patient has been came less frequent, she was diagnosed with gluten enteropathy and has been on the gluten-free diet that improved her symptoms. 48 hour Holter monitor last year was completely normal. She hasn't been exercises as much as she was last year. In the last year visit recheck her cholesterol and her LDL cholesterol was 194 and she was started on atorvastatin 10 mg daily that gave her very significant side effects with weight gain, generalized muscle and joint pain. She denies any chest pain, she is also complaining of upper extremity especially hand swelling while she is walking. She was also diagnosed with ankylosing spondylitis and is asking if that has any effect on her heart.  09/26/2014 - the patient is coming after 6 months, she was doing well for a while but started to have palpitations with increased HR up to 180 every 6-7 days always at 7=8 am lasting for about 20-30 minutes associated with some dizziness. No chest pain.  05/20/2015 the patient is coming after 6 months, she didn't tolerate carvedilol metoprolol she was switched to Cardizem that she tolerates okay. However she has noticed worsening tachycardia on exertion, her heart rate at rest is normal but with minimal exertion goes to 190 and states that entire time. She tries to walk 5 kg every day with her husband, but recently had to stop and go home as she can't not endoleak. She has becoming progressively more short of breath and experienced chest tightness with it as well with some dizziness. She was previously tried on multiple stockings but couldn't tolerated.   Home Medications  Prior to Admission medications   Medication Sig Start Date End Date Taking? Authorizing Provider  diclofenac (VOLTAREN) 0.1 % ophthalmic solution 4 (four) times daily.   Yes Historical Provider, MD  ibuprofen (ADVIL,MOTRIN) 100 MG  tablet Take 100 mg by mouth as needed.   Yes Historical Provider, MD  meloxicam (MOBIC) 15 MG tablet Take 15 mg by mouth daily.  02/08/13  Yes Historical Provider, MD  metFORMIN (GLUCOPHAGE-XR) 500 MG 24 hr tablet Take 500 mg by mouth daily with breakfast.   Yes Historical Provider, MD  OVER THE COUNTER MEDICATION 1,200 mg. CATAPLEX   Yes Historical Provider, MD  VITAMIN D, ERGOCALCIFEROL, PO Take by mouth daily.   Yes Historical Provider, MD   Family History  Family History  Problem Relation Age of Onset  . Lung cancer Maternal Grandfather   . Hemochromatosis Sister   . Prostate cancer Paternal Uncle 39  . Cancer Paternal Uncle 62    unknown type of cancer    Social History  Social History   Social History  . Marital Status: Married    Spouse Name: N/A  . Number of Children: N/A  . Years of Education: N/A   Occupational History  . Not on file.   Social History Main Topics  . Smoking status: Former Smoker -- 2.00 packs/day for  4 years    Quit date: 10/04/1985  . Smokeless tobacco: Not on file  . Alcohol Use: Yes     Comment: occ.  . Drug Use: No  . Sexual Activity: Not on file   Other Topics Concern  . Not on file   Social History Narrative     Review of Systems, as per HPI, otherwise negative General:  No chills, fever, night sweats or weight changes.  Cardiovascular:  No chest pain, dyspnea on exertion, edema, orthopnea, palpitations, paroxysmal nocturnal dyspnea. Dermatological: No rash, lesions/masses Respiratory: No cough, dyspnea Urologic: No hematuria, dysuria Abdominal:   No nausea, vomiting, diarrhea, bright red blood per rectum, melena, or hematemesis Neurologic:  No visual changes, wkns, changes in mental status. All other systems reviewed and are otherwise negative except as noted above.  Physical Exam  Blood pressure 100/74, pulse 91, height 5\' 2"  (1.575 m), weight 174 lb 6.4 oz (79.107 kg).  General: Pleasant, NAD Psych: Normal affect. Neuro:  Alert and oriented X 3. Moves all extremities spontaneously. HEENT: Normal  Neck: Supple without bruits or JVD. Lungs:  Resp regular and unlabored, CTA. Heart: RRR no s3, s4, or murmurs. Abdomen: Soft, non-tender, non-distended, BS + x 4.  Extremities: No clubbing, cyanosis or edema. DP/PT/Radials 2+ and equal bilaterally.  Labs:  No results for input(s): CKTOTAL, CKMB, TROPONINI in the last 72 hours. Lab Results  Component Value Date   WBC 7.2 12/15/2014   HGB 13.7 12/15/2014   HCT 42.0 12/15/2014   MCV 92.2 12/15/2014   PLT 253 12/15/2014   Accessory Clinical Findings  Echocardiogram - Study Conclusions  - HPI and indications: Resting chest pain. - Stress ECG conclusions: The stress ECG was normal. - Baseline: LV global systolic function was normal. The estimated LV ejection fraction was 60%. Normal wall motion; no LV regional wall motion abnormalities. - Peak stress: LV global systolic function was vigorous. Normal wall motion; no LV regional wall motion abnormalities. - Impressions: There is normal increase in thickening and contractility in all segments. This is a normal stress echo. Impressions:  - There is normal increase in thickening and contractility in all segments. This is a normal stress echo.  ECG - normal sinus rhythm, 80 beats per minute, normal EKG.     Assessment & Plan  Number pleasant 50 year old female with prior medical history of breast cancer in remission, vitamin D deficiency, anemia, PCOS and known insulin-dependent diabetes mellitus who is coming for concerns of tachycardia and atypical chest pain.  1. Chest pain, shortness of breath, typical exertional with worsening functional capacity and increasing heart rate with minimal exertion, she had negative stress test 2 years ago we will schedule coronary CTA to evaluate for anatomy.  2. Hyperlipidemia - with LDL of 133 in a diabetic person, this is very concerning is her goal should be less  than 70, she didn't tolerate multiple statins, coronary CTA and Khosrow score will be fairly helpful here to determine what degree of plaque does she have any she would qualify for PCS K9 inhibitors.  3. Inappropriate sinus tachycardia - she didn't tolerate beta blockers, she is okay and Cardizem but no improvement in regards to heart rate. We will start carvedilol 3.125 mg po BID.   Follow-up in 2 months.  Dorothy Spark, MD, [facility] 05/20/2015, 8:32 AM

## 2015-05-25 ENCOUNTER — Encounter (HOSPITAL_COMMUNITY): Payer: Self-pay

## 2015-05-25 ENCOUNTER — Ambulatory Visit (HOSPITAL_COMMUNITY)
Admission: RE | Admit: 2015-05-25 | Discharge: 2015-05-25 | Disposition: A | Discharge status: Home / Self Care | Payer: BLUE CROSS/BLUE SHIELD | Source: Ambulatory Visit | Attending: Cardiology | Admitting: Cardiology

## 2015-05-25 DIAGNOSIS — K449 Diaphragmatic hernia without obstruction or gangrene: Secondary | ICD-10-CM | POA: Insufficient documentation

## 2015-05-25 DIAGNOSIS — R072 Precordial pain: Secondary | ICD-10-CM | POA: Diagnosis not present

## 2015-05-25 LAB — POCT I-STAT CREATININE: Creatinine, Ser: 0.7 mg/dL (ref 0.44–1.00)

## 2015-05-25 MED ORDER — METOPROLOL TARTRATE 5 MG/5ML IV SOLN
INTRAVENOUS | Status: AC
  Administered 2015-05-25: 5 mg
  Filled 2015-05-25: qty 5

## 2015-05-25 MED ORDER — NITROGLYCERIN 0.4 MG SL SUBL
0.8000 mg | SUBLINGUAL_TABLET | SUBLINGUAL | Status: DC | PRN
  Administered 2015-05-25: 0.8 mg via SUBLINGUAL

## 2015-05-25 MED ORDER — METOPROLOL TARTRATE 5 MG/5ML IV SOLN
5.0000 mg | INTRAVENOUS | Status: DC | PRN
  Administered 2015-05-25 (×3): 5 mg via INTRAVENOUS

## 2015-05-25 MED ORDER — NITROGLYCERIN 0.4 MG SL SUBL
SUBLINGUAL_TABLET | SUBLINGUAL | Status: AC
  Filled 2015-05-25: qty 2

## 2015-05-25 MED ORDER — METOPROLOL TARTRATE 5 MG/5ML IV SOLN
INTRAVENOUS | Status: AC
  Administered 2015-05-25: 5 mg via INTRAVENOUS
  Filled 2015-05-25: qty 10

## 2015-05-25 MED ORDER — NITROGLYCERIN 0.4 MG SL SUBL
SUBLINGUAL_TABLET | SUBLINGUAL | Status: AC
  Filled 2015-05-25: qty 1

## 2015-05-25 MED ORDER — METOPROLOL TARTRATE 5 MG/5ML IV SOLN
INTRAVENOUS | Status: AC
  Filled 2015-05-25: qty 5

## 2015-05-25 MED ORDER — IOPAMIDOL (ISOVUE-370) INJECTION 76%
INTRAVENOUS | Status: AC
  Administered 2015-05-25: 80 mL
  Filled 2015-05-25: qty 100

## 2015-05-25 NOTE — Progress Notes (Signed)
No metformin til Thursday encouraged po fluids

## 2015-05-26 ENCOUNTER — Telehealth: Payer: Self-pay | Admitting: *Deleted

## 2015-05-26 MED ORDER — RED YEAST RICE 600 MG PO CAPS: 600.0000 mg | ORAL_CAPSULE | Freq: Every day | ORAL | Status: DC

## 2015-05-26 NOTE — Telephone Encounter (Signed)
-----   Message from Samantha Spark, MD sent at 05/26/2015  7:36 AM EDT ----- Her coronary CT shows just minimal non-obstructive CAD with lesions smaller than 25%, I would still recommend red yeast rice 600 mg po daily.  Her chest pain is not related to potential blockages. We need to control her HR better, she should continue to exercise on a regular basis.

## 2015-05-26 NOTE — Telephone Encounter (Signed)
Notified the pt that per Dr Meda Coffee, her coronary CT shows just minimal non-obstructive CAD with lesions smaller than 25%.  Informed the pt that per Dr Meda Coffee, she recommends that she start taking red yeast rice 600 mg po daily.  Informed the pt that per Dr Meda Coffee, her chest pain is not related to potential blockages, and she states that we need to control her HR better, and she should continue to exercise on a regular basis.  Confirmed the pharmacy of choice with the pt.  Pt request that her red yeast rice be sent in as a prescription, vs getting this OTC.  Phoned-in red yeast rice 600 mg po daily to the pharmacist at pts pharmacy[facility].  Pt verbalized understanding and agrees with this plan.

## 2015-06-10 ENCOUNTER — Ambulatory Visit (INDEPENDENT_AMBULATORY_CARE_PROVIDER_SITE_OTHER): Payer: BLUE CROSS/BLUE SHIELD | Admitting: Cardiology

## 2015-06-10 ENCOUNTER — Encounter: Payer: Self-pay | Admitting: Cardiology

## 2015-06-10 VITALS — BP 110/80 | HR 97 | Ht 63.0 in | Wt 174.0 lb

## 2015-06-10 DIAGNOSIS — R0609 Other forms of dyspnea: Secondary | ICD-10-CM

## 2015-06-10 DIAGNOSIS — R072 Precordial pain: Secondary | ICD-10-CM | POA: Diagnosis not present

## 2015-06-10 DIAGNOSIS — I2583 Coronary atherosclerosis due to lipid rich plaque: Secondary | ICD-10-CM

## 2015-06-10 DIAGNOSIS — E785 Hyperlipidemia, unspecified: Secondary | ICD-10-CM | POA: Diagnosis not present

## 2015-06-10 DIAGNOSIS — R000 Tachycardia, unspecified: Secondary | ICD-10-CM | POA: Diagnosis not present

## 2015-06-10 DIAGNOSIS — Z889 Allergy status to unspecified drugs, medicaments and biological substances status: Secondary | ICD-10-CM

## 2015-06-10 DIAGNOSIS — Z789 Other specified health status: Secondary | ICD-10-CM

## 2015-06-10 DIAGNOSIS — R0600 Dyspnea, unspecified: Secondary | ICD-10-CM

## 2015-06-10 DIAGNOSIS — I2510 Atherosclerotic heart disease of native coronary artery without angina pectoris: Secondary | ICD-10-CM

## 2015-06-10 NOTE — Progress Notes (Signed)
Patient ID: Samantha Andersen, female   DOB: 11-05-1965, 50 y.o.   MRN: [id number]    Patient Name: Samantha Andersen Date of Encounter: 06/10/2015  Primary Care Provider:  Luz Lex, MD Primary Cardiologist:  Ena Dawley  Problem List   Past Medical History  Diagnosis Date  . PCOS (polycystic ovarian syndrome)   . Anemia   . Hyperlipidemia   . Anxiety   . Hot flashes   . Joint pain   . Breast cancer ([place])     left  . Spondyloarthritis   . Complication of anesthesia     tachycardic, also reports that she needed increased anesth. because she has a high threshold for medicine/sedation  . PONV (postoperative nausea and vomiting)     also reports N&V accompanies anxiety  . 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 Celiac disease    Past Surgical History  Procedure Laterality Date  . Abdominal hysterectomy    . Cesarean section      x2  . Simple mastectomy with axillary sentinel node biopsy  11/24/2011    Procedure: SIMPLE MASTECTOMY WITH AXILLARY SENTINEL NODE BIOPSY;  Surgeon: Haywood Lasso, MD;  Location: [facility];  Service: General;  Laterality: Left;  Bilateral Total Mastectomy and Left Sentinel Node  . Simple mastectomy with axillary sentinel node biopsy  11/24/2011    Procedure: SIMPLE MASTECTOMY;  Surgeon: Haywood Lasso, MD;  Location: [facility];  Service: General;  Laterality: Right;  . Tissue expander placement  11/24/2011    Procedure: TISSUE EXPANDER;  Surgeon: Crissie Reese, MD;  Location: [facility];  Service: Plastics;  Laterality: Bilateral;   Allergies  Allergies  Allergen Reactions  . Doxycycline Shortness Of Breath  . Gluten Meal Shortness Of Breath  . Lipitor [Atorvastatin] Other (See Comments)  . Other Shortness Of Breath    SQUASH  . Tetracyclines & Related Shortness Of Breath and Other (See Comments)    Lethargy   . Ciprofloxacin     unknown  . Crestor [Rosuvastatin] Other (See Comments)    Muscle aches and muscle fatigue  . Metformin And Related      Patient prefers to take Tevo brand metformin XR due to sensitivity rection to other brands. Patient is allergic to gluten and must have gluten free Metformin   HPI  50 year old female with new diagnosis of left breast cancers DCIS stage 0 and atypical lobular hyperplasia of the right breast. Patient is status post bilateral mastectomies with immediate reconstruction. She was referred to [place] for concerns of frequent tachycardias with 1 episode observed during diagnostic colonoscopy. The patient has history of polycystic ovary syndrome with chronic persistent bleeding over 26 years until she underwent hysterectomy 2 years ago. She still has preserved ovaries. She was also diagnosed with anemia as a consequence of chronic bleeding for which she takes iron pills. She was also diagnosed with vitamin D deficiency and most recently with celiac disease and was started on gluten-free diet just 3 weeks ago. Family history includes myocardial infarction in grandfather at age of 45. Patient describes several different type of chest pain some of them associated with palpitations, described as left-sided, sharp, and sometimes associated with dizziness. Those happen frequently and usually have sudden start. The patient is fairly active she walks 5K 5 times a week and usually doesn't have physical limitation in regards to shortness of breath, dizziness, chest pain or syncope. Patient records states that she has history of hyperlipidemia we can know the actual number of her primary  care physician chest lipid profile. We will try to obtain.  The patient is coming after one month, she reports that she continues having palpitations that last seconds to minutes and sometimes are associated with dizziness. However she hasn't had any in the last couple of weeks and didn't have any during her Holter monitoring. Once the, they last for few days. She also mentions that when she had MRI of her brain there were abnormal lesions and  wonders if those might be related to mini strokes.  The patient, after one year, she states that her optic patient has been came less frequent, she was diagnosed with gluten enteropathy and has been on the gluten-free diet that improved her symptoms. 48 hour Holter monitor last year was completely normal. She hasn't been exercises as much as she was last year. In the last year visit recheck her cholesterol and her LDL cholesterol was 194 and she was started on atorvastatin 10 mg daily that gave her very significant side effects with weight gain, generalized muscle and joint pain. She denies any chest pain, she is also complaining of upper extremity especially hand swelling while she is walking. She was also diagnosed with ankylosing spondylitis and is asking if that has any effect on her heart.  05/20/2015 the patient is coming after 6 months, she didn't tolerate carvedilol metoprolol she was switched to Cardizem that she tolerates okay. However she has noticed worsening tachycardia on exertion, her heart rate at rest is normal but with minimal exertion goes to 190 and states that entire time. She tries to walk 5 kg every day with her husband, but recently had to stop and go home as she can't not endoleak. She has becoming progressively more short of breath and experienced chest tightness with it as well with some dizziness. She was previously tried on multiple stockings but couldn't tolerated.   06/10/2015 - 2 months follow up, she feels well, her HR goes up to 190 with exercise, at rest 70-80, she experiences dyspnea with moderate exertion, continues to walk/run 5 K daily and does yoga twice a week.  She would like to discontinue cardizem. She underwent cardiac CT - minimal CAD and was started on red yeast rice 600 mg po daily, no side effects.  Home Medications  Prior to Admission medications   Medication Sig Start Date End Date Taking? Authorizing Provider  diclofenac (VOLTAREN) 0.1 % ophthalmic  solution 4 (four) times daily.   Yes Historical Provider, MD  ibuprofen (ADVIL,MOTRIN) 100 MG tablet Take 100 mg by mouth as needed.   Yes Historical Provider, MD  meloxicam (MOBIC) 15 MG tablet Take 15 mg by mouth daily.  02/08/13  Yes Historical Provider, MD  metFORMIN (GLUCOPHAGE-XR) 500 MG 24 hr tablet Take 500 mg by mouth daily with breakfast.   Yes Historical Provider, MD  OVER THE COUNTER MEDICATION 1,200 mg. CATAPLEX   Yes Historical Provider, MD  VITAMIN D, ERGOCALCIFEROL, PO Take by mouth daily.   Yes Historical Provider, MD   Family History  Family History  Problem Relation Age of Onset  . Lung cancer Maternal Grandfather   . Hemochromatosis Sister   . Prostate cancer Paternal Uncle 82  . Cancer Paternal Uncle 53    unknown type of cancer    Social History  Social History   Social History  . Marital Status: Married    Spouse Name: N/A  . Number of Children: N/A  . Years of Education: N/A   Occupational History  .  Not on file.   Social History Main Topics  . Smoking status: Former Smoker -- 2.00 packs/day for 4 years    Quit date: 10/04/1985  . Smokeless tobacco: Not on file  . Alcohol Use: Yes     Comment: occ.  . Drug Use: No  . Sexual Activity: Not on file   Other Topics Concern  . Not on file   Social History Narrative     Review of Systems, as per HPI, otherwise negative General:  No chills, fever, night sweats or weight changes.  Cardiovascular:  No chest pain, dyspnea on exertion, edema, orthopnea, palpitations, paroxysmal nocturnal dyspnea. Dermatological: No rash, lesions/masses Respiratory: No cough, dyspnea Urologic: No hematuria, dysuria Abdominal:   No nausea, vomiting, diarrhea, bright red blood per rectum, melena, or hematemesis Neurologic:  No visual changes, wkns, changes in mental status. All other systems reviewed and are otherwise negative except as noted above.  Physical Exam  Blood pressure 110/80, pulse 97, height 5\' 3"  (1.6  m), weight 174 lb (78.926 kg), SpO2 96 %.  General: Pleasant, NAD Psych: Normal affect. Neuro: Alert and oriented X 3. Moves all extremities spontaneously. HEENT: Normal  Neck: Supple without bruits or JVD. Lungs:  Resp regular and unlabored, CTA. Heart: RRR no s3, s4, or murmurs. Abdomen: Soft, non-tender, non-distended, BS + x 4.  Extremities: No clubbing, cyanosis or edema. DP/PT/Radials 2+ and equal bilaterally.  Labs:  No results for input(s): CKTOTAL, CKMB, TROPONINI in the last 72 hours. Lab Results  Component Value Date   WBC 7.2 12/15/2014   HGB 13.7 12/15/2014   HCT 42.0 12/15/2014   MCV 92.2 12/15/2014   PLT 253 12/15/2014   Accessory Clinical Findings  Echocardiogram - Study Conclusions  - HPI and indications: Resting chest pain. - Stress ECG conclusions: The stress ECG was normal. - Baseline: LV global systolic function was normal. The estimated LV ejection fraction was 60%. Normal wall motion; no LV regional wall motion abnormalities. - Peak stress: LV global systolic function was vigorous. Normal wall motion; no LV regional wall motion abnormalities. - Impressions: There is normal increase in thickening and contractility in all segments. This is a normal stress echo. Impressions:  - There is normal increase in thickening and contractility in all segments. This is a normal stress echo.  ECG - normal sinus rhythm, 80 beats per minute, normal EKG.     Assessment & Plan  Number pleasant 50 year old female with prior medical history of breast cancer in remission, vitamin D deficiency, anemia, PCOS and known insulin-dependent diabetes mellitus who is coming for concerns of tachycardia and atypical chest pain.  1. Chest pain, shortness of breath, typical exertional with worsening functional capacity and increasing heart rate with minimal exertion, she had negative stress test 2 years ago, coronary CTA showed mild non-obstructive CAD, she was started on red  yeast rice and is tolerating it well.   2. Hyperlipidemia - with LDL of 133 in a diabetic person, this is very concerning is her goal should be less than 70, she didn't tolerate multiple statins, she has mild CAD, started on red yeast rice, encouraged to exercise daily.  3. Inappropriate sinus tachycardia - she didn't tolerate beta blockers, she is okay with cardizem, advised to continue and hydrate well. Ideally she would take ivabradin but not FDA approved for this indication.   Follow-up in 6 months.  Ena Dawley, MD, [facility] 06/10/2015, 8:24 AM

## 2015-06-10 NOTE — Patient Instructions (Signed)
Your physician recommends that you continue on your current medications as directed. Please refer to the Current Medication list given to you today.     Your physician wants you to follow-up in: 6 MONTHS WITH DR NELSON You will receive a reminder letter in the mail two months in advance. If you don't receive a letter, please call our office to schedule the follow-up appointment.  

## 2015-06-22 DIAGNOSIS — M47899 Other spondylosis, site unspecified: Secondary | ICD-10-CM | POA: Diagnosis not present

## 2015-06-22 DIAGNOSIS — R768 Other specified abnormal immunological findings in serum: Secondary | ICD-10-CM | POA: Diagnosis not present

## 2015-06-22 DIAGNOSIS — M25532 Pain in left wrist: Secondary | ICD-10-CM | POA: Diagnosis not present

## 2015-06-22 DIAGNOSIS — M545 Low back pain: Secondary | ICD-10-CM | POA: Diagnosis not present

## 2015-07-15 DIAGNOSIS — Z01419 Encounter for gynecological examination (general) (routine) without abnormal findings: Secondary | ICD-10-CM | POA: Diagnosis not present

## 2015-07-15 DIAGNOSIS — Z6831 Body mass index (BMI) 31.0-31.9, adult: Secondary | ICD-10-CM | POA: Diagnosis not present

## 2015-08-06 ENCOUNTER — Other Ambulatory Visit: Payer: Self-pay

## 2015-08-06 MED ORDER — DILTIAZEM HCL ER COATED BEADS 120 MG PO CP24: ORAL_CAPSULE | ORAL | 0 refills | Status: DC

## 2015-08-11 ENCOUNTER — Other Ambulatory Visit: Payer: Self-pay | Admitting: Cardiology

## 2015-08-11 MED ORDER — DILTIAZEM HCL ER COATED BEADS 120 MG PO CP24
ORAL_CAPSULE | ORAL | 3 refills | Status: DC
Start: 2015-08-11 — End: 2016-11-21

## 2015-09-24 DIAGNOSIS — M791 Myalgia: Secondary | ICD-10-CM | POA: Diagnosis not present

## 2015-09-24 DIAGNOSIS — M722 Plantar fascial fibromatosis: Secondary | ICD-10-CM | POA: Diagnosis not present

## 2015-09-24 DIAGNOSIS — G540 Brachial plexus disorders: Secondary | ICD-10-CM | POA: Diagnosis not present

## 2015-09-24 DIAGNOSIS — M9901 Segmental and somatic dysfunction of cervical region: Secondary | ICD-10-CM | POA: Diagnosis not present

## 2015-10-06 DIAGNOSIS — N83299 Other ovarian cyst, unspecified side: Secondary | ICD-10-CM | POA: Diagnosis not present

## 2015-10-06 DIAGNOSIS — R1903 Right lower quadrant abdominal swelling, mass and lump: Secondary | ICD-10-CM | POA: Diagnosis not present

## 2015-10-07 DIAGNOSIS — M791 Myalgia: Secondary | ICD-10-CM | POA: Diagnosis not present

## 2015-10-07 DIAGNOSIS — G540 Brachial plexus disorders: Secondary | ICD-10-CM | POA: Diagnosis not present

## 2015-10-07 DIAGNOSIS — M9901 Segmental and somatic dysfunction of cervical region: Secondary | ICD-10-CM | POA: Diagnosis not present

## 2015-10-07 DIAGNOSIS — M722 Plantar fascial fibromatosis: Secondary | ICD-10-CM | POA: Diagnosis not present

## 2015-11-06 ENCOUNTER — Other Ambulatory Visit: Payer: Self-pay | Admitting: Cardiology

## 2015-11-06 NOTE — Telephone Encounter (Signed)
diltiazem (CARDIZEM CD) 120 MG 24 hr capsule  Medication  Date: 08/11/2015 Department: [address] Office Ordering/Authorizing: Dorothy Spark, MD  Order Providers   Prescribing Provider Encounter Provider  Dorothy Spark, MD Dorothy Spark, MD  Medication Detail    Disp Refills Start End   diltiazem (CARDIZEM CD) 120 MG 24 hr capsule 90 capsule 3 08/11/2015    Sig: Take 1 capsule (120mg ) by mouth daily.   E-Prescribing Status: Receipt confirmed by pharmacy (08/11/2015 2:16 PM EDT)   Pharmacy   [facility], [place] - [address]

## 2015-11-11 DIAGNOSIS — Z23 Encounter for immunization: Secondary | ICD-10-CM | POA: Diagnosis not present

## 2015-11-11 DIAGNOSIS — L259 Unspecified contact dermatitis, unspecified cause: Secondary | ICD-10-CM | POA: Diagnosis not present

## 2015-12-10 DIAGNOSIS — E538 Deficiency of other specified B group vitamins: Secondary | ICD-10-CM | POA: Diagnosis not present

## 2015-12-10 DIAGNOSIS — E049 Nontoxic goiter, unspecified: Secondary | ICD-10-CM | POA: Diagnosis not present

## 2015-12-10 DIAGNOSIS — E559 Vitamin D deficiency, unspecified: Secondary | ICD-10-CM | POA: Diagnosis not present

## 2015-12-10 DIAGNOSIS — E7800 Pure hypercholesterolemia, unspecified: Secondary | ICD-10-CM | POA: Diagnosis not present

## 2015-12-10 DIAGNOSIS — R7301 Impaired fasting glucose: Secondary | ICD-10-CM | POA: Diagnosis not present

## 2015-12-14 NOTE — Assessment & Plan Note (Signed)
Primary cancer of upper outer quadrant of left female breast ([place]) 1 DCIS of the left breast- ER positive PR positive/ atypical lobular hyperplasia of right breast . Patient is now status post bilateral mastectomies with bilateral breast implants.   Surveillance: Today's breast exam is normal.  2. Celiac disease: Patient is very strictly on a gluten-free diet. Since she has been a gluten-free diet, she has not been iron deficient. She continues to be on B-12 and vitamin D supplementation.  3. 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 In fact she has been iron deficient in the past. 4. Intermittent tachycardia: Unclear etiology patient has a cardiologist. She plans to discuss this further with her cardiologist. She wonders if her shortness of breath could be related to metoprolol. I instructed her to see her primary care physician at the time when she has these symptoms.  Patient and her husband work in [employer] and travel internationally all the time. Her daughter is going to [place] help AIDS patients.  Follow-up in 1 year

## 2015-12-15 ENCOUNTER — Ambulatory Visit (HOSPITAL_BASED_OUTPATIENT_CLINIC_OR_DEPARTMENT_OTHER): Payer: BLUE CROSS/BLUE SHIELD | Admitting: Hematology and Oncology

## 2015-12-15 ENCOUNTER — Telehealth: Payer: Self-pay | Admitting: Genetic Counselor

## 2015-12-15 ENCOUNTER — Ambulatory Visit (HOSPITAL_BASED_OUTPATIENT_CLINIC_OR_DEPARTMENT_OTHER): Payer: BLUE CROSS/BLUE SHIELD

## 2015-12-15 ENCOUNTER — Encounter: Payer: Self-pay | Admitting: Hematology and Oncology

## 2015-12-15 DIAGNOSIS — D0512 Intraductal carcinoma in situ of left breast: Secondary | ICD-10-CM

## 2015-12-15 DIAGNOSIS — K900 Celiac disease: Secondary | ICD-10-CM | POA: Diagnosis not present

## 2015-12-15 DIAGNOSIS — R000 Tachycardia, unspecified: Secondary | ICD-10-CM

## 2015-12-15 DIAGNOSIS — C50412 Malignant neoplasm of upper-outer quadrant of left female breast: Secondary | ICD-10-CM

## 2015-12-15 LAB — CBC & DIFF AND RETIC
BASO%: 0.7 % (ref 0.0–2.0)
BASOS ABS: 0.1 10*3/uL (ref 0.0–0.1)
EOS ABS: 0.3 10*3/uL (ref 0.0–0.5)
EOS%: 3.3 % (ref 0.0–7.0)
HCT: 41.4 % (ref 34.8–46.6)
HEMOGLOBIN: 14 g/dL (ref 11.6–15.9)
IMMATURE RETIC FRACT: 11.2 % — AB (ref 1.60–10.00)
LYMPH#: 2.3 10*3/uL (ref 0.9–3.3)
LYMPH%: 28.5 % (ref 14.0–49.7)
MCH: 30.6 pg (ref 25.1–34.0)
MCHC: 33.8 g/dL (ref 31.5–36.0)
MCV: 90.6 fL (ref 79.5–101.0)
MONO#: 0.5 10*3/uL (ref 0.1–0.9)
MONO%: 6.1 % (ref 0.0–14.0)
NEUT#: 5 10*3/uL (ref 1.5–6.5)
NEUT%: 61.4 % (ref 38.4–76.8)
NRBC: 0 % (ref 0–0)
Platelets: 315 10*3/uL (ref 145–400)
RBC: 4.57 10*6/uL (ref 3.70–5.45)
RDW: 13.3 % (ref 11.2–14.5)
RETIC %: 1.54 % (ref 0.70–2.10)
RETIC CT ABS: 70.38 10*3/uL (ref 33.70–90.70)
WBC: 8.2 10*3/uL (ref 3.9–10.3)

## 2015-12-15 LAB — IRON AND TIBC
%SAT: 21 % (ref 21–57)
IRON: 67 ug/dL (ref 41–142)
TIBC: 315 ug/dL (ref 236–444)
UIBC: 248 ug/dL (ref 120–384)

## 2015-12-15 LAB — FERRITIN: FERRITIN: 46 ng/mL (ref 9–269)

## 2015-12-15 NOTE — Progress Notes (Signed)
Patient Care Team: Louretta Shorten, MD as PCP - General (Obstetrics and Gynecology)  DIAGNOSIS:  Encounter Diagnosis  Name Primary?  . Primary cancer of upper outer quadrant of left female breast ([place])     SUMMARY OF ONCOLOGIC HISTORY:   Primary cancer of upper outer quadrant of left female breast ([place])   10/06/2011 Procedure    genetic counseling and testing found to be negative for BRCA1 and 2 gene mutations      11/24/2011 Surgery    left mastectomy: DCIS intermediate grade 5.8 cm 3 SL and negative, ER 99%, PR 69%; right mastectomy: lobular neoplasia atypical lobular hyperplasia      11/24/2011 Surgery    immediate reconstruction with tissue expander by Dr. Harlow Mares       CHIEF COMPLIANT: Surveillance of left breast DCIS  INTERVAL HISTORY: Samantha Andersen is a 50 year old with above-mentioned history left breasts DCIS treated with bilateral mastectomy followed by immediate reconstruction. She is currently on surveillance. She is here for annual visit. She denies any lumps or nodules. She denies any health problems recently. She complains that when she laughs really hard she feels of the right sided muscle flips up and causes discomfort. She always has to massage the breast to make it better.  REVIEW OF SYSTEMS:   Constitutional: Denies fevers, chills or abnormal weight loss Eyes: Denies blurriness of vision Ears, nose, mouth, throat, and face: Denies mucositis or sore throat Respiratory: Denies cough, dyspnea or wheezes Cardiovascular: Denies palpitation, chest discomfort Gastrointestinal:  Denies nausea, heartburn or change in bowel habits Skin: Denies abnormal skin rashes Lymphatics: Denies new lymphadenopathy or easy bruising Neurological:Denies numbness, tingling or new weaknesses Behavioral/Psych: Mood is stable, no new changes  Extremities: No lower extremity edema Breast: Bilateral reconstructed breasts All other systems were reviewed with the patient and are  negative.  I have reviewed the past medical history, past surgical history, social history and family history with the patient and they are unchanged from previous note.  ALLERGIES:  is allergic to doxycycline; gluten meal; lipitor [atorvastatin]; other; tetracyclines & related; ciprofloxacin; crestor [rosuvastatin]; and metformin and related.  MEDICATIONS:  Current Outpatient Prescriptions  Medication Sig Dispense Refill  . Coenzyme Q10 (COQ10) 50 G POWD Take 100 g by mouth daily.    . Cyanocobalamin (VITAMIN B 12 PO) Take by mouth.    . diclofenac sodium (VOLTAREN) 1 % GEL Apply topically 4 (four) times daily as needed.    . diltiazem (CARDIZEM CD) 120 MG 24 hr capsule Take 1 capsule (132m) by mouth daily. 90 capsule 3  . ibuprofen (ADVIL,MOTRIN) 100 MG tablet Take 100 mg by mouth as needed.    . metFORMIN (GLUCOPHAGE-XR) 500 MG 24 hr tablet Take 500 mg by mouth daily.     . Red Yeast Rice 600 MG CAPS Take 1 capsule (600 mg total) by mouth daily. 100 capsule 3  . VITAMIN D, ERGOCALCIFEROL, PO Take 5,000 Units by mouth daily.      No current facility-administered medications for this visit.     PHYSICAL EXAMINATION: ECOG PERFORMANCE STATUS: 1 - Symptomatic but completely ambulatory  Vitals:   12/15/15 0935  BP: 119/80  Pulse: (!) 125  Resp: 19  Temp: 98.3 F (36.8 C)   Filed Weights   12/15/15 0935  Weight: 174 lb 9.6 oz (79.2 kg)    GENERAL:alert, no distress and comfortable SKIN: skin color, texture, turgor are normal, no rashes or significant lesions EYES: normal, Conjunctiva are pink and non-injected, sclera clear  OROPHARYNX:no exudate, no erythema and lips, buccal mucosa, and tongue normal  NECK: supple, thyroid normal size, non-tender, without nodularity LYMPH:  no palpable lymphadenopathy in the cervical, axillary or inguinal LUNGS: clear to auscultation and percussion with normal breathing effort HEART: regular rate & rhythm and no murmurs and no lower extremity  edema ABDOMEN:abdomen soft, non-tender and normal bowel sounds MUSCULOSKELETAL:no cyanosis of digits and no clubbing  NEURO: alert & oriented x 3 with fluent speech, no focal motor/sensory deficits EXTREMITIES: No lower extremity edema BREAST: No palpable masses or nodules in either right or left reconstructed breasts. No palpable axillary supraclavicular or infraclavicular adenopathy no breast tenderness or nipple discharge. (exam performed in the presence of a chaperone)  LABORATORY DATA:  I have reviewed the data as listed   Chemistry      Component Value Date/Time   NA 138 12/15/2014 0825   K 4.2 12/15/2014 0825   CL 103 04/04/2014 1022   CL 108 (H) 05/18/2012 1041   CO2 21 (L) 12/15/2014 0825   BUN 13.5 12/15/2014 0825   CREATININE 0.70 05/25/2015 1448   CREATININE 0.8 12/15/2014 0825      Component Value Date/Time   CALCIUM 9.2 12/15/2014 0825   ALKPHOS 53 12/15/2014 0825   AST 23 12/15/2014 0825   ALT 30 12/15/2014 0825   BILITOT 0.31 12/15/2014 0825       Lab Results  Component Value Date   WBC 7.2 12/15/2014   HGB 13.7 12/15/2014   HCT 42.0 12/15/2014   MCV 92.2 12/15/2014   PLT 253 12/15/2014   NEUTROABS 4.7 12/15/2014    ASSESSMENT & PLAN:  Primary cancer of upper outer quadrant of left female breast ([place])  1 DCIS of the left breast- ER positive PR positive/ atypical lobular hyperplasia of right breast . Patient is now status post bilateral mastectomies with bilateral breast implants.   Surveillance: Today's breast exam is normal.  2. Celiac disease: Patient is very strictly on a gluten-free diet. Since she has been a gluten-free diet, she has not been iron deficient. She continues to be on B-12 and vitamin D supplementation.  3. 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 In fact she has been iron deficient in the past. 4. Intermittent tachycardia: Unclear etiology patient has a cardiologist. She does meet a heart rate can go  up to 180 during exercise. He tells me that she had full workup including a cardiac MRI which was noncontributory. She was taking diltiazem but it has not decreased her heart rate. She wonders if it is deficiency of iron which could lead to tachycardia. We will get blood work today. I will call her with the results of the blood work.  Patient and her husband work in [employer] and travel internationally all the time. Her daughter loved going to [place] help AIDS patients.  Follow-up in 1 year with survivorship practitioner    Orders Placed This Encounter  Procedures  . CBC & Diff and Retic    Standing Status:   Future    Standing Expiration Date:   12/14/2016  . Ferritin    Standing Status:   Future    Standing Expiration Date:   12/14/2016  . Iron and TIBC    Standing Status:   Future    Standing Expiration Date:   12/14/2016   The patient has a good understanding of the overall plan. she agrees with it. she will call with any problems that may develop  before the next visit here.   Rulon Eisenmenger, MD 12/15/15

## 2015-12-15 NOTE — Telephone Encounter (Signed)
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  Per clinvar, several labs have seen this VUS and all call it a VUS.  I contacted [facility] and they reassessed it about 6 months ago and at that time there was nothing new to add.  It remains a VUS.  Discussed with patient that it is appropriate for her to call in every couple years and that we will certainly let her know as soon as we hear of its reclassification.

## 2015-12-17 DIAGNOSIS — E559 Vitamin D deficiency, unspecified: Secondary | ICD-10-CM | POA: Diagnosis not present

## 2015-12-17 DIAGNOSIS — R7301 Impaired fasting glucose: Secondary | ICD-10-CM | POA: Diagnosis not present

## 2015-12-17 DIAGNOSIS — E282 Polycystic ovarian syndrome: Secondary | ICD-10-CM | POA: Diagnosis not present

## 2015-12-17 DIAGNOSIS — E7800 Pure hypercholesterolemia, unspecified: Secondary | ICD-10-CM | POA: Diagnosis not present

## 2015-12-21 ENCOUNTER — Institutional Professional Consult (permissible substitution): Payer: Self-pay | Admitting: Internal Medicine

## 2015-12-29 DIAGNOSIS — R5383 Other fatigue: Secondary | ICD-10-CM | POA: Diagnosis not present

## 2015-12-29 DIAGNOSIS — M545 Low back pain: Secondary | ICD-10-CM | POA: Diagnosis not present

## 2015-12-29 DIAGNOSIS — M47899 Other spondylosis, site unspecified: Secondary | ICD-10-CM | POA: Diagnosis not present

## 2015-12-29 DIAGNOSIS — R768 Other specified abnormal immunological findings in serum: Secondary | ICD-10-CM | POA: Diagnosis not present

## 2015-12-29 DIAGNOSIS — K900 Celiac disease: Secondary | ICD-10-CM | POA: Diagnosis not present

## 2016-02-08 DIAGNOSIS — J069 Acute upper respiratory infection, unspecified: Secondary | ICD-10-CM | POA: Diagnosis not present

## 2016-03-16 DIAGNOSIS — E7800 Pure hypercholesterolemia, unspecified: Secondary | ICD-10-CM | POA: Diagnosis not present

## 2016-04-13 DIAGNOSIS — F40243 Fear of flying: Secondary | ICD-10-CM | POA: Diagnosis not present

## 2016-04-13 DIAGNOSIS — Z7189 Other specified counseling: Secondary | ICD-10-CM | POA: Diagnosis not present

## 2016-06-28 DIAGNOSIS — M2550 Pain in unspecified joint: Secondary | ICD-10-CM | POA: Diagnosis not present

## 2016-06-28 DIAGNOSIS — M47899 Other spondylosis, site unspecified: Secondary | ICD-10-CM | POA: Diagnosis not present

## 2016-06-28 DIAGNOSIS — M545 Low back pain: Secondary | ICD-10-CM | POA: Diagnosis not present

## 2016-06-28 DIAGNOSIS — R768 Other specified abnormal immunological findings in serum: Secondary | ICD-10-CM | POA: Diagnosis not present

## 2016-07-27 DIAGNOSIS — Z6830 Body mass index (BMI) 30.0-30.9, adult: Secondary | ICD-10-CM | POA: Diagnosis not present

## 2016-07-27 DIAGNOSIS — Z01419 Encounter for gynecological examination (general) (routine) without abnormal findings: Secondary | ICD-10-CM | POA: Diagnosis not present

## 2016-10-03 DIAGNOSIS — E538 Deficiency of other specified B group vitamins: Secondary | ICD-10-CM | POA: Diagnosis not present

## 2016-10-03 DIAGNOSIS — R202 Paresthesia of skin: Secondary | ICD-10-CM | POA: Diagnosis not present

## 2016-10-03 DIAGNOSIS — R200 Anesthesia of skin: Secondary | ICD-10-CM | POA: Diagnosis not present

## 2016-10-03 DIAGNOSIS — E559 Vitamin D deficiency, unspecified: Secondary | ICD-10-CM | POA: Diagnosis not present

## 2016-10-03 DIAGNOSIS — Z23 Encounter for immunization: Secondary | ICD-10-CM | POA: Diagnosis not present

## 2016-11-18 ENCOUNTER — Encounter: Payer: Self-pay | Admitting: Physician Assistant

## 2016-11-18 DIAGNOSIS — R000 Tachycardia, unspecified: Secondary | ICD-10-CM | POA: Insufficient documentation

## 2016-11-18 DIAGNOSIS — I4711 Inappropriate sinus tachycardia, so stated: Secondary | ICD-10-CM | POA: Insufficient documentation

## 2016-11-18 NOTE — Progress Notes (Signed)
Cardiology Office Note    Date:  11/21/2016  ID:  Samantha Andersen, DOB Jul 26, 1965, MRN [id number] PCP:  Louretta Shorten, MD  Cardiologist:  Dr. Meda Coffee   Chief Complaint: f/u tachycardia  History of Present Illness:  Samantha Andersen is a 51 y.o. female with history of breast CA, PCOS, anemia, anxiety, HLD, celiac disease, ankylosing spondylitis, inappropriate sinus tach who presents for overdue follow-up. She was historically followed by Dr. Meda Coffee for concerns of frequent tachycardias with 1 episode observed during diagnostic colonoscopy. 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 Stress echo 2015 was normal. 48-hour monitor 09/2014 showed inappropriate sinus tachycardia for which carvedilol was started. She did not tolerate this nor metoprolol so was switched to diltiazem. She has not tolerated multiple statins and has been on red yeast rice. Coronary CTA 05/2015 showed calcium score of zero, mild nonobstructive CAD (0-25% prox RCA, 0-25% prox LAD). Labs 2017 showed Hgb 14.0 (12/2015), Cr 0.7, K 4.2, LFTs (12/2014), LDL 133 (07/2014), TSH (03/2014).  She returns for follow-up today alone. She states she stopped diltiazem because it caused her flank to hurt and she gained 20lb. She has been off it for quite some time. In October she had an unusual week where she had gone to the beach and was drinking more alcohol nightly than usual (approx 1 drink a night which was more than her norm). That week she felt more shortness of breath, frequent urination, and mild edema as well as heart racing. She did not check her heart rate at that time but it resolved on its own. She is quite active and does a 5k walking several times a week along with her husband. She also does yoga and water aerobics. She has noticed she has had to go slower up hills these last few weeks, possibly increased DOE. No chest pain with exertion, however, this morning she has  noticed a rare fleeting chest discomfort that comes on abruptly for a few seconds and resolves spontaneously, traveling all over her chest. It is not significant enough for her to pay much attention to, just something she noticed. It is not worse with exertion, inspiration, palpation or meals. She exercised within the last 3 days and did not have any exertional angina. Edema resolved.   Past Medical History:  Diagnosis Date  . Anemia   . Anxiety   . Breast cancer ([place])    left  . Celiac disease   . Complication of anesthesia    tachycardic, also reports that she needed increased anesth. because she has a high threshold for medicine/sedation  . 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 Hot flashes   . Hyperlipidemia   . Inappropriate sinus tachycardia   . Joint pain   . Mild CAD    a. mild nonobstructive CAD (0-25% prox RCA, 0-25% prox LAD).   Marland Kitchen PCOS (polycystic ovarian syndrome)   . PONV (postoperative nausea and vomiting)    also reports N&V accompanies anxiety  . Spondyloarthritis     Past Surgical History:  Procedure Laterality Date  . ABDOMINAL HYSTERECTOMY    . CESAREAN SECTION     x2    Current Medications: Current Meds  Medication Sig  . Coenzyme Q10 (COQ10) 50 G POWD Take 100 g by mouth daily.  . Cyanocobalamin (VITAMIN B 12 PO) Take by mouth.  . diclofenac sodium (VOLTAREN) 1 % GEL Apply topically 4 (four) times daily as needed.  Marland Kitchen  ibuprofen (ADVIL,MOTRIN) 100 MG tablet Take 100 mg by mouth as needed.  . Red Yeast Rice 600 MG CAPS Take 1 capsule (600 mg total) by mouth daily.  Marland Kitchen VITAMIN D, ERGOCALCIFEROL, PO Take 5,000 Units by mouth daily.   . [DISCONTINUED] diltiazem (CARDIZEM CD) 120 MG 24 hr capsule Take 1 capsule (120mg ) by mouth daily.  . [DISCONTINUED] metFORMIN (GLUCOPHAGE-XR) 500 MG 24 hr tablet Take 500 mg by mouth daily.      Allergies:   Doxycycline; Gluten meal; Lipitor [atorvastatin]; Other; Tetracyclines & related; Ciprofloxacin; Crestor [rosuvastatin];  and Metformin and related   Social History   Socioeconomic History  . Marital status: Married    Spouse name: None  . Number of children: None  . Years of education: None  . Highest education level: None  Social Needs  . Financial resource strain: None  . Food insecurity - worry: None  . Food insecurity - inability: None  . Transportation needs - medical: None  . Transportation needs - non-medical: None  Occupational History  . None  Tobacco Use  . Smoking status: Former Smoker    Packs/day: 2.00    Years: 4.00    Pack years: 8.00    Last attempt to quit: 10/04/1985    Years since quitting: 31.1  . Smokeless tobacco: Never Used  Substance and Sexual Activity  . Alcohol use: Yes    Comment: occ.  . Drug use: No  . Sexual activity: None  Other Topics Concern  . None  Social History Narrative  . None     Family History:  Family History  Problem Relation Age of Onset  . Hemochromatosis Sister   . Prostate cancer Paternal Uncle 49  . Cancer Paternal Uncle 43       unknown type of cancer  . Lung cancer Maternal Grandfather   \  ROS:   Please see the history of present illness. \ All other systems are reviewed and otherwise negative.    PHYSICAL EXAM:   VS:  BP 110/80   Pulse 76   Ht 5\' 3"  (1.6 m)   Wt 166 lb 6.4 oz (75.5 kg)   SpO2 98%   BMI 29.48 kg/m   BMI: Body mass index is 29.48 kg/m. GEN: Well nourished, well developed WF, in no acute distress  HEENT: normocephalic, atraumatic Neck: no JVD, carotid bruits, or masses Cardiac: RRR; no murmurs, rubs, or gallops, no edema  Respiratory:  clear to auscultation bilaterally, normal work of breathing GI: soft, nontender, nondistended, + BS MS: no deformity or atrophy  Skin: warm and dry, no rash Neuro:  Alert and Oriented x 3, Strength and sensation are intact, follows commands Psych: euthymic mood, full affect  Wt Readings from Last 3 Encounters:  11/21/16 166 lb 6.4 oz (75.5 kg)  12/15/15 174 lb 9.6  oz (79.2 kg)  06/10/15 174 lb (78.9 kg)      Studies/Labs Reviewed:   EKG:  EKG was ordered today and personally reviewed by me and demonstrates NSR 76bpm, no acute ST-T changes.  Recent Labs: 12/15/2015: HGB 14.0; Platelets 315   Lipid Panel    Component Value Date/Time   CHOL 210 (H) 07/28/2014 0844   CHOL 233 (H) 04/04/2014 1022   TRIG 123.0 07/28/2014 0844   TRIG 118 04/04/2014 1022   HDL 52.10 07/28/2014 0844   HDL 64 04/04/2014 1022   CHOLHDL 4 07/28/2014 0844   VLDL 24.6 07/28/2014 0844   LDLCALC 133 (H) 07/28/2014 0258  [place] 145 (H) 04/04/2014 1022    Additional studies/ records that were reviewed today include: Summarized above    ASSESSMENT & PLAN:   1. Atypical chest pain, also with recent mild dyspnea on exertion - mostly atypical features, EKG reassuring. Recent cardiac CT within last 2 years (05/2015) was reassuring as well. Not tachycardic, tachypneic or hypoxic. Will arrange ETT to assess not only for ischemic changes but also evaluate HR response to exercise. Warning sx reviewed. Will also update labs including BMET, CBC, TSH as well as BNP given perceived DOE as well as recent reports of edema (edema not present on exam today). 2. Inappropriate sinus tach - maintaining NSR today. ETT as above to assess HR response to exercise. She did not previously tolerate beta blockers. She stopped diltiazem as above due to adverse side effects. I wonder if this was a systemic response at the time to all of the autoimmune issues she was experiencing. Check labs as above. Also discussed Jodelle Red as an option for home monitoring of palpitations. 3. Hyperlipidemia - per patient, lipids are followed by endocrinology. Has been intolerant of multiple statins and thus using red yeast rice. 4. Nonobstructive CAD - insignificant by CTA 05/2015. Will update ETT as above.  Disposition: F/u with Dr. Meda Coffee in 1 year.   Medication Adjustments/Labs and Tests Ordered: Current medicines  are reviewed at length with the patient today.  Concerns regarding medicines are outlined above. Medication changes, Labs and Tests ordered today are summarized above and listed in the Patient Instructions accessible in Encounters.   Signed, Charlie Pitter, PA-C  11/21/2016 9:27 AM    [address], [place], [place]  [address] Phone: [phone]; Fax: [phone]

## 2016-11-21 ENCOUNTER — Encounter: Payer: Self-pay | Admitting: Physician Assistant

## 2016-11-21 ENCOUNTER — Ambulatory Visit: Payer: BLUE CROSS/BLUE SHIELD | Admitting: Physician Assistant

## 2016-11-21 VITALS — BP 110/80 | HR 76 | Ht 63.0 in | Wt 166.4 lb

## 2016-11-21 DIAGNOSIS — E785 Hyperlipidemia, unspecified: Secondary | ICD-10-CM

## 2016-11-21 DIAGNOSIS — R0789 Other chest pain: Secondary | ICD-10-CM

## 2016-11-21 DIAGNOSIS — R000 Tachycardia, unspecified: Secondary | ICD-10-CM | POA: Diagnosis not present

## 2016-11-21 DIAGNOSIS — R0602 Shortness of breath: Secondary | ICD-10-CM | POA: Diagnosis not present

## 2016-11-21 DIAGNOSIS — I2510 Atherosclerotic heart disease of native coronary artery without angina pectoris: Secondary | ICD-10-CM

## 2016-11-21 LAB — BASIC METABOLIC PANEL
BUN/Creatinine Ratio: 18 (ref 9–23)
BUN: 14 mg/dL (ref 6–24)
CO2: 22 mmol/L (ref 20–29)
Calcium: 10.1 mg/dL (ref 8.7–10.2)
Chloride: 101 mmol/L (ref 96–106)
Creatinine, Ser: 0.77 mg/dL (ref 0.57–1.00)
GFR calc Af Amer: 102 mL/min/{1.73_m2} (ref >59)
GFR calc non Af Amer: 88 mL/min/{1.73_m2} (ref >59)
GLUCOSE: 87 mg/dL (ref 65–99)
POTASSIUM: 4.5 mmol/L (ref 3.5–5.2)
SODIUM: 140 mmol/L (ref 134–144)

## 2016-11-21 LAB — CBC
HEMATOCRIT: 41.1 % (ref 34.0–46.6)
Hemoglobin: 14 g/dL (ref 11.1–15.9)
MCH: 31 pg (ref 26.6–33.0)
MCHC: 34.1 g/dL (ref 31.5–35.7)
MCV: 91 fL (ref 79–97)
Platelets: 306 10*3/uL (ref 150–379)
RBC: 4.51 x10E6/uL (ref 3.77–5.28)
RDW: 13.5 % (ref 12.3–15.4)
WBC: 6.9 10*3/uL (ref 3.4–10.8)

## 2016-11-21 LAB — TSH: TSH: 2.12 u[IU]/mL (ref 0.450–4.500)

## 2016-11-21 LAB — PRO B NATRIURETIC PEPTIDE: NT-Pro BNP: 26 pg/mL (ref 0–249)

## 2016-11-21 NOTE — Patient Instructions (Addendum)
Medication Instructions:  Your physician recommends that you continue on your current medications as directed. Please refer to the Current Medication list given to you today.   Labwork: TODAY:  BMET, CBC, TSH, & PRO BNP  Testing/Procedures: Your physician has requested that you have an exercise tolerance test. For further information please visit [email or web address]. Please also follow instruction sheet, as given.   Follow-Up: Your physician wants you to follow-up in: [place] will receive a reminder letter in the mail two months in advance. If you don't receive a letter, please call our office to schedule the follow-up appointment.    Any Other Special Instructions Will Be Listed Below (If Applicable).    Exercise Stress Electrocardiogram An exercise stress electrocardiogram is a test to check how blood flows to your heart. It is done to find areas of poor blood flow. You will need to walk on a treadmill for this test. The electrocardiogram will record your heartbeat when you are at rest and when you are exercising. What happens before the procedure?  Do not have drinks with caffeine or foods with caffeine for 24 hours before the test, or as told by your doctor. This includes coffee, tea (even decaf tea), sodas, chocolate, and cocoa.  Follow your doctor's instructions about eating and drinking before the test.  Ask your doctor what medicines you should or should not take before the test. Take your medicines with water unless told by your doctor not to.  If you use an inhaler, bring it with you to the test.  Bring a snack to eat after the test.  Do not  smoke for 4 hours before the test.  Do not put lotions, powders, creams, or oils on your chest before the test.  Wear comfortable shoes and clothing. What happens during the procedure?  You will have patches put on your chest. Small areas of your chest may need to be shaved. Wires will be connected to the  patches.  Your heart rate will be watched while you are resting and while you are exercising.  You will walk on the treadmill. The treadmill will slowly get faster to raise your heart rate.  The test will take about 1-2 hours. What happens after the procedure?  Your heart rate and blood pressure will be watched after the test.  You may return to your normal diet, activities, and medicines or as told by your doctor. This information is not intended to replace advice given to you by your health care provider. Make sure you discuss any questions you have with your health care provider. Document Released: 06/15/2007 Document Revised: 08/26/2015 Document Reviewed: 09/03/2012 Elsevier Interactive Patient Education  [employer].   If you need a refill on your cardiac medications before your next appointment, please call your pharmacy.

## 2016-12-06 DIAGNOSIS — R7301 Impaired fasting glucose: Secondary | ICD-10-CM | POA: Diagnosis not present

## 2016-12-06 DIAGNOSIS — E7800 Pure hypercholesterolemia, unspecified: Secondary | ICD-10-CM | POA: Diagnosis not present

## 2016-12-06 DIAGNOSIS — E559 Vitamin D deficiency, unspecified: Secondary | ICD-10-CM | POA: Diagnosis not present

## 2016-12-06 DIAGNOSIS — E049 Nontoxic goiter, unspecified: Secondary | ICD-10-CM | POA: Diagnosis not present

## 2016-12-06 DIAGNOSIS — E538 Deficiency of other specified B group vitamins: Secondary | ICD-10-CM | POA: Diagnosis not present

## 2016-12-13 DIAGNOSIS — K900 Celiac disease: Secondary | ICD-10-CM | POA: Diagnosis not present

## 2016-12-13 DIAGNOSIS — E049 Nontoxic goiter, unspecified: Secondary | ICD-10-CM | POA: Diagnosis not present

## 2016-12-13 DIAGNOSIS — R7301 Impaired fasting glucose: Secondary | ICD-10-CM | POA: Diagnosis not present

## 2016-12-13 DIAGNOSIS — E559 Vitamin D deficiency, unspecified: Secondary | ICD-10-CM | POA: Diagnosis not present

## 2016-12-13 DIAGNOSIS — R109 Unspecified abdominal pain: Secondary | ICD-10-CM | POA: Diagnosis not present

## 2016-12-14 NOTE — Progress Notes (Signed)
CLINIC:  Survivorship   REASON FOR VISIT:  Routine follow-up for history of breast cancer.   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 Harlow Mares        INTERVAL HISTORY:  Ms. Gibby presents to the [facility] today for routine follow-up for her history of breast cancer.  Overall, she reports feeling quite well. Athalie is doing well today. She denies any issues from a breast cancer perspective.  She wants me to look into her VUS. She has a spot on her ovary that Dr. Corinna Capra is following, and she wants to make sure that her VUS didn't upgrade to a pathogenic mutation.  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  She was not happy with the nutrition appointment she had here a couple of years ago, and wants my opinion on dietary options.      REVIEW OF SYSTEMS:  Review of Systems  Constitutional: Negative for appetite change, chills, fatigue, fever and unexpected weight change.  HENT:   Negative for hearing loss, lump/mass, sore throat and trouble swallowing.   Eyes: Negative for eye problems.  Respiratory: Negative for chest tightness, cough and shortness of breath.   Cardiovascular: Negative for chest pain and leg swelling.  Gastrointestinal: Positive for abdominal pain (RUQ intermittent over past couple of months). Negative for abdominal distention, diarrhea, nausea and vomiting.  Endocrine: Negative for hot flashes.  Genitourinary: Negative for difficulty  urinating.   Musculoskeletal: Negative for arthralgias and back pain.  Skin: Negative for itching and rash.  Neurological: Negative for dizziness and headaches.  Hematological: Negative for adenopathy. Does not bruise/bleed easily.  Psychiatric/Behavioral: Negative for depression. The patient is not nervous/anxious.   Breast: Denies any new nodularity, masses, tenderness, nipple changes, or nipple discharge.       PAST MEDICAL/SURGICAL HISTORY:  Past Medical History:  Diagnosis Date  . Anemia   . Anxiety   . Breast cancer ([place])    left  . Celiac disease   . Complication of anesthesia    tachycardic, also reports that she needed increased anesth. because she has a high threshold for medicine/sedation  . 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 Hot flashes   . Hyperlipidemia   . Inappropriate sinus tachycardia   . Joint pain   . Mild CAD    a. mild nonobstructive CAD (0-25% prox RCA, 0-25% prox LAD).   Marland Kitchen PCOS (polycystic ovarian syndrome)   . PONV (postoperative nausea and vomiting)    also reports N&V accompanies anxiety  . Spondyloarthritis    Past Surgical History:  Procedure Laterality Date  . ABDOMINAL HYSTERECTOMY    . CESAREAN SECTION     x2  . SIMPLE MASTECTOMY WITH AXILLARY SENTINEL NODE BIOPSY  11/24/2011   Procedure: SIMPLE MASTECTOMY WITH AXILLARY SENTINEL NODE BIOPSY;  Surgeon: Haywood Lasso, MD;  Location: [facility];  Service: General;  Laterality: Left;  Bilateral Total Mastectomy and Left Sentinel Node  .  SIMPLE MASTECTOMY WITH AXILLARY SENTINEL NODE BIOPSY  11/24/2011   Procedure: SIMPLE MASTECTOMY;  Surgeon: Haywood Lasso, MD;  Location: [facility];  Service: General;  Laterality: Right;  . TISSUE EXPANDER PLACEMENT  11/24/2011   Procedure: TISSUE EXPANDER;  Surgeon: Crissie Reese, MD;  Location: [facility];  Service: Plastics;  Laterality: Bilateral;     ALLERGIES:  Allergies  Allergen Reactions  . Doxycycline Shortness Of Breath  . Gluten Meal Shortness Of  Breath  . Lipitor [Atorvastatin] Other (See Comments)  . Other Shortness Of Breath    SQUASH  . Tetracyclines & Related Shortness Of Breath and Other (See Comments)    Lethargy   . Ciprofloxacin     unknown  . Crestor [Rosuvastatin] Other (See Comments)    Muscle aches and muscle fatigue  . Metformin And Related     Patient prefers to take Tevo brand metformin XR due to sensitivity rection to other brands. Patient is allergic to gluten and must have gluten free Metformin     CURRENT MEDICATIONS:  Outpatient Encounter Medications as of 12/15/2016  Medication Sig  . Coenzyme Q10 (COQ10) 50 G POWD Take 100 g by mouth daily.  . Cyanocobalamin (VITAMIN B 12 PO) Take by mouth.  . diclofenac sodium (VOLTAREN) 1 % GEL Apply topically 4 (four) times daily as needed.  Marland Kitchen VITAMIN D, ERGOCALCIFEROL, PO Take 5,000 Units by mouth daily.   . [DISCONTINUED] ibuprofen (ADVIL,MOTRIN) 100 MG tablet Take 100 mg by mouth as needed.  . [DISCONTINUED] Red Yeast Rice 600 MG CAPS Take 1 capsule (600 mg total) by mouth daily. (Patient not taking: Reported on 12/15/2016)   No facility-administered encounter medications on file as of 12/15/2016.      ONCOLOGIC FAMILY HISTORY:  Family History  Problem Relation Age of Onset  . Hemochromatosis Sister   . Prostate cancer Paternal Uncle 27  . Cancer Paternal Uncle 20       unknown type of cancer  . Lung cancer Maternal Grandfather     GENETIC COUNSELING/TESTING: BRCA 1 and 2 negative, VUS? Awaiting results from [facility] HISTORY:  ZENAB GRONEWOLD is married and lives with her husband in [place], [place].  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.  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  She denies any current or history of tobacco, alcohol, or illicit drug use.     PHYSICAL EXAMINATION:  Vital Signs: Vitals:   12/15/16 1450  BP: 108/78    Pulse: (!) 108  Resp: 16  Temp: 98.7 F (37.1 C)  SpO2: 96%   Filed Weights   12/15/16 1450  Weight: 167 lb 14.4 oz (76.2 kg)   General: Well-nourished, well-appearing female in no acute distress.  Unaccompanied today.   HEENT: Head is normocephalic.  Pupils equal and reactive to light. Conjunctivae clear without exudate.  Sclerae anicteric. Oral mucosa is pink, moist.  Oropharynx is pink without lesions or erythema.  Lymph: No cervical, supraclavicular, or infraclavicular lymphadenopathy noted on palpation.  Cardiovascular: Regular rate and rhythm.Marland Kitchen Respiratory: Clear to auscultation bilaterally. Chest expansion symmetric; breathing non-labored.  Breast Exam:  S/p bilateral mastectomy with implant placement, no nodules, masses, or abnormalities noted. -Axilla: No axillary adenopathy bilaterally.  GI: Abdomen soft and round; non-tender, non-distended. Bowel sounds normoactive. No hepatosplenomegaly.   GU: Deferred.  Neuro: No focal deficits. Steady gait.  Psych: Mood and affect normal and appropriate for  situation.  MSK: No focal spinal tenderness to palpation, full range of motion in bilateral upper extremities Extremities: No edema. Skin: Warm and dry.  LABORATORY DATA:  Appointment on 12/15/2016  Component Date Value Ref Range Status  . Sodium 12/15/2016 139  136 - 145 mEq/L Final  . Potassium 12/15/2016 4.1  3.5 - 5.1 mEq/L Final  . Chloride 12/15/2016 105  98 - 109 mEq/L Final  . CO2 12/15/2016 22  22 - 29 mEq/L Final  . Glucose 12/15/2016 109  70 - 140 mg/dl Final   Glucose reference range is for nonfasting patients. Fasting glucose reference range is 70- 100.  Marland Kitchen BUN 12/15/2016 20.5  7.0 - 26.0 mg/dL Final  . Creatinine 12/15/2016 0.8  0.6 - 1.1 mg/dL Final  . Total Bilirubin 12/15/2016 0.24  0.20 - 1.20 mg/dL Final  . Alkaline Phosphatase 12/15/2016 60  40 - 150 U/L Final  . AST 12/15/2016 24  5 - 34 U/L Final  . ALT 12/15/2016 29  0 - 55 U/L Final  . Total Protein  12/15/2016 7.1  6.4 - 8.3 g/dL Final  . Albumin 12/15/2016 3.8  3.5 - 5.0 g/dL Final  . Calcium 12/15/2016 9.1  8.4 - 10.4 mg/dL Final  . Anion Gap 12/15/2016 12* 3 - 11 mEq/L Final  . EGFR 12/15/2016 >60  >60 ml/min/1.73 m2 Final   eGFR is calculated using the CKD-EPI Creatinine Equation (2009)  . WBC 12/15/2016 7.4  3.9 - 10.3 10e3/uL Final  . NEUT# 12/15/2016 4.0  1.5 - 6.5 10e3/uL Final  . HGB 12/15/2016 13.3  11.6 - 15.9 g/dL Final  . HCT 12/15/2016 40.2  34.8 - 46.6 % Final  . Platelets 12/15/2016 275  145 - 400 10e3/uL Final  . MCV 12/15/2016 93.5  79.5 - 101.0 fL Final  . MCH 12/15/2016 30.9  25.1 - 34.0 pg Final  . MCHC 12/15/2016 33.1  31.5 - 36.0 g/dL Final  . RBC 12/15/2016 4.30  3.70 - 5.45 10e6/uL Final  . RDW 12/15/2016 13.1  11.2 - 14.5 % Final  . lymph# 12/15/2016 2.6  0.9 - 3.3 10e3/uL Final  . MONO# 12/15/2016 0.4  0.1 - 0.9 10e3/uL Final  . Eosinophils Absolute 12/15/2016 0.3  0.0 - 0.5 10e3/uL Final  . Basophils Absolute 12/15/2016 0.1  0.0 - 0.1 10e3/uL Final  . NEUT% 12/15/2016 54.6  38.4 - 76.8 % Final  . LYMPH% 12/15/2016 35.1  14.0 - 49.7 % Final  . MONO% 12/15/2016 5.3  0.0 - 14.0 % Final  . EOS% 12/15/2016 4.2  0.0 - 7.0 % Final  . BASO% 12/15/2016 0.8  0.0 - 2.0 % Final     DIAGNOSTIC IMAGING:  Most recent mammogram: n/a s/p bilateral mastectomies    ASSESSMENT AND PLAN:  Ms.. Leavelle is a pleasant 50 y.o. female with history of Stage 0 left breast DCIS, ER+/PR+, diagnosed in 09/2011, treated with bilateral mastectomies.  [facility] and routine follow-up.   1. History of breast cancer:  Ms. Mcbroom is currently clinically and radiographically without evidence of disease or recurrence of breast cancer.  I encouraged her to call me with any questions or concerns before her next visit at the cancer center, and I would be happy to see her sooner, if needed.    2. Co morbidities/iron deficiency: We will get  some labs today to evaluate her iron levels, vitamin d level, CBC, CMET.  3. Dietary choices: I reviewed with her in  detail the type of diet she should be eating including proteins, vegetables, and fruits, but limiting carbs, simple sugars, processed foods, and red meats.    4.  RUQ discomfort: Will get an ultrasound to evaluate.  ? Gall bladder.    5. Bone health:  Given Ms. Wyre's age, history of breast cancer, she is at risk for bone demineralization.  She was given education on specific food and activities to promote bone health.  6. Cancer screening:  Due to Ms. Alcocer's history and her age, she should receive screening for skin cancers, colon cancer, and gynecologic cancers. She was encouraged to follow-up with her PCP for appropriate cancer screenings.   7. Health maintenance and wellness promotion: Ms. Deveney was encouraged to consume 5-7 servings of fruits and vegetables per day. She was also encouraged to engage in moderate to vigorous exercise for 30 minutes per day most days of the week. She was instructed to limit her alcohol consumption and continue to abstain from tobacco use.     Dispo:  -Return to cancer center in one year for LTS follow up   A total of (30) minutes of face-to-face time was spent with this patient with greater than 50% of that time in counseling and care-coordination.   Gardenia Phlegm, [place] [phone]   Note: PRIMARY CARE PROVIDER Louretta Shorten, [place] [phone]

## 2016-12-15 ENCOUNTER — Ambulatory Visit (HOSPITAL_BASED_OUTPATIENT_CLINIC_OR_DEPARTMENT_OTHER): Payer: BLUE CROSS/BLUE SHIELD | Admitting: Adult Health

## 2016-12-15 ENCOUNTER — Encounter: Payer: Self-pay | Admitting: Adult Health

## 2016-12-15 ENCOUNTER — Telehealth: Payer: Self-pay | Admitting: Adult Health

## 2016-12-15 ENCOUNTER — Ambulatory Visit (HOSPITAL_BASED_OUTPATIENT_CLINIC_OR_DEPARTMENT_OTHER): Payer: BLUE CROSS/BLUE SHIELD

## 2016-12-15 VITALS — BP 108/78 | HR 108 | Temp 98.7°F | Resp 16 | Ht 63.0 in | Wt 167.9 lb

## 2016-12-15 DIAGNOSIS — Z86000 Personal history of in-situ neoplasm of breast: Secondary | ICD-10-CM

## 2016-12-15 DIAGNOSIS — E611 Iron deficiency: Secondary | ICD-10-CM

## 2016-12-15 DIAGNOSIS — D508 Other iron deficiency anemias: Secondary | ICD-10-CM

## 2016-12-15 DIAGNOSIS — C50412 Malignant neoplasm of upper-outer quadrant of left female breast: Secondary | ICD-10-CM

## 2016-12-15 DIAGNOSIS — E559 Vitamin D deficiency, unspecified: Secondary | ICD-10-CM | POA: Diagnosis not present

## 2016-12-15 DIAGNOSIS — R1011 Right upper quadrant pain: Secondary | ICD-10-CM

## 2016-12-15 LAB — CBC WITH DIFFERENTIAL/PLATELET
BASO%: 0.8 % (ref 0.0–2.0)
BASOS ABS: 0.1 10*3/uL (ref 0.0–0.1)
EOS ABS: 0.3 10*3/uL (ref 0.0–0.5)
EOS%: 4.2 % (ref 0.0–7.0)
HCT: 40.2 % (ref 34.8–46.6)
HEMOGLOBIN: 13.3 g/dL (ref 11.6–15.9)
LYMPH%: 35.1 % (ref 14.0–49.7)
MCH: 30.9 pg (ref 25.1–34.0)
MCHC: 33.1 g/dL (ref 31.5–36.0)
MCV: 93.5 fL (ref 79.5–101.0)
MONO#: 0.4 10*3/uL (ref 0.1–0.9)
MONO%: 5.3 % (ref 0.0–14.0)
NEUT#: 4 10*3/uL (ref 1.5–6.5)
NEUT%: 54.6 % (ref 38.4–76.8)
Platelets: 275 10*3/uL (ref 145–400)
RBC: 4.3 10*6/uL (ref 3.70–5.45)
RDW: 13.1 % (ref 11.2–14.5)
WBC: 7.4 10*3/uL (ref 3.9–10.3)
lymph#: 2.6 10*3/uL (ref 0.9–3.3)

## 2016-12-15 LAB — COMPREHENSIVE METABOLIC PANEL
ALBUMIN: 3.8 g/dL (ref 3.5–5.0)
ALK PHOS: 60 U/L (ref 40–150)
ALT: 29 U/L (ref 0–55)
ANION GAP: 12 meq/L — AB (ref 3–11)
AST: 24 U/L (ref 5–34)
BILIRUBIN TOTAL: 0.24 mg/dL (ref 0.20–1.20)
BUN: 20.5 mg/dL (ref 7.0–26.0)
CALCIUM: 9.1 mg/dL (ref 8.4–10.4)
CO2: 22 mEq/L (ref 22–29)
CREATININE: 0.8 mg/dL (ref 0.6–1.1)
Chloride: 105 mEq/L (ref 98–109)
EGFR: >60 mL/min/{1.73_m2} (ref >60)
Glucose: 109 mg/dl (ref 70–140)
Potassium: 4.1 mEq/L (ref 3.5–5.1)
Sodium: 139 mEq/L (ref 136–145)
TOTAL PROTEIN: 7.1 g/dL (ref 6.4–8.3)

## 2016-12-15 NOTE — Telephone Encounter (Signed)
Gave patient AVS and calendar of upcoming December 2019 appointments.  °

## 2016-12-16 LAB — VITAMIN D 25 HYDROXY (VIT D DEFICIENCY, FRACTURES): VIT D 25 HYDROXY: 30.7 ng/mL (ref 30.0–100.0)

## 2016-12-16 LAB — IRON AND TIBC
%SAT: 23 % (ref 21–57)
IRON: 69 ug/dL (ref 41–142)
TIBC: 296 ug/dL (ref 236–444)
UIBC: 227 ug/dL (ref 120–384)

## 2016-12-16 LAB — FERRITIN: Ferritin: 42 ng/ml (ref 9–269)

## 2016-12-20 ENCOUNTER — Ambulatory Visit (HOSPITAL_COMMUNITY)
Admission: RE | Admit: 2016-12-20 | Discharge: 2016-12-20 | Disposition: A | Discharge status: Home / Self Care | Payer: BLUE CROSS/BLUE SHIELD | Source: Ambulatory Visit | Attending: Adult Health | Admitting: Adult Health

## 2016-12-20 DIAGNOSIS — R1011 Right upper quadrant pain: Secondary | ICD-10-CM | POA: Diagnosis not present

## 2016-12-25 ENCOUNTER — Encounter: Payer: Self-pay | Admitting: Adult Health

## 2016-12-26 ENCOUNTER — Telehealth: Payer: Self-pay

## 2016-12-26 NOTE — Telephone Encounter (Signed)
Spoke with pt to let her know that labs were normal but vit D was low normal.  Pt stated that she takes vit D 4000 units every day.  LPN Encouraged pt to continue taking.  Also let pt know that her [place] results were normal. Patient voiced understanding and had no questions at this time.

## 2016-12-26 NOTE — Telephone Encounter (Signed)
-----   Message from Gardenia Phlegm, NP sent at 12/26/2016  1:18 PM EST ----- Please call patient with her lab results.  Her vitamin d is low normal.  Is she taking any? ----- Message ----- From: Interface, Lab In Three Zero One Sent: 12/15/2016   4:17 PM To: Gardenia Phlegm, NP

## 2016-12-26 NOTE — Telephone Encounter (Signed)
-----   Message from Gardenia Phlegm, NP sent at 12/26/2016  1:16 PM EST -----  Will you call patient with results, normal ----- Message ----- From: Interface, Rad Results In Sent: 12/20/2016  10:17 AM To: Gardenia Phlegm, NP

## 2016-12-26 NOTE — Telephone Encounter (Signed)
Pt made aware of [place] results through previous call.

## 2017-01-19 ENCOUNTER — Ambulatory Visit (INDEPENDENT_AMBULATORY_CARE_PROVIDER_SITE_OTHER): Payer: BLUE CROSS/BLUE SHIELD

## 2017-01-19 DIAGNOSIS — R000 Tachycardia, unspecified: Secondary | ICD-10-CM

## 2017-01-19 DIAGNOSIS — R0789 Other chest pain: Secondary | ICD-10-CM

## 2017-01-19 LAB — EXERCISE TOLERANCE TEST
CSEPED: 9 min
CSEPEW: 10.1 METS
CSEPHR: 99 %
Exercise duration (sec): 0 s
MPHR: 167 {beats}/min
Peak HR: 166 {beats}/min
RPE: 17
Rest HR: 85 {beats}/min

## 2017-01-20 ENCOUNTER — Telehealth: Payer: Self-pay

## 2017-01-20 NOTE — Telephone Encounter (Signed)
lmtcb for recent testing results.

## 2017-01-20 NOTE — Telephone Encounter (Signed)
-----   Message from Charlie Pitter, [place] sent at 01/20/2017  5:59 AM EST ----- Please let patient know stress test was totally normal. Melina Copa PA-C

## 2017-01-25 ENCOUNTER — Telehealth: Payer: Self-pay | Admitting: Physician Assistant

## 2017-01-25 NOTE — Telephone Encounter (Signed)
New Message   Patient is returning call for lab test results. Please call.

## 2017-01-25 NOTE — Telephone Encounter (Signed)
Returned pts call and she has been made aware of her stress test results. See result note.

## 2017-01-25 NOTE — Telephone Encounter (Signed)
-----   Message from Charlie Pitter, [place] sent at 01/20/2017  5:59 AM EST ----- Please let patient know stress test was totally normal. Melina Copa PA-C

## 2017-02-08 DIAGNOSIS — M47899 Other spondylosis, site unspecified: Secondary | ICD-10-CM | POA: Diagnosis not present

## 2017-02-08 DIAGNOSIS — K900 Celiac disease: Secondary | ICD-10-CM | POA: Diagnosis not present

## 2017-03-13 DIAGNOSIS — M25512 Pain in left shoulder: Secondary | ICD-10-CM | POA: Diagnosis not present

## 2017-03-13 DIAGNOSIS — M7532 Calcific tendinitis of left shoulder: Secondary | ICD-10-CM | POA: Diagnosis not present

## 2017-03-18 ENCOUNTER — Encounter: Payer: Self-pay | Admitting: Adult Health

## 2017-03-27 DIAGNOSIS — M25512 Pain in left shoulder: Secondary | ICD-10-CM | POA: Diagnosis not present

## 2017-03-27 DIAGNOSIS — M25612 Stiffness of left shoulder, not elsewhere classified: Secondary | ICD-10-CM | POA: Diagnosis not present

## 2017-03-28 ENCOUNTER — Other Ambulatory Visit: Payer: Self-pay | Admitting: Endocrinology

## 2017-03-28 DIAGNOSIS — R609 Edema, unspecified: Secondary | ICD-10-CM

## 2017-03-28 DIAGNOSIS — R52 Pain, unspecified: Secondary | ICD-10-CM

## 2017-03-28 DIAGNOSIS — E049 Nontoxic goiter, unspecified: Secondary | ICD-10-CM

## 2017-03-30 ENCOUNTER — Ambulatory Visit
Admission: RE | Admit: 2017-03-30 | Discharge: 2017-03-30 | Disposition: A | Discharge status: Home / Self Care | Payer: BLUE CROSS/BLUE SHIELD | Source: Ambulatory Visit | Attending: Endocrinology | Admitting: Endocrinology

## 2017-03-30 DIAGNOSIS — E041 Nontoxic single thyroid nodule: Secondary | ICD-10-CM | POA: Diagnosis not present

## 2017-03-30 DIAGNOSIS — R609 Edema, unspecified: Secondary | ICD-10-CM

## 2017-03-30 DIAGNOSIS — M25512 Pain in left shoulder: Secondary | ICD-10-CM | POA: Diagnosis not present

## 2017-03-30 DIAGNOSIS — R52 Pain, unspecified: Secondary | ICD-10-CM

## 2017-03-30 DIAGNOSIS — M25612 Stiffness of left shoulder, not elsewhere classified: Secondary | ICD-10-CM | POA: Diagnosis not present

## 2017-03-30 DIAGNOSIS — E049 Nontoxic goiter, unspecified: Secondary | ICD-10-CM

## 2017-04-03 DIAGNOSIS — M25512 Pain in left shoulder: Secondary | ICD-10-CM | POA: Diagnosis not present

## 2017-04-03 DIAGNOSIS — M25612 Stiffness of left shoulder, not elsewhere classified: Secondary | ICD-10-CM | POA: Diagnosis not present

## 2017-04-07 DIAGNOSIS — I471 Supraventricular tachycardia: Secondary | ICD-10-CM | POA: Diagnosis not present

## 2017-04-07 DIAGNOSIS — E069 Thyroiditis, unspecified: Secondary | ICD-10-CM | POA: Diagnosis not present

## 2017-04-07 DIAGNOSIS — R51 Headache: Secondary | ICD-10-CM | POA: Diagnosis not present

## 2017-04-10 DIAGNOSIS — M25612 Stiffness of left shoulder, not elsewhere classified: Secondary | ICD-10-CM | POA: Diagnosis not present

## 2017-04-10 DIAGNOSIS — M25512 Pain in left shoulder: Secondary | ICD-10-CM | POA: Diagnosis not present

## 2017-04-12 DIAGNOSIS — M25512 Pain in left shoulder: Secondary | ICD-10-CM | POA: Diagnosis not present

## 2017-04-12 DIAGNOSIS — M7532 Calcific tendinitis of left shoulder: Secondary | ICD-10-CM | POA: Diagnosis not present

## 2017-04-25 DIAGNOSIS — H16223 Keratoconjunctivitis sicca, not specified as Sjogren's, bilateral: Secondary | ICD-10-CM | POA: Diagnosis not present

## 2017-05-10 DIAGNOSIS — M47899 Other spondylosis, site unspecified: Secondary | ICD-10-CM | POA: Diagnosis not present

## 2017-05-10 DIAGNOSIS — K900 Celiac disease: Secondary | ICD-10-CM | POA: Diagnosis not present

## 2017-05-10 DIAGNOSIS — M2550 Pain in unspecified joint: Secondary | ICD-10-CM | POA: Diagnosis not present

## 2017-05-25 ENCOUNTER — Encounter: Payer: Self-pay | Admitting: Adult Health

## 2017-06-27 ENCOUNTER — Telehealth: Payer: Self-pay

## 2017-06-27 NOTE — Telephone Encounter (Signed)
Received vm from pt requesting to see Dr.Gudena or Mendel Ryder to be evaluated for lymph node swelling and pain that she's been experiencing for a month. Pt would like to be assessed prior to further testing. Attempted to call pt back with no answer. LVM with call back number. Sent message to scheduling to set up an appt for pt to see MD or NP this week or next week.

## 2017-06-28 ENCOUNTER — Telehealth: Payer: Self-pay | Admitting: Hematology and Oncology

## 2017-06-28 NOTE — Telephone Encounter (Signed)
Called pt re appts that were added per 6/18 sch msg - left vm for pt re appts.

## 2017-07-03 ENCOUNTER — Inpatient Hospital Stay: Payer: BLUE CROSS/BLUE SHIELD | Attending: Hematology and Oncology | Admitting: Hematology and Oncology

## 2017-07-03 ENCOUNTER — Telehealth: Payer: Self-pay | Admitting: Hematology and Oncology

## 2017-07-03 VITALS — BP 109/76 | HR 108 | Temp 98.4°F | Resp 19 | Ht 63.0 in | Wt 173.1 lb

## 2017-07-03 DIAGNOSIS — C50412 Malignant neoplasm of upper-outer quadrant of left female breast: Secondary | ICD-10-CM | POA: Insufficient documentation

## 2017-07-03 DIAGNOSIS — Z79899 Other long term (current) drug therapy: Secondary | ICD-10-CM | POA: Insufficient documentation

## 2017-07-03 DIAGNOSIS — Z170 Estrogen receptor positive status [ER+]: Secondary | ICD-10-CM | POA: Diagnosis not present

## 2017-07-03 DIAGNOSIS — Z9013 Acquired absence of bilateral breasts and nipples: Secondary | ICD-10-CM | POA: Diagnosis not present

## 2017-07-03 DIAGNOSIS — E069 Thyroiditis, unspecified: Secondary | ICD-10-CM | POA: Diagnosis not present

## 2017-07-03 NOTE — Telephone Encounter (Signed)
Patient decline avs and calendar °

## 2017-07-03 NOTE — Progress Notes (Signed)
Patient Care Team: Louretta Shorten, MD as PCP - General (Obstetrics and Gynecology)  DIAGNOSIS:  Encounter Diagnosis  Name Primary?  . 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 Harlow Mares       CHIEF COMPLIANT: Follow-up of left breast DCIS treated with bilateral mastectomies  INTERVAL HISTORY: Samantha Andersen is a 52 year old with above-mentioned history of bilateral mastectomies for DCIS in the left breast who is currently in surveillance.  She came in urgently because she felt lymph nodes in bilateral axilla.  REVIEW OF SYSTEMS:   Constitutional: Denies fevers, chills or abnormal weight loss Eyes: Denies blurriness of vision Ears, nose, mouth, throat, and face: Denies mucositis or sore throat Respiratory: Denies cough, dyspnea or wheezes Cardiovascular: Denies palpitation, chest discomfort Gastrointestinal:  Denies nausea, heartburn or change in bowel habits Skin: Denies abnormal skin rashes Lymphatics: Denies new lymphadenopathy or easy bruising Neurological:Denies numbness, tingling or new weaknesses Behavioral/Psych: Mood is stable, no new changes  Extremities: No lower extremity edema Breast: Waxing and waning lymphadenopathy in bilateral axillae All other systems were reviewed with the patient and are negative.  I have reviewed the past medical history, past surgical history, social history and family history with the patient and they are unchanged from previous note.  ALLERGIES:   is allergic to doxycycline; gluten meal; lipitor [atorvastatin]; other; tetracyclines & related; ciprofloxacin; crestor [rosuvastatin]; and metformin and related.  MEDICATIONS:  Current Outpatient Medications  Medication Sig Dispense Refill  . Coenzyme Q10 (COQ10) 50 G POWD Take 100 g by mouth daily.    . diclofenac sodium (VOLTAREN) 1 % GEL Apply topically 4 (four) times daily as needed.    Marland Kitchen VITAMIN D, ERGOCALCIFEROL, PO Take 5,000 Units by mouth daily.      No current facility-administered medications for this visit.     PHYSICAL EXAMINATION: ECOG PERFORMANCE STATUS: 1 - Symptomatic but completely ambulatory  Vitals:   07/03/17 1425  BP: 109/76  Pulse: (!) 108  Resp: 19  Temp: 98.4 F (36.9 C)  SpO2: 98%   Filed Weights   07/03/17 1425  Weight: 173 lb 1.6 oz (78.5 kg)    GENERAL:alert, no distress and comfortable SKIN: skin color, texture, turgor are normal, no rashes or significant lesions EYES: normal, Conjunctiva are pink and non-injected, sclera clear OROPHARYNX:no exudate, no erythema and lips, buccal mucosa, and tongue normal  NECK: supple, thyroid normal size, non-tender, without nodularity LYMPH:  no palpable lymphadenopathy in the cervical, axillary or inguinal LUNGS: clear to auscultation and percussion with normal breathing effort HEART: regular rate & rhythm and no murmurs and no lower extremity edema ABDOMEN:abdomen soft, non-tender and normal bowel sounds MUSCULOSKELETAL:no cyanosis of digits and no clubbing  NEURO: alert & oriented x 3 with fluent speech, no focal motor/sensory deficits EXTREMITIES: No lower extremity edema BREAST: No palpable masses or nodules in either right or left breasts. No palpable axillary supraclavicular or infraclavicular adenopathy no breast tenderness or nipple discharge. (exam  performed in the presence of a chaperone)  LABORATORY DATA:  I have reviewed the data as listed CMP Latest Ref Rng & Units 12/15/2016 11/21/2016  05/25/2015  Glucose 70 - 140 mg/dl 109 87 -  BUN 7.0 - 26.0 mg/dL 20.5 14 -  Creatinine 0.6 - 1.1 mg/dL 0.8 0.77 0.70  Sodium 136 - 145 mEq/L 139 140 -  Potassium 3.5 - 5.1 mEq/L 4.1 4.5 -  Chloride 96 - 106 mmol/L - 101 -  CO2 22 - 29 mEq/L 22 22 -  Calcium 8.4 - 10.4 mg/dL 9.1 10.1 -  Total Protein 6.4 - 8.3 g/dL 7.1 - -  Total Bilirubin 0.20 - 1.20 mg/dL 0.24 - -  Alkaline Phos 40 - 150 U/L 60 - -  AST 5 - 34 U/L 24 - -  ALT 0 - 55 U/L 29 - -    Lab Results  Component Value Date   WBC 7.4 12/15/2016   HGB 13.3 12/15/2016   HCT 40.2 12/15/2016   MCV 93.5 12/15/2016   PLT 275 12/15/2016   NEUTROABS 4.0 12/15/2016    ASSESSMENT & PLAN:  Primary cancer of upper outer quadrant of left female breast ([place]) Primary cancer of upper outer quadrant of left female breast ([place]) 1 DCIS of the left breast- ER positive PR positive/ atypical lobular hyperplasia of right breast . Patient is now status post bilateral mastectomies with bilateral breast implants.   Nodularity in both axillae: I did not feel any lymph node enlargement.  Because she thinks that they are waxing and waning, I requested bilateral axillary ultrasounds at breast center.  2. recent diagnosis of thyroiditis  3. 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 In fact she has been iron deficient in the past.  Patient and her husband work in [employer] and travel internationally all the time.  She went to [place] recently.   Follow-up in 1 year    Orders Placed This Encounter  Procedures  . US BREAST LTD UNI LEFT INC AXILLA    Standing Status:   Future    Standing Expiration Date:   09/03/2018    Order Specific Question:   Reason for Exam (SYMPTOM  OR DIAGNOSIS REQUIRED)    Answer:   Patient palpated LN in axilla    Order Specific Question:   Preferred imaging location?    Answer:   [facility]  . US BREAST LTD UNI RIGHT INC AXILLA    Standing Status:   Future    Standing  Expiration Date:   09/03/2018    Order Specific Question:   Reason for Exam (SYMPTOM  OR DIAGNOSIS REQUIRED)    Answer:   Patient palpated LN in axilla    Order Specific Question:   Preferred imaging location?    Answer:   [facility]   The patient has a good understanding of the overall plan. she agrees with it. she will call with any problems that may develop before the next visit here.   Harriette Ohara, MD 07/03/17

## 2017-07-03 NOTE — Assessment & Plan Note (Signed)
Primary cancer of upper outer quadrant of left female breast ([place]) 1 DCIS of the left breast- ER positive PR positive/ atypical lobular hyperplasia of right breast . Patient is now status post bilateral mastectomies with bilateral breast implants.   Nodularity in both axillae: I did not feel any lymph node enlargement.  Because she thinks that they are waxing and waning, I requested bilateral axillary ultrasounds at breast center.  2. recent diagnosis of thyroiditis  3. 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 In fact she has been iron deficient in the past.  Patient and her husband work in [employer] and travel internationally all the time.  She went to [place] recently.   Follow-up in 1 year

## 2017-07-24 ENCOUNTER — Ambulatory Visit
Admission: RE | Admit: 2017-07-24 | Discharge: 2017-07-24 | Disposition: A | Discharge status: Home / Self Care | Payer: BLUE CROSS/BLUE SHIELD | Source: Ambulatory Visit | Attending: Hematology and Oncology | Admitting: Hematology and Oncology

## 2017-07-24 DIAGNOSIS — C50412 Malignant neoplasm of upper-outer quadrant of left female breast: Secondary | ICD-10-CM

## 2017-07-24 DIAGNOSIS — R2242 Localized swelling, mass and lump, left lower limb: Secondary | ICD-10-CM | POA: Diagnosis not present

## 2017-07-24 DIAGNOSIS — R2241 Localized swelling, mass and lump, right lower limb: Secondary | ICD-10-CM | POA: Diagnosis not present

## 2017-08-09 DIAGNOSIS — Z01419 Encounter for gynecological examination (general) (routine) without abnormal findings: Secondary | ICD-10-CM | POA: Diagnosis not present

## 2017-08-09 DIAGNOSIS — Z13820 Encounter for screening for osteoporosis: Secondary | ICD-10-CM | POA: Diagnosis not present

## 2017-08-09 DIAGNOSIS — Z6831 Body mass index (BMI) 31.0-31.9, adult: Secondary | ICD-10-CM | POA: Diagnosis not present

## 2017-08-28 DIAGNOSIS — M2142 Flat foot [pes planus] (acquired), left foot: Secondary | ICD-10-CM | POA: Diagnosis not present

## 2017-08-28 DIAGNOSIS — M2141 Flat foot [pes planus] (acquired), right foot: Secondary | ICD-10-CM | POA: Diagnosis not present

## 2017-08-28 DIAGNOSIS — M9906 Segmental and somatic dysfunction of lower extremity: Secondary | ICD-10-CM | POA: Diagnosis not present

## 2017-08-30 DIAGNOSIS — R1903 Right lower quadrant abdominal swelling, mass and lump: Secondary | ICD-10-CM | POA: Diagnosis not present

## 2017-09-19 DIAGNOSIS — Z7189 Other specified counseling: Secondary | ICD-10-CM | POA: Diagnosis not present

## 2017-09-19 DIAGNOSIS — E042 Nontoxic multinodular goiter: Secondary | ICD-10-CM | POA: Diagnosis not present

## 2017-09-19 DIAGNOSIS — Z23 Encounter for immunization: Secondary | ICD-10-CM | POA: Diagnosis not present

## 2017-09-19 DIAGNOSIS — F40243 Fear of flying: Secondary | ICD-10-CM | POA: Diagnosis not present

## 2017-12-11 DIAGNOSIS — E042 Nontoxic multinodular goiter: Secondary | ICD-10-CM | POA: Diagnosis not present

## 2017-12-11 DIAGNOSIS — Z148 Genetic carrier of other disease: Secondary | ICD-10-CM | POA: Diagnosis not present

## 2017-12-11 DIAGNOSIS — K900 Celiac disease: Secondary | ICD-10-CM | POA: Diagnosis not present

## 2017-12-11 DIAGNOSIS — R1310 Dysphagia, unspecified: Secondary | ICD-10-CM | POA: Diagnosis not present

## 2017-12-12 DIAGNOSIS — R7301 Impaired fasting glucose: Secondary | ICD-10-CM | POA: Diagnosis not present

## 2017-12-12 DIAGNOSIS — E7800 Pure hypercholesterolemia, unspecified: Secondary | ICD-10-CM | POA: Diagnosis not present

## 2017-12-12 DIAGNOSIS — E538 Deficiency of other specified B group vitamins: Secondary | ICD-10-CM | POA: Diagnosis not present

## 2017-12-12 DIAGNOSIS — E559 Vitamin D deficiency, unspecified: Secondary | ICD-10-CM | POA: Diagnosis not present

## 2017-12-12 DIAGNOSIS — E282 Polycystic ovarian syndrome: Secondary | ICD-10-CM | POA: Diagnosis not present

## 2017-12-12 DIAGNOSIS — E049 Nontoxic goiter, unspecified: Secondary | ICD-10-CM | POA: Diagnosis not present

## 2017-12-14 ENCOUNTER — Inpatient Hospital Stay: Payer: BLUE CROSS/BLUE SHIELD | Admitting: Adult Health

## 2017-12-19 DIAGNOSIS — M545 Low back pain: Secondary | ICD-10-CM | POA: Diagnosis not present

## 2017-12-19 DIAGNOSIS — R768 Other specified abnormal immunological findings in serum: Secondary | ICD-10-CM | POA: Diagnosis not present

## 2017-12-19 DIAGNOSIS — M2550 Pain in unspecified joint: Secondary | ICD-10-CM | POA: Diagnosis not present

## 2017-12-19 DIAGNOSIS — M47899 Other spondylosis, site unspecified: Secondary | ICD-10-CM | POA: Diagnosis not present

## 2018-01-11 DIAGNOSIS — R1310 Dysphagia, unspecified: Secondary | ICD-10-CM | POA: Diagnosis not present

## 2018-01-11 DIAGNOSIS — Z1211 Encounter for screening for malignant neoplasm of colon: Secondary | ICD-10-CM | POA: Diagnosis not present

## 2018-01-11 DIAGNOSIS — Z800 Family history of malignant neoplasm of digestive organs: Secondary | ICD-10-CM | POA: Diagnosis not present

## 2018-01-11 DIAGNOSIS — K900 Celiac disease: Secondary | ICD-10-CM | POA: Diagnosis not present

## 2018-01-24 DIAGNOSIS — K210 Gastro-esophageal reflux disease with esophagitis: Secondary | ICD-10-CM | POA: Diagnosis not present

## 2018-01-24 DIAGNOSIS — Z800 Family history of malignant neoplasm of digestive organs: Secondary | ICD-10-CM | POA: Diagnosis not present

## 2018-01-24 DIAGNOSIS — R1310 Dysphagia, unspecified: Secondary | ICD-10-CM | POA: Diagnosis not present

## 2018-01-24 DIAGNOSIS — Z1211 Encounter for screening for malignant neoplasm of colon: Secondary | ICD-10-CM | POA: Diagnosis not present

## 2018-03-06 DIAGNOSIS — K210 Gastro-esophageal reflux disease with esophagitis: Secondary | ICD-10-CM | POA: Diagnosis not present

## 2018-03-06 DIAGNOSIS — Z800 Family history of malignant neoplasm of digestive organs: Secondary | ICD-10-CM | POA: Diagnosis not present

## 2018-03-06 DIAGNOSIS — K900 Celiac disease: Secondary | ICD-10-CM | POA: Diagnosis not present

## 2018-06-06 ENCOUNTER — Telehealth: Payer: Self-pay | Admitting: Hematology and Oncology

## 2018-06-06 NOTE — Telephone Encounter (Signed)
[place] 6/24 moved f/u to 6/23. Confirmed with patient.  Patient has children who are nurses, with at least one working at [employer] [place] for covid-19 patients. Patient is asking if she should refrain from personal contact with her children 2 weeks prior to her f/u. Message routed to VG/desk nurse to instruct patient - patient aware.

## 2018-06-06 NOTE — Telephone Encounter (Signed)
As long as they are wearing masks and dont have signs or symptoms, they can be around her (she should wear a mask around them) VG

## 2018-06-20 DIAGNOSIS — M545 Low back pain: Secondary | ICD-10-CM | POA: Diagnosis not present

## 2018-06-20 DIAGNOSIS — R768 Other specified abnormal immunological findings in serum: Secondary | ICD-10-CM | POA: Diagnosis not present

## 2018-06-20 DIAGNOSIS — M47899 Other spondylosis, site unspecified: Secondary | ICD-10-CM | POA: Diagnosis not present

## 2018-06-20 DIAGNOSIS — M2550 Pain in unspecified joint: Secondary | ICD-10-CM | POA: Diagnosis not present

## 2018-06-27 NOTE — Assessment & Plan Note (Signed)
DCIS of the left breast- ER positive PR positive/ atypical lobular hyperplasia of right breast . Patient is now status post bilateral mastectomies with bilateral breast implants.  Nodularity in both axillae: 07/24/2017: Bilateral axillary ultrasounds were negative for any suspicious findings.  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 In fact she has been iron deficient in the past.  Patient and her husband work in [employer] and travel internationally all the time.  Follow-up in 1 year

## 2018-06-29 ENCOUNTER — Telehealth: Payer: Self-pay | Admitting: Hematology and Oncology

## 2018-06-29 NOTE — Telephone Encounter (Signed)
I left a message regarding video visit  °

## 2018-07-02 NOTE — Progress Notes (Signed)
HEMATOLOGY-ONCOLOGY MYCHART VIDEO VISIT PROGRESS NOTE  I connected with [name] on 07/03/2018 at 10:15 AM EDT by MyChart video conference and verified that I am speaking with the correct person using two identifiers.  I discussed the limitations, risks, security and privacy concerns of performing an evaluation and management service by MyChart and the availability of in person appointments.  I also discussed with the patient that there may be a patient responsible charge related to this service. The patient expressed understanding and agreed to proceed.  Patient's Location: Home Physician Location: Clinic  CHIEF COMPLIANT: Surveillance of left breast DCIS  INTERVAL HISTORY: Samantha Andersen is a 53 y.o. 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 I last saw her a year ago. Bilateral axillary [place] on 07/24/17 showed no suspicious findings. She presents over MyChart today for annual follow-up.  She does have intermittent swelling and discomfort in the right axilla.  In the past this was evaluated by ultrasounds and they were normal.  She is suffering with gastritis for which she was on omeprazole.  She had upper endoscopy and biopsies and awaiting for H. pylori test results.  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  She plans to take a brief vacation and go somewhere else.  Oncology History  Primary cancer of upper outer quadrant of left female breast ([place])  10/06/2011 Procedure   genetic counseling and testing found to be negative for BRCA1 and 2 gene mutations   11/24/2011 Surgery   left mastectomy: DCIS intermediate grade 5.8 cm 3 SL and negative, ER 99%, PR 69%; right mastectomy: lobular neoplasia atypical lobular hyperplasia   11/24/2011 Surgery   immediate reconstruction with tissue expander by Dr. Harlow Mares     REVIEW OF SYSTEMS:   Constitutional:  Denies fevers, chills or abnormal weight loss Eyes: Denies blurriness of vision Ears, nose, mouth, throat, and face: Denies mucositis or sore throat Respiratory: Denies cough, dyspnea or wheezes Cardiovascular: Denies palpitation, chest discomfort Gastrointestinal: Gastritis being treated with omeprazole.  She had upper endoscopy and biopsies Skin: Denies abnormal skin rashes Lymphatics: Denies new lymphadenopathy or easy bruising Neurological:Denies numbness, tingling or new weaknesses Behavioral/Psych: Mood is stable, no new changes  Extremities: No lower extremity edema Breast: denies any pain or lumps or nodules in either breasts All other systems were reviewed with the patient and are negative.  Observations/Objective:  There were no vitals filed for this visit. There is no height or weight on file to calculate BMI.  I have reviewed the data as listed CMP Latest Ref Rng & Units 12/15/2016 11/21/2016 05/25/2015  Glucose 70 - 140 mg/dl 109 87 -  BUN 7.0 - 26.0 mg/dL 20.5 14 -  Creatinine 0.6 - 1.1 mg/dL 0.8 0.77 0.70  Sodium 136 - 145 mEq/L 139 140 -  Potassium 3.5 - 5.1 mEq/L 4.1 4.5 -  Chloride 96 - 106 mmol/L - 101 -  CO2 22 - 29 mEq/L 22 22 -  Calcium 8.4 - 10.4 mg/dL 9.1 10.1 -  Total Protein 6.4 - 8.3 g/dL 7.1 - -  Total Bilirubin 0.20 - 1.20 mg/dL 0.24 - -  Alkaline Phos 40 - 150 U/L 60 - -  AST 5 - 34 U/L 24 - -  ALT 0 - 55 U/L 29 - -    Lab Results  Component Value Date   WBC 7.4 12/15/2016   HGB  13.3 12/15/2016   HCT 40.2 12/15/2016   MCV 93.5 12/15/2016   PLT 275 12/15/2016   NEUTROABS 4.0 12/15/2016      Assessment Plan:  Primary cancer of upper outer quadrant of left female breast ([place]) DCIS of the left breast- ER positive PR positive/ atypical lobular hyperplasia of right breast . Patient is now status post bilateral mastectomies with bilateral breast implants.  Nodularity in both axillae: 07/24/2017: Bilateral axillary ultrasounds were negative for any  suspicious findings. 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 In fact she has been iron deficient in the past. Patient and her husband work in [employer] and travel internationally all the time.  Follow-up in 1 year  I discussed the assessment and treatment plan with the patient. The patient was provided an opportunity to ask questions and all were answered. The patient agreed with the plan and demonstrated an understanding of the instructions. The patient was advised to call back or seek an in-person evaluation if the symptoms worsen or if the condition fails to improve as anticipated.   I provided 15 minutes of face-to-face MyChart video visit time during this encounter.    Rulon Eisenmenger, MD 07/03/2018   I, Molly Dorshimer, am acting as scribe for Nicholas Lose, MD.  I have reviewed the above documentation for accuracy and completeness, and I agree with the above.

## 2018-07-03 ENCOUNTER — Inpatient Hospital Stay: Payer: BC Managed Care – PPO | Attending: Hematology and Oncology | Admitting: Hematology and Oncology

## 2018-07-03 DIAGNOSIS — Z9012 Acquired absence of left breast and nipple: Secondary | ICD-10-CM

## 2018-07-03 DIAGNOSIS — C50412 Malignant neoplasm of upper-outer quadrant of left female breast: Secondary | ICD-10-CM

## 2018-07-03 DIAGNOSIS — Z170 Estrogen receptor positive status [ER+]: Secondary | ICD-10-CM

## 2018-07-04 ENCOUNTER — Ambulatory Visit: Payer: BLUE CROSS/BLUE SHIELD | Admitting: Hematology and Oncology

## 2018-08-07 DIAGNOSIS — L821 Other seborrheic keratosis: Secondary | ICD-10-CM | POA: Diagnosis not present

## 2018-08-07 DIAGNOSIS — D1801 Hemangioma of skin and subcutaneous tissue: Secondary | ICD-10-CM | POA: Diagnosis not present

## 2018-08-07 DIAGNOSIS — L84 Corns and callosities: Secondary | ICD-10-CM | POA: Diagnosis not present

## 2018-08-07 DIAGNOSIS — D225 Melanocytic nevi of trunk: Secondary | ICD-10-CM | POA: Diagnosis not present

## 2018-08-14 DIAGNOSIS — E538 Deficiency of other specified B group vitamins: Secondary | ICD-10-CM | POA: Diagnosis not present

## 2018-08-14 DIAGNOSIS — E559 Vitamin D deficiency, unspecified: Secondary | ICD-10-CM | POA: Diagnosis not present

## 2018-08-14 DIAGNOSIS — R945 Abnormal results of liver function studies: Secondary | ICD-10-CM | POA: Diagnosis not present

## 2018-08-14 DIAGNOSIS — R202 Paresthesia of skin: Secondary | ICD-10-CM | POA: Diagnosis not present

## 2018-08-15 DIAGNOSIS — E538 Deficiency of other specified B group vitamins: Secondary | ICD-10-CM | POA: Diagnosis not present

## 2018-08-15 DIAGNOSIS — E559 Vitamin D deficiency, unspecified: Secondary | ICD-10-CM | POA: Diagnosis not present

## 2018-08-15 DIAGNOSIS — R202 Paresthesia of skin: Secondary | ICD-10-CM | POA: Diagnosis not present

## 2018-08-29 DIAGNOSIS — N951 Menopausal and female climacteric states: Secondary | ICD-10-CM | POA: Diagnosis not present

## 2018-08-29 DIAGNOSIS — Z6833 Body mass index (BMI) 33.0-33.9, adult: Secondary | ICD-10-CM | POA: Diagnosis not present

## 2018-08-29 DIAGNOSIS — Z01419 Encounter for gynecological examination (general) (routine) without abnormal findings: Secondary | ICD-10-CM | POA: Diagnosis not present

## 2018-08-29 DIAGNOSIS — N83209 Unspecified ovarian cyst, unspecified side: Secondary | ICD-10-CM | POA: Diagnosis not present

## 2018-08-29 DIAGNOSIS — Z8042 Family history of malignant neoplasm of prostate: Secondary | ICD-10-CM | POA: Diagnosis not present

## 2018-08-29 DIAGNOSIS — Z86010 Personal history of colonic polyps: Secondary | ICD-10-CM | POA: Diagnosis not present

## 2018-08-29 DIAGNOSIS — Z86000 Personal history of in-situ neoplasm of breast: Secondary | ICD-10-CM | POA: Diagnosis not present

## 2018-08-29 DIAGNOSIS — Z800 Family history of malignant neoplasm of digestive organs: Secondary | ICD-10-CM | POA: Diagnosis not present

## 2018-09-04 DIAGNOSIS — K210 Gastro-esophageal reflux disease with esophagitis: Secondary | ICD-10-CM | POA: Diagnosis not present

## 2018-09-13 DIAGNOSIS — K900 Celiac disease: Secondary | ICD-10-CM | POA: Diagnosis not present

## 2018-09-13 DIAGNOSIS — Z800 Family history of malignant neoplasm of digestive organs: Secondary | ICD-10-CM | POA: Diagnosis not present

## 2018-09-13 DIAGNOSIS — K210 Gastro-esophageal reflux disease with esophagitis: Secondary | ICD-10-CM | POA: Diagnosis not present

## 2018-09-13 DIAGNOSIS — A048 Other specified bacterial intestinal infections: Secondary | ICD-10-CM | POA: Diagnosis not present

## 2018-09-20 DIAGNOSIS — M9906 Segmental and somatic dysfunction of lower extremity: Secondary | ICD-10-CM | POA: Diagnosis not present

## 2018-09-20 DIAGNOSIS — M2141 Flat foot [pes planus] (acquired), right foot: Secondary | ICD-10-CM | POA: Diagnosis not present

## 2018-09-20 DIAGNOSIS — M2142 Flat foot [pes planus] (acquired), left foot: Secondary | ICD-10-CM | POA: Diagnosis not present

## 2018-09-21 ENCOUNTER — Encounter: Payer: Self-pay | Admitting: Hematology and Oncology

## 2018-10-02 ENCOUNTER — Other Ambulatory Visit: Payer: Self-pay | Admitting: Gastroenterology

## 2018-10-02 DIAGNOSIS — R1011 Right upper quadrant pain: Secondary | ICD-10-CM

## 2018-10-02 DIAGNOSIS — R748 Abnormal levels of other serum enzymes: Secondary | ICD-10-CM | POA: Diagnosis not present

## 2018-10-02 DIAGNOSIS — A048 Other specified bacterial intestinal infections: Secondary | ICD-10-CM | POA: Diagnosis not present

## 2018-10-02 DIAGNOSIS — K210 Gastro-esophageal reflux disease with esophagitis: Secondary | ICD-10-CM | POA: Diagnosis not present

## 2018-10-10 ENCOUNTER — Ambulatory Visit (HOSPITAL_COMMUNITY)
Admission: RE | Admit: 2018-10-10 | Discharge: 2018-10-10 | Disposition: A | Discharge status: Home / Self Care | Payer: BC Managed Care – PPO | Source: Ambulatory Visit | Attending: Gastroenterology | Admitting: Gastroenterology

## 2018-10-10 ENCOUNTER — Other Ambulatory Visit: Payer: Self-pay

## 2018-10-10 ENCOUNTER — Encounter (HOSPITAL_COMMUNITY)
Admission: RE | Admit: 2018-10-10 | Discharge: 2018-10-10 | Disposition: A | Discharge status: Home / Self Care | Payer: BC Managed Care – PPO | Source: Ambulatory Visit | Attending: Gastroenterology | Admitting: Gastroenterology

## 2018-10-10 DIAGNOSIS — R1011 Right upper quadrant pain: Secondary | ICD-10-CM | POA: Insufficient documentation

## 2018-10-10 MED ORDER — TECHNETIUM TC 99M MEBROFENIN IV KIT
5.1700 | PACK | Freq: Once | INTRAVENOUS | Status: AC | PRN
  Administered 2018-10-10: 5.17 via INTRAVENOUS

## 2018-10-12 DIAGNOSIS — Z853 Personal history of malignant neoplasm of breast: Secondary | ICD-10-CM | POA: Diagnosis not present

## 2018-10-12 DIAGNOSIS — Z809 Family history of malignant neoplasm, unspecified: Secondary | ICD-10-CM | POA: Diagnosis not present

## 2018-11-15 DIAGNOSIS — A048 Other specified bacterial intestinal infections: Secondary | ICD-10-CM | POA: Diagnosis not present

## 2018-12-17 DIAGNOSIS — E049 Nontoxic goiter, unspecified: Secondary | ICD-10-CM | POA: Diagnosis not present

## 2018-12-17 DIAGNOSIS — E282 Polycystic ovarian syndrome: Secondary | ICD-10-CM | POA: Diagnosis not present

## 2018-12-17 DIAGNOSIS — R7301 Impaired fasting glucose: Secondary | ICD-10-CM | POA: Diagnosis not present

## 2018-12-17 DIAGNOSIS — E538 Deficiency of other specified B group vitamins: Secondary | ICD-10-CM | POA: Diagnosis not present

## 2018-12-17 DIAGNOSIS — E7800 Pure hypercholesterolemia, unspecified: Secondary | ICD-10-CM | POA: Diagnosis not present

## 2018-12-17 DIAGNOSIS — E559 Vitamin D deficiency, unspecified: Secondary | ICD-10-CM | POA: Diagnosis not present

## 2018-12-20 DIAGNOSIS — K900 Celiac disease: Secondary | ICD-10-CM | POA: Diagnosis not present

## 2018-12-20 DIAGNOSIS — M545 Low back pain: Secondary | ICD-10-CM | POA: Diagnosis not present

## 2018-12-20 DIAGNOSIS — R768 Other specified abnormal immunological findings in serum: Secondary | ICD-10-CM | POA: Diagnosis not present

## 2018-12-20 DIAGNOSIS — M47899 Other spondylosis, site unspecified: Secondary | ICD-10-CM | POA: Diagnosis not present

## 2019-01-21 ENCOUNTER — Encounter (HOSPITAL_COMMUNITY): Payer: Self-pay

## 2019-01-21 ENCOUNTER — Other Ambulatory Visit: Payer: Self-pay

## 2019-01-21 ENCOUNTER — Inpatient Hospital Stay
Admission: RE | Admit: 2019-01-21 | Discharge: 2019-01-21 | Disposition: A | Discharge status: Home / Self Care | Payer: BC Managed Care – PPO | Source: Ambulatory Visit

## 2019-01-21 ENCOUNTER — Ambulatory Visit (HOSPITAL_COMMUNITY)
Admission: EM | Admit: 2019-01-21 | Discharge: 2019-01-21 | Disposition: A | Discharge status: Home / Self Care | Payer: BC Managed Care – PPO | Attending: Family Medicine | Admitting: Family Medicine

## 2019-01-21 DIAGNOSIS — Z20822 Contact with and (suspected) exposure to covid-19: Secondary | ICD-10-CM | POA: Diagnosis not present

## 2019-01-21 NOTE — ED Triage Notes (Signed)
Patient presents to Urgent Care with complaints of COVID Exposure since 5 days ago. Patient reports she has no sx.

## 2019-01-21 NOTE — Discharge Instructions (Addendum)
You have been tested for COVID-19 today. If your test is positive, you will receive a phone call from [facility] regarding your results. Negative test results are not called. Both positive and negative results area always visible on MyChart. If you do not have a MyChart account, sign up instructions are in your discharge papers.

## 2019-01-22 NOTE — ED Provider Notes (Signed)
[facility]   [id number] 01/21/19 Arrival Time: [id number]  ASSESSMENT & PLAN:  1. Exposure to COVID-19 virus      COVID-19 testing sent. See letter/work note on file for self-isolation guidelines.  Follow-up Information    Louretta Shorten, MD.   Specialty: Obstetrics and Gynecology Why: As needed. Contact information: [place], [place] [address] [phone]           Reviewed expectations re: course of current medical issues. Questions answered. Outlined signs and symptoms indicating need for more acute intervention. Patient verbalized understanding. After Visit Summary given.   SUBJECTIVE: History from: patient. Samantha Andersen is a 54 y.o. female who requests COVID-19 testing. Known COVID-19 contact: none. Recent travel: none. Denies: runny nose, congestion, fever, cough, sore throat, difficulty breathing and headache. Normal PO intake without n/v/d.  ROS: As per HPI.   OBJECTIVE:  Vitals:   01/21/19 1818  BP: 135/90  Pulse: 91  Resp: 16  Temp: 98 F (36.7 C)  TempSrc: Oral  SpO2: 100%    General appearance: alert; no distress Eyes: PERRLA; EOMI; conjunctiva normal HENT: [place]; AT; nasal mucosa normal; oral mucosa normal Neck: supple  Lungs: speaks full sentences without difficulty; unlabored Extremities: no edema Skin: warm and dry Neurologic: normal gait Psychological: alert and cooperative; normal mood and affect  Labs:  Labs Reviewed  NOVEL CORONAVIRUS, NAA (HOSP ORDER, SEND-OUT TO REF LAB; TAT 18-24 HRS)      Allergies  Allergen Reactions  . Doxycycline Shortness Of Breath  . Gluten Meal Shortness Of Breath  . Lipitor [Atorvastatin] Other (See Comments)  . Other Shortness Of Breath    SQUASH  . Tetracyclines & Related Shortness Of Breath and Other (See Comments)    Lethargy   . Crestor [Rosuvastatin] Other (See Comments)    Muscle aches and muscle fatigue  . Metformin And Related     Patient prefers to  take Tevo brand metformin XR due to sensitivity rection to other brands. Patient is allergic to gluten and must have gluten free Metformin    Past Medical History:  Diagnosis Date  . Anemia   . Anxiety   . Breast cancer ([place])    left  . Celiac disease   . Complication of anesthesia    tachycardic, also reports that she needed increased anesth. because she has a high threshold for medicine/sedation  . 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 Hot flashes   . Hyperlipidemia   . Inappropriate sinus tachycardia   . Joint pain   . Mild CAD    a. mild nonobstructive CAD (0-25% prox RCA, 0-25% prox LAD).   Marland Kitchen PCOS (polycystic ovarian syndrome)   . PONV (postoperative nausea and vomiting)    also reports N&V accompanies anxiety  . Spondyloarthritis    Social History   Socioeconomic History  . Marital status: Married    Spouse name: Not on file  . Number of children: Not on file  . Years of education: Not on file  . Highest education level: Not on file  Occupational History  . Not on file  Tobacco Use  . Smoking status: Former Smoker    Packs/day: 2.00    Years: 4.00    Pack years: 8.00    Quit date: 10/04/1985    Years since quitting: 33.3  . Smokeless tobacco: Never Used  Substance and Sexual Activity  . Alcohol use: Yes    Comment: rare  . Drug use: No  . Sexual activity:  Not on file  Other Topics Concern  . Not on file  Social History Narrative  . Not on file   Social Determinants of Health   Financial Resource Strain:   . Difficulty of Paying Living Expenses: Not on file  Food Insecurity:   . Worried About Charity fundraiser in the Last Year: Not on file  . Ran Out of Food in the Last Year: Not on file  Transportation Needs:   . Lack of Transportation (Medical): Not on file  . Lack of Transportation (Non-Medical): Not on file  Physical Activity:   . Days of Exercise per Week: Not on file  . Minutes of Exercise per Session: Not on file  Stress:   . Feeling of  Stress : Not on file  Social Connections:   . Frequency of Communication with Friends and Family: Not on file  . Frequency of Social Gatherings with Friends and Family: Not on file  . Attends Religious Services: Not on file  . Active Member of Clubs or Organizations: Not on file  . Attends Archivist Meetings: Not on file  . Marital Status: Not on file  Intimate Partner Violence:   . Fear of Current or Ex-Partner: Not on file  . Emotionally Abused: Not on file  . Physically Abused: Not on file  . Sexually Abused: Not on file   Family History  Problem Relation Age of Onset  . Healthy Father   . Hemochromatosis Sister   . Prostate cancer Paternal Uncle 72  . Cancer Paternal Uncle 52       unknown type of cancer  . Lung cancer Maternal Grandfather    Past Surgical History:  Procedure Laterality Date  . ABDOMINAL HYSTERECTOMY    . CESAREAN SECTION     x2  . SIMPLE MASTECTOMY WITH AXILLARY SENTINEL NODE BIOPSY  11/24/2011   Procedure: SIMPLE MASTECTOMY WITH AXILLARY SENTINEL NODE BIOPSY;  Surgeon: Haywood Lasso, MD;  Location: [facility];  Service: General;  Laterality: Left;  Bilateral Total Mastectomy and Left Sentinel Node  . SIMPLE MASTECTOMY WITH AXILLARY SENTINEL NODE BIOPSY  11/24/2011   Procedure: SIMPLE MASTECTOMY;  Surgeon: Haywood Lasso, MD;  Location: [facility];  Service: General;  Laterality: Right;  . TISSUE EXPANDER PLACEMENT  11/24/2011   Procedure: TISSUE EXPANDER;  Surgeon: Crissie Reese, MD;  Location: [facility];  Service: Plastics;  Laterality: Bilateral;     Vanessa Kick, MD 01/22/19 [id number]

## 2019-01-23 LAB — NOVEL CORONAVIRUS, NAA (HOSP ORDER, SEND-OUT TO REF LAB; TAT 18-24 HRS): SARS-CoV-2, NAA: NOT DETECTED

## 2019-02-12 DIAGNOSIS — M25571 Pain in right ankle and joints of right foot: Secondary | ICD-10-CM | POA: Diagnosis not present

## 2019-02-27 DIAGNOSIS — H16223 Keratoconjunctivitis sicca, not specified as Sjogren's, bilateral: Secondary | ICD-10-CM | POA: Diagnosis not present

## 2019-03-05 DIAGNOSIS — N61 Mastitis without abscess: Secondary | ICD-10-CM | POA: Diagnosis not present

## 2019-03-05 DIAGNOSIS — N649 Disorder of breast, unspecified: Secondary | ICD-10-CM | POA: Diagnosis not present

## 2019-03-18 DIAGNOSIS — R7301 Impaired fasting glucose: Secondary | ICD-10-CM | POA: Diagnosis not present

## 2019-03-18 DIAGNOSIS — E7800 Pure hypercholesterolemia, unspecified: Secondary | ICD-10-CM | POA: Diagnosis not present

## 2019-03-27 DIAGNOSIS — T410X5A Adverse effect of inhaled anesthetics, initial encounter: Secondary | ICD-10-CM | POA: Diagnosis not present

## 2019-03-27 DIAGNOSIS — N650 Deformity of reconstructed breast: Secondary | ICD-10-CM | POA: Diagnosis not present

## 2019-03-27 DIAGNOSIS — Z421 Encounter for breast reconstruction following mastectomy: Secondary | ICD-10-CM | POA: Diagnosis not present

## 2019-05-07 ENCOUNTER — Encounter: Payer: Self-pay | Admitting: Cardiology

## 2019-05-07 ENCOUNTER — Telehealth: Payer: Self-pay | Admitting: Cardiology

## 2019-05-07 ENCOUNTER — Other Ambulatory Visit: Payer: Self-pay

## 2019-05-07 ENCOUNTER — Ambulatory Visit: Payer: BC Managed Care – PPO | Admitting: Cardiology

## 2019-05-07 VITALS — BP 118/78 | HR 106 | Ht 63.0 in | Wt 186.0 lb

## 2019-05-07 DIAGNOSIS — Z789 Other specified health status: Secondary | ICD-10-CM | POA: Diagnosis not present

## 2019-05-07 DIAGNOSIS — R000 Tachycardia, unspecified: Secondary | ICD-10-CM | POA: Diagnosis not present

## 2019-05-07 DIAGNOSIS — E7800 Pure hypercholesterolemia, unspecified: Secondary | ICD-10-CM

## 2019-05-07 MED ORDER — NEXLIZET 180-10 MG PO TABS: 1.0000 | ORAL_TABLET | Freq: Every day | ORAL | 2 refills | Status: DC

## 2019-05-07 NOTE — Progress Notes (Signed)
Cardiology Office Note:    Date:  05/07/2019   ID:  Samantha Andersen, DOB [birth year], MRN [id number]  PCP:  Samantha Shorten, MD  Cardiologist:  No primary care provider on file.  Electrophysiologist:  None   Referring MD: Samantha Shorten, MD   Chief complain: 2 years follow up  History of Present Illness:    Samantha Andersen is a 54 y.o. female with a hx of  breast CA, PCOS, anemia, anxiety, HLD, celiac disease, ankylosing spondylitis, inappropriate sinus tach who presents for overdue follow-up. She was historically followed by Dr. Meda Andersen for concerns of frequent tachycardias with 1 episode observed during diagnostic colonoscopy. 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 Stress echo 2015 was normal. 48-hour monitor 09/2014 showed inappropriate sinus tachycardia for which carvedilol was started. She did not tolerate this nor metoprolol so was switched to diltiazem. She has not tolerated multiple statins and has been on red yeast rice. Coronary CTA 05/2015 showed calcium score of zero, mild nonobstructive CAD (0-25% prox RCA, 0-25% prox LAD). Labs 2017 showed Hgb 14.0 (12/2015), Cr 0.7, K 4.2, LFTs (12/2014), LDL 133 (07/2014), TSH (03/2014).  05/07/2019 - she underwent breast implant removal of her breast implants and she had significant side effects.  She already feels significantly better.  She otherwise denies chest pain or shortness of breath, she gets occasional lower extremity edema.  Her heart rate remains higher but she does not feel palpitation dizziness or syncope.  She has been recently treated for H. pylori, and esophagitis.   Past Medical History:  Diagnosis Date  . Anemia   . Anxiety   . Breast cancer ([place])    left  . Celiac disease   . Complication of anesthesia    tachycardic, also reports that she needed increased anesth. because she has a high threshold for medicine/sedation  . 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 Hot  flashes   . Hyperlipidemia   . Inappropriate sinus tachycardia   . Joint pain   . Mild CAD    a. mild nonobstructive CAD (0-25% prox RCA, 0-25% prox LAD).   Samantha Andersen PCOS (polycystic ovarian syndrome)   . PONV (postoperative nausea and vomiting)    also reports N&V accompanies anxiety  . Spondyloarthritis     Past Surgical History:  Procedure Laterality Date  . ABDOMINAL HYSTERECTOMY    . CESAREAN SECTION     x2  . SIMPLE MASTECTOMY WITH AXILLARY SENTINEL NODE BIOPSY  11/24/2011   Procedure: SIMPLE MASTECTOMY WITH AXILLARY SENTINEL NODE BIOPSY;  Surgeon: Samantha Lasso, MD;  Location: [facility];  Service: General;  Laterality: Left;  Bilateral Total Mastectomy and Left Sentinel Node  . SIMPLE MASTECTOMY WITH AXILLARY SENTINEL NODE BIOPSY  11/24/2011   Procedure: SIMPLE MASTECTOMY;  Surgeon: Samantha Lasso, MD;  Location: [facility];  Service: General;  Laterality: Right;  . TISSUE EXPANDER PLACEMENT  11/24/2011   Procedure: TISSUE EXPANDER;  Surgeon: Samantha Reese, MD;  Location: [facility];  Service: Plastics;  Laterality: Bilateral;    Current Medications: Current Meds  Medication Sig  . Coenzyme Q10 (COQ10) 50 G POWD Take 100 g by mouth daily.  . diclofenac sodium (VOLTAREN) 1 % GEL Apply topically 4 (four) times daily as needed.  Samantha Andersen VITAMIN D, ERGOCALCIFEROL, PO Take 5,000 Units by mouth daily.      Allergies:   Doxycycline, Gluten meal, Lipitor [atorvastatin], Other, Tetracyclines & related, Crestor [rosuvastatin], and Metformin and related  Social History   Socioeconomic History  . Marital status: Married    Spouse name: Not on file  . Number of children: Not on file  . Years of education: Not on file  . Highest education level: Not on file  Occupational History  . Not on file  Tobacco Use  . Smoking status: Former Smoker    Packs/day: 2.00    Years: 4.00    Pack years: 8.00    Quit date: 10/04/1985    Years since quitting: 33.6  . Smokeless tobacco: Never Used  Substance  and Sexual Activity  . Alcohol use: Yes    Comment: rare  . Drug use: No  . Sexual activity: Not on file  Other Topics Concern  . Not on file  Social History Narrative  . Not on file   Social Determinants of Health   Financial Resource Strain:   . Difficulty of Paying Living Expenses:   Food Insecurity:   . Worried About Charity fundraiser in the Last Year:   . Arboriculturist in the Last Year:   Transportation Needs:   . Film/video editor (Medical):   Samantha Andersen Lack of Transportation (Non-Medical):   Physical Activity:   . Days of Exercise per Week:   . Minutes of Exercise per Session:   Stress:   . Feeling of Stress :   Social Connections:   . Frequency of Communication with Friends and Family:   . Frequency of Social Gatherings with Friends and Family:   . Attends Religious Services:   . Active Member of Clubs or Organizations:   . Attends Archivist Meetings:   Samantha Andersen Marital Status:      Family History: The patient's family history includes Cancer (age of onset: 43) in her paternal uncle; Healthy in her father; Hemochromatosis in her sister; Lung cancer in her maternal grandfather; Prostate cancer (age of onset: 54) in her paternal uncle.  ROS:   Please see the history of present illness.    All other systems reviewed and are negative.  EKGs/Labs/Other Studies Reviewed:    The following studies were reviewed today:  EKG:  EKG is ordered today.  The ekg ordered today demonstrates and is tachycardia, 100 bpm, otherwise normal EKG, unchanged from prior, this was personally reviewed.  Recent Labs: No results found for requested labs within last 8760 hours.  Recent Lipid Panel    Component Value Date/Time   CHOL 210 (H) 07/28/2014 0844   CHOL 233 (H) 04/04/2014 1022   TRIG 123.0 07/28/2014 0844   TRIG 118 04/04/2014 1022   HDL 52.10 07/28/2014 0844   HDL 64 04/04/2014 1022   CHOLHDL 4 07/28/2014 0844   VLDL 24.6 07/28/2014 0844   LDLCALC 133 (H)  07/28/2014 0844   LDLCALC 145 (H) 04/04/2014 1022    Physical Exam:    VS:  BP 118/78   Pulse (!) 106   Ht 5\' 3"  (1.6 m)   Wt 186 lb (84.4 kg)   SpO2 97%   BMI 32.95 kg/m     Wt Readings from Last 3 Encounters:  05/07/19 186 lb (84.4 kg)  07/03/17 173 lb 1.6 oz (78.5 kg)  12/15/16 167 lb 14.4 oz (76.2 kg)     GEN:  Well nourished, well developed in no acute distress HEENT: Normal NECK: No JVD; No carotid bruits LYMPHATICS: No lymphadenopathy CARDIAC: RRR, no murmurs, rubs, gallops RESPIRATORY:  Clear to auscultation without rales, wheezing or rhonchi  ABDOMEN: Soft,  non-tender, non-distended MUSCULOSKELETAL:  No edema; No deformity  SKIN: Warm and dry NEUROLOGIC:  Alert and oriented x 3 PSYCHIATRIC:  Normal affect     ASSESSMENT:    1. Inappropriate sinus tachycardia   2. Statin intolerance   3. Pure hypercholesterolemia      PLAN:    In order of problems listed above:  1. Atypical chest pain, minimal CAD on CCTA in 2017, normal exercise treadmill stress test in 2019.. 2. Inappropriate sinus tach -heart rate 100 today again advised to drink 32 ounces of fluids a day, resume regular exercise. 3. hyperlipidemia -she was intolerant to all the statins including red yeast rice, her most recent LDL was 179, triglycerides 100, will start nexclizet 180/10 mg po daily and repeat lipids and LFTs in 2 months. 4. Nonobstructive CAD - insignificant by CTA 05/2015.  As above.  No aspirin as she has significant esophagitis.   Medication Adjustments/Labs and Tests Ordered: Current medicines are reviewed at length with the patient today.  Concerns regarding medicines are outlined above.  No orders of the defined types were placed in this encounter.  No orders of the defined types were placed in this encounter.  There are no Patient Instructions on file for this visit.   Signed, Ena Dawley, MD  05/07/2019 10:36 AM    [place]

## 2019-05-07 NOTE — Telephone Encounter (Signed)
Prior authorization approved. Copay card activated and faxed to pharmacy. Patient made aware it was approved. She was appreciative of the help.

## 2019-05-07 NOTE — Telephone Encounter (Signed)
Pt c/o medication issue:  1. Name of Medication: Bempedoic Acid-Ezetimibe (NEXLIZET) 180-10 MG TABS  2. How are you currently taking this medication (dosage and times per day)? Pt has not started taking this yet   3. Are you having a reaction (difficulty breathing--STAT)? No   4. What is your medication issue? Pharmacy needs prior authorization for this medicine

## 2019-05-07 NOTE — Telephone Encounter (Signed)
Staff message has been sent to our PharmD for further assistance with prior auth on this medication, while pt was being seen in the clinic today. Will route this call to our Pharmacist and Prior Auth Nurse for further review and follow-up.

## 2019-05-07 NOTE — Telephone Encounter (Signed)
Thank you Melissa.

## 2019-05-07 NOTE — Telephone Encounter (Signed)
-----   Message from Ramond Dial, Ephraim sent at 05/07/2019 12:07 PM EDT ----- Regarding: RE: Nexlizet prior auth I will submit ----- Message ----- From: Nuala Alpha, LPN Sent: [id number]  10:36 AM EDT To: Leeroy Bock, RPH-CPP, # Subject: Nexlizet prior auth                            Dr. Meda Coffee started this pt on Nexlizet today. Not sure if it needs prior auth or not. We went ahead and sent in to the pts pharmacy. Can you assist?  Thanks for all you do, Karlene Einstein

## 2019-05-07 NOTE — Patient Instructions (Signed)
Medication Instructions:   START TAKING NEXLIZET 180-10 MG--TAKE ONE TABLET BY MOUTH DAILY  *If you need a refill on your cardiac medications before your next appointment, please call your pharmacy*   Lab Work:  IN 2 MONTHS ON July 08, 2019 TO CHECK CMET AND LIPIDS--PLEASE COME FASTING TO THIS LAB APPOINTMENT  If you have labs (blood work) drawn today and your tests are completely normal, you will receive your results only by: Marland Kitchen MyChart Message (if you have MyChart) OR . A paper copy in the mail If you have any lab test that is abnormal or we need to change your treatment, we will call you to review the results   Follow-Up: At [facility], you and your health needs are our priority.  As part of our continuing mission to provide you with exceptional heart care, we have created designated Provider Care Teams.  These Care Teams include your primary Cardiologist (physician) and Advanced Practice Providers (APPs -  Physician Assistants and Nurse Practitioners) who all work together to provide you with the care you need, when you need it.  We recommend signing up for the patient portal called "MyChart".  Sign up information is provided on this After Visit Summary.  MyChart is used to connect with patients for Virtual Visits (Telemedicine).  Patients are able to view lab/test results, encounter notes, upcoming appointments, etc.  Non-urgent messages can be sent to your provider as well.   To learn more about what you can do with MyChart, go to [email or web address].    Your next appointment:   12 month(s)  The format for your next appointment:   In Person  Provider:   Ena Dawley, MD

## 2019-05-16 ENCOUNTER — Ambulatory Visit: Payer: BC Managed Care – PPO | Admitting: Podiatry

## 2019-05-16 ENCOUNTER — Encounter: Payer: Self-pay | Admitting: Podiatry

## 2019-05-16 ENCOUNTER — Other Ambulatory Visit: Payer: Self-pay

## 2019-05-16 VITALS — BP 121/79 | HR 108 | Temp 97.5°F | Resp 14

## 2019-05-16 DIAGNOSIS — M779 Enthesopathy, unspecified: Secondary | ICD-10-CM | POA: Diagnosis not present

## 2019-05-16 DIAGNOSIS — M2021 Hallux rigidus, right foot: Secondary | ICD-10-CM

## 2019-05-16 DIAGNOSIS — M79671 Pain in right foot: Secondary | ICD-10-CM

## 2019-05-16 NOTE — Patient Instructions (Signed)
Look at getting a New Balance or [place]. I would recommend going to [employer] to get measured for shoes. You need a stiffer sole shoe in order to take the pressure off of the big toe joint. Once you get the orthotic then you can start walking but slow. Start at 1/4 your normal distance and then by the week gradually increase as you are able to.

## 2019-05-27 ENCOUNTER — Other Ambulatory Visit: Payer: Self-pay

## 2019-05-27 ENCOUNTER — Ambulatory Visit (INDEPENDENT_AMBULATORY_CARE_PROVIDER_SITE_OTHER): Payer: BC Managed Care – PPO | Admitting: Orthotics

## 2019-05-27 DIAGNOSIS — M2021 Hallux rigidus, right foot: Secondary | ICD-10-CM | POA: Diagnosis not present

## 2019-05-27 DIAGNOSIS — M79672 Pain in left foot: Secondary | ICD-10-CM

## 2019-05-27 NOTE — Progress Notes (Signed)
Subjective:   Patient ID: Samantha Andersen, female   DOB: 54 y.o.   MRN: [id number]   HPI 54 year old female presents the office today for concerns of pain in the right big toe joint.  She states that it hurts when she get swelling on the joint.  She does walk a 5K every morning does cause discomfort.  Does hurt with shoes at times.  Gets red at times.  No recent injury.  No weakness or falls.  She previously is gone to [facility] and had an x-ray performed and she has this on her phone as she did not want me x-rays today.   Review of Systems  All other systems reviewed and are negative.  Past Medical History:  Diagnosis Date  . Anemia   . Anxiety   . Breast cancer ([place])    left  . Celiac disease   . Complication of anesthesia    tachycardic, also reports that she needed increased anesth. because she has a high threshold for medicine/sedation  . 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 Hot flashes   . Hyperlipidemia   . Inappropriate sinus tachycardia   . Joint pain   . Mild CAD    a. mild nonobstructive CAD (0-25% prox RCA, 0-25% prox LAD).   Marland Kitchen PCOS (polycystic ovarian syndrome)   . PONV (postoperative nausea and vomiting)    also reports N&V accompanies anxiety  . Spondyloarthritis     Past Surgical History:  Procedure Laterality Date  . ABDOMINAL HYSTERECTOMY    . CESAREAN SECTION     x2  . SIMPLE MASTECTOMY WITH AXILLARY SENTINEL NODE BIOPSY  11/24/2011   Procedure: SIMPLE MASTECTOMY WITH AXILLARY SENTINEL NODE BIOPSY;  Surgeon: Haywood Lasso, MD;  Location: [facility];  Service: General;  Laterality: Left;  Bilateral Total Mastectomy and Left Sentinel Node  . SIMPLE MASTECTOMY WITH AXILLARY SENTINEL NODE BIOPSY  11/24/2011   Procedure: SIMPLE MASTECTOMY;  Surgeon: Haywood Lasso, MD;  Location: [facility];  Service: General;  Laterality: Right;  . TISSUE EXPANDER PLACEMENT  11/24/2011   Procedure: TISSUE EXPANDER;  Surgeon: Crissie Reese, MD;  Location: [facility];  Service:  Plastics;  Laterality: Bilateral;     Current Outpatient Medications:  .  Bempedoic Acid-Ezetimibe (NEXLIZET) 180-10 MG TABS, Take 1 tablet by mouth daily., Disp: 90 tablet, Rfl: 2 .  Coenzyme Q10 (COQ10) 50 G POWD, Take 100 g by mouth daily., Disp: , Rfl:  .  diclofenac sodium (VOLTAREN) 1 % GEL, Apply topically 4 (four) times daily as needed., Disp: , Rfl:  .  VITAMIN D, ERGOCALCIFEROL, PO, Take 5,000 Units by mouth daily. , Disp: , Rfl:   Allergies  Allergen Reactions  . Doxycycline Shortness Of Breath  . Gluten Meal Shortness Of Breath  . Lipitor [Atorvastatin] Other (See Comments)  . Other Shortness Of Breath    SQUASH  . Tetracyclines & Related Shortness Of Breath and Other (See Comments)    Lethargy   . Crestor [Rosuvastatin] Other (See Comments)    Muscle aches and muscle fatigue  . Metformin And Related     Patient prefers to take Tevo brand metformin XR due to sensitivity rection to other brands. Patient is allergic to gluten and must have gluten free Metformin          Objective:  Physical Exam  General: AAO x3, NAD  Dermatological: Skin is warm, dry and supple bilateral. Nails x 10 are well manicured; remaining integument appears unremarkable at this time. There  are no open sores, no preulcerative lesions, no rash or signs of infection present.  Vascular: Dorsalis Pedis artery and Posterior Tibial artery pedal pulses are 2/4 bilateral with immedate capillary fill time. Pedal hair growth present. No varicosities and no lower extremity edema present bilateral. There is no pain with calf compression, swelling, warmth, erythema.   Neruologic: Grossly intact via light touch bilateral.   Musculoskeletal: Decreased range of motion of the right first MPJ with dorsal spurring is palpable.  There is minimal edema.  No erythema or warmth today.  There is no other areas of discomfort identified today.  Muscular strength 5/5 in all groups tested bilateral.  Gait: Unassisted,  Nonantalgic.      Assessment:   Right foot hallux limitus, capsulitis first MPJ     Plan:  -Treatment options discussed including all alternatives, risks, and complications -Etiology of symptoms were discussed -Follow-up on new x-rays I did independently review the x-rays that she showed me today.  Arthritic changes present first MPJ. -We discussed both conservative as well as surgical treatment options.  She is to continue conservative care.  Discussed changing shoes as well as wearing stiffer soled shoe.  Discussed also graphite insert versus making a custom orthotic.  She is going to follow-up with [facility] for orthotics.  Return in about 2 months (around 07/16/2019).  She will also follow up with [facility] for orthotics    Trula Slade DPM

## 2019-05-27 NOTE — Progress Notes (Signed)
Patient is here today to be evaluated and cast for CMFO.  Patient has hx of functional hallux limitus (FHL), and needs a supportive orthoses that will plantarflex first ray in order to lower hinge pin of first MPJ and enhance windless effect.  Plan of deep heel cup, hug arch, and reverse mortons extension.  Richy to fab.  

## 2019-05-28 DIAGNOSIS — Z9011 Acquired absence of right breast and nipple: Secondary | ICD-10-CM | POA: Diagnosis not present

## 2019-05-28 DIAGNOSIS — C50912 Malignant neoplasm of unspecified site of left female breast: Secondary | ICD-10-CM | POA: Diagnosis not present

## 2019-06-17 ENCOUNTER — Ambulatory Visit: Payer: BC Managed Care – PPO | Admitting: Orthotics

## 2019-06-17 ENCOUNTER — Other Ambulatory Visit: Payer: Self-pay

## 2019-06-17 DIAGNOSIS — M2021 Hallux rigidus, right foot: Secondary | ICD-10-CM

## 2019-06-17 DIAGNOSIS — M79671 Pain in right foot: Secondary | ICD-10-CM

## 2019-06-17 NOTE — Progress Notes (Signed)
Patient came in today to pick up custom made foot orthotics.  The goals were accomplished and the patient reported no dissatisfaction with said orthotics.  Patient was advised of breakin period and how to report any issues. 

## 2019-06-20 DIAGNOSIS — R768 Other specified abnormal immunological findings in serum: Secondary | ICD-10-CM | POA: Diagnosis not present

## 2019-06-20 DIAGNOSIS — K900 Celiac disease: Secondary | ICD-10-CM | POA: Diagnosis not present

## 2019-06-20 DIAGNOSIS — M47899 Other spondylosis, site unspecified: Secondary | ICD-10-CM | POA: Diagnosis not present

## 2019-07-08 ENCOUNTER — Other Ambulatory Visit: Payer: Self-pay

## 2019-07-08 ENCOUNTER — Other Ambulatory Visit: Payer: BC Managed Care – PPO | Admitting: *Deleted

## 2019-07-08 DIAGNOSIS — E7800 Pure hypercholesterolemia, unspecified: Secondary | ICD-10-CM

## 2019-07-08 DIAGNOSIS — C50912 Malignant neoplasm of unspecified site of left female breast: Secondary | ICD-10-CM | POA: Diagnosis not present

## 2019-07-08 DIAGNOSIS — Z789 Other specified health status: Secondary | ICD-10-CM | POA: Diagnosis not present

## 2019-07-08 DIAGNOSIS — R000 Tachycardia, unspecified: Secondary | ICD-10-CM

## 2019-07-09 ENCOUNTER — Telehealth: Payer: Self-pay | Admitting: Cardiology

## 2019-07-09 LAB — COMPREHENSIVE METABOLIC PANEL
ALT: 23 IU/L (ref 0–32)
AST: 21 IU/L (ref 0–40)
Albumin/Globulin Ratio: 1.6 (ref 1.2–2.2)
Albumin: 4.1 g/dL (ref 3.8–4.9)
Alkaline Phosphatase: 62 IU/L (ref 48–121)
BUN/Creatinine Ratio: 16 (ref 9–23)
BUN: 13 mg/dL (ref 6–24)
Bilirubin Total: 0.2 mg/dL (ref 0.0–1.2)
CO2: 23 mmol/L (ref 20–29)
Calcium: 9 mg/dL (ref 8.7–10.2)
Chloride: 107 mmol/L — ABNORMAL HIGH (ref 96–106)
Creatinine, Ser: 0.79 mg/dL (ref 0.57–1.00)
GFR calc Af Amer: 97 mL/min/{1.73_m2} (ref >59)
GFR calc non Af Amer: 84 mL/min/{1.73_m2} (ref >59)
Globulin, Total: 2.6 g/dL (ref 1.5–4.5)
Glucose: 107 mg/dL — ABNORMAL HIGH (ref 65–99)
Potassium: 4 mmol/L (ref 3.5–5.2)
Sodium: 141 mmol/L (ref 134–144)
Total Protein: 6.7 g/dL (ref 6.0–8.5)

## 2019-07-09 LAB — LIPID PANEL
Chol/HDL Ratio: 3.9 ratio (ref 0.0–4.4)
Cholesterol, Total: 230 mg/dL — ABNORMAL HIGH (ref 100–199)
HDL: 59 mg/dL (ref >39)
LDL Chol Calc (NIH): 154 mg/dL — ABNORMAL HIGH (ref 0–99)
Triglycerides: 99 mg/dL (ref 0–149)
VLDL Cholesterol Cal: 17 mg/dL (ref 5–40)

## 2019-07-09 NOTE — Telephone Encounter (Signed)
Returned pt's call.  She has been made aware of her lab results.

## 2019-07-09 NOTE — Telephone Encounter (Signed)
-----   Message from Nuala Alpha, LPN sent at 7/61/8485  2:24 PM EDT -----  ----- Message ----- From: Dorothy Spark, MD Sent: 07/09/2019  12:54 PM EDT To: Nuala Alpha, LPN  Please ask her if she is taking NEXLIZET, thank you

## 2019-07-09 NOTE — Telephone Encounter (Signed)
    Pt is returning call from [place] to get results

## 2019-07-12 ENCOUNTER — Telehealth: Payer: Self-pay | Admitting: Cardiology

## 2019-07-12 DIAGNOSIS — Z789 Other specified health status: Secondary | ICD-10-CM

## 2019-07-12 DIAGNOSIS — E7800 Pure hypercholesterolemia, unspecified: Secondary | ICD-10-CM

## 2019-07-12 NOTE — Telephone Encounter (Signed)
Nuala Alpha, LPN  05/17/2255 50:51 AM EDT Back to Top    Left message for the pt to call back for results.   Dorothy Spark, MD  07/10/2019 11:24 AM EDT     She does have evidence of mild CAD on CCTA, would she be willing to go to the lipid clinic to explore other options?   Jeanann Lewandowsky, [place]  07/09/2019 3:23 PM EDT     Pt has been made aware of her lab results. She states that it made her feel muscle tired on it and had sob when exerting herself. Pt states all of the symptoms stopped when she stopped the medication. She did state that she only took the medication a couple of days before stopping, she said enough to notice that she didn't like it.    Jeanann Lewandowsky, [place]  07/09/2019 2:31 PM EDT     Left message for pt to call back jw    Dorothy Spark, MD  07/09/2019 12:54 PM EDT     Please ask her if she is taking NEXLIZET, thank you

## 2019-07-12 NOTE — Telephone Encounter (Signed)
Returned call to patient. Made patient aware that she has evidence of mild CAD on CCTA and would recommend that she be seen in the LIPID clinic to discuss other options for cholesterol since she did not tolerate Nexlizet. Patient agreeable. She will be out of town until 7/21 since her mother in law passed. Appointment made in the [facility] on 7/22.

## 2019-07-12 NOTE — Telephone Encounter (Signed)
Patient returning a call from 07/10/2019 to discuss her lab results and options.

## 2019-07-16 DIAGNOSIS — L659 Nonscarring hair loss, unspecified: Secondary | ICD-10-CM | POA: Diagnosis not present

## 2019-07-22 ENCOUNTER — Ambulatory Visit: Payer: BC Managed Care – PPO | Admitting: Podiatry

## 2019-08-01 ENCOUNTER — Encounter: Payer: Self-pay | Admitting: Pharmacist

## 2019-08-01 ENCOUNTER — Other Ambulatory Visit: Payer: Self-pay

## 2019-08-01 ENCOUNTER — Ambulatory Visit (INDEPENDENT_AMBULATORY_CARE_PROVIDER_SITE_OTHER): Payer: BC Managed Care – PPO | Admitting: Pharmacist

## 2019-08-01 DIAGNOSIS — E7800 Pure hypercholesterolemia, unspecified: Secondary | ICD-10-CM

## 2019-08-01 DIAGNOSIS — Z789 Other specified health status: Secondary | ICD-10-CM | POA: Diagnosis not present

## 2019-08-01 NOTE — Patient Instructions (Signed)
It was great meeting you today!  Your LDL goal is <70  Continue your healthy eating and daily exercise  We will recheck your bloodwork on Monday and then call with your results and make a decision on the Repatha  Please call with any questions  Karren Cobble, PharmD, [place], [place] [address] [place], [place] [address] Phone: [phone]; Fax: [phone] 08/01/2019 8:57 AM

## 2019-08-01 NOTE — Progress Notes (Signed)
Patient ID: ASA FATH                 DOB: May 30, 1965                    MRN: [id number]     HPI: Samantha Andersen is a 54 y.o. female patient referred to lipid clinic by [facility]. PMH is significant for breast cancer, CAD, HLD, and PCOS.  Last coronary CT in  May 2017.  Patient current LDL 154 and started Nexlizet on June.  Reports she has been urinating more, has lower back pain, and feeling lightheaded, however has continued to take it.  Is gluten intolerant and follows a heart friendly diet.  Walks every morning for about 1.5 miles. Typically takes her about 45 minutes.   Husband just had stent put in so is very motivated to lower cholesterol and be more healthy.  Would like repeat lipid panel to see if Nexlizet is helping before starting new medication.  Also requests BMP to check kidney function since she has been urinating more frequently.   Current Medications: Nexlizet 180/10 daily Intolerances: Atorvastatin 10 mg, Zetia 10 mg, rosuvastatin 5 mg (muscle aches and fatigue) Risk Factors:  Gestational DM LDL goal: <70  Diet: breakfast: yogurt and granola/fruit, mushroom swiss souffle Lunch and dinner: greek salad, fish, asian foods, fruits.  Drinks mostly water   Exercise: walks 1.5 miles every morning.    Social History: Quit smoking in 1987. Social alcohol drinker, however got back from a trip to [place] where she reports she had drank more  Labs: LDL 154, TC 230, Trigs 99, HDL 59, VLDL 17 (07/08/19)  Past Medical History:  Diagnosis Date   Anemia    Anxiety    Breast cancer ([place])    left   Celiac disease    Complication of anesthesia    tachycardic, also reports that she needed increased anesth. 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 mild nonobstructive CAD (0-25% prox RCA, 0-25% prox LAD).    PCOS  (polycystic ovarian syndrome)    PONV (postoperative nausea and vomiting)    also reports N&V accompanies anxiety   Spondyloarthritis     Current Outpatient Medications on File Prior to Visit  Medication Sig Dispense Refill   Bempedoic Acid-Ezetimibe (NEXLIZET) 180-10 MG TABS Take 1 tablet by mouth daily. 90 tablet 2   Coenzyme Q10 (COQ10) 50 G POWD Take 100 g by mouth daily.     diclofenac sodium (VOLTAREN) 1 % GEL Apply topically 4 (four) times daily as needed.     VITAMIN D, ERGOCALCIFEROL, PO Take 5,000 Units by mouth daily.      No current facility-administered medications on file prior to visit.    Allergies  Allergen Reactions   Doxycycline Shortness Of Breath   Gluten Meal Shortness Of Breath   Lipitor [Atorvastatin] Other (See Comments)   Other Shortness Of Breath    SQUASH   Tetracyclines & Related Shortness Of Breath and Other (See Comments)    Lethargy    Crestor [Rosuvastatin] Other (See Comments)    Muscle aches and muscle fatigue   Metformin And Related     Patient prefers to take Tevo brand metformin XR due to sensitivity rection to other brands. Patient is allergic to gluten and  must have gluten free Metformin    Assessment/Plan:  1. Hyperlipidemia - Patient LDL 154 which is above goal of <70.  Patient currently exercising and eating healthy so would be a good candidate for PCSK9 therapy due to statin intolerance.  Using demo pen, counseled patient on storage, site preparation, and administration.  Patient was able to demonstrate how to correctly use pen in room.  Is interested in starting, however would like to see current LDL level after a month of Nexlizet therapy.  Fasting lipid panel and BMP order placed for Monday and will follow up with patient over phone with results to discuss next steps.  Patient voiced understanding.  Karren Cobble, PharmD, BCACP, [place] [address] [place], [place] [address] Phone:  [phone]; Fax: [phone] 08/01/2019 9:13 AM

## 2019-08-05 ENCOUNTER — Other Ambulatory Visit: Payer: Self-pay

## 2019-08-05 ENCOUNTER — Other Ambulatory Visit: Payer: BC Managed Care – PPO | Admitting: *Deleted

## 2019-08-05 DIAGNOSIS — E7800 Pure hypercholesterolemia, unspecified: Secondary | ICD-10-CM

## 2019-08-05 LAB — LIPID PANEL
Chol/HDL Ratio: 2.3 ratio (ref 0.0–4.4)
Cholesterol, Total: 143 mg/dL (ref 100–199)
HDL: 62 mg/dL (ref >39)
LDL Chol Calc (NIH): 65 mg/dL (ref 0–99)
Triglycerides: 81 mg/dL (ref 0–149)
VLDL Cholesterol Cal: 16 mg/dL (ref 5–40)

## 2019-08-05 LAB — BASIC METABOLIC PANEL
BUN/Creatinine Ratio: 18 (ref 9–23)
BUN: 13 mg/dL (ref 6–24)
CO2: 20 mmol/L (ref 20–29)
Calcium: 9.4 mg/dL (ref 8.7–10.2)
Chloride: 105 mmol/L (ref 96–106)
Creatinine, Ser: 0.73 mg/dL (ref 0.57–1.00)
GFR calc Af Amer: 106 mL/min/{1.73_m2} (ref >59)
GFR calc non Af Amer: 92 mL/min/{1.73_m2} (ref >59)
Glucose: 104 mg/dL — ABNORMAL HIGH (ref 65–99)
Potassium: 4.5 mmol/L (ref 3.5–5.2)
Sodium: 140 mmol/L (ref 134–144)

## 2019-08-06 DIAGNOSIS — C44519 Basal cell carcinoma of skin of other part of trunk: Secondary | ICD-10-CM | POA: Diagnosis not present

## 2019-08-06 DIAGNOSIS — L814 Other melanin hyperpigmentation: Secondary | ICD-10-CM | POA: Diagnosis not present

## 2019-08-06 DIAGNOSIS — D485 Neoplasm of uncertain behavior of skin: Secondary | ICD-10-CM | POA: Diagnosis not present

## 2019-08-06 DIAGNOSIS — D225 Melanocytic nevi of trunk: Secondary | ICD-10-CM | POA: Diagnosis not present

## 2019-08-06 DIAGNOSIS — L820 Inflamed seborrheic keratosis: Secondary | ICD-10-CM | POA: Diagnosis not present

## 2019-08-06 DIAGNOSIS — L910 Hypertrophic scar: Secondary | ICD-10-CM | POA: Diagnosis not present

## 2019-08-06 DIAGNOSIS — L918 Other hypertrophic disorders of the skin: Secondary | ICD-10-CM | POA: Diagnosis not present

## 2019-08-07 ENCOUNTER — Telehealth: Payer: Self-pay | Admitting: Pharmacist

## 2019-08-07 DIAGNOSIS — Z789 Other specified health status: Secondary | ICD-10-CM

## 2019-08-07 DIAGNOSIS — E7800 Pure hypercholesterolemia, unspecified: Secondary | ICD-10-CM

## 2019-08-07 NOTE — Telephone Encounter (Signed)
Spoke with patient regarding LDL results.  LDL now at 65, patient is agreeable to staying on Nexlizet.  Kidney function normal, patient still concerned she is urinating too much.  Recommended schedule appt with PCP to rule out UTI.  3 month recheck and lab work scheduled

## 2019-08-12 NOTE — Telephone Encounter (Signed)
PA for Repatha was approved.  However patient will remain on Nexlizet for the time being.

## 2019-08-13 DIAGNOSIS — L821 Other seborrheic keratosis: Secondary | ICD-10-CM | POA: Diagnosis not present

## 2019-08-13 DIAGNOSIS — C44519 Basal cell carcinoma of skin of other part of trunk: Secondary | ICD-10-CM | POA: Diagnosis not present

## 2019-08-13 DIAGNOSIS — L249 Irritant contact dermatitis, unspecified cause: Secondary | ICD-10-CM | POA: Diagnosis not present

## 2019-10-30 ENCOUNTER — Other Ambulatory Visit: Payer: BC Managed Care – PPO | Admitting: *Deleted

## 2019-10-30 ENCOUNTER — Other Ambulatory Visit: Payer: Self-pay

## 2019-10-30 DIAGNOSIS — E7800 Pure hypercholesterolemia, unspecified: Secondary | ICD-10-CM

## 2019-10-30 DIAGNOSIS — Z789 Other specified health status: Secondary | ICD-10-CM

## 2019-10-30 LAB — LIPID PANEL
Chol/HDL Ratio: 2.5 ratio (ref 0.0–4.4)
Cholesterol, Total: 143 mg/dL (ref 100–199)
HDL: 58 mg/dL (ref >39)
LDL Chol Calc (NIH): 66 mg/dL (ref 0–99)
Triglycerides: 106 mg/dL (ref 0–149)
VLDL Cholesterol Cal: 19 mg/dL (ref 5–40)

## 2019-11-04 ENCOUNTER — Other Ambulatory Visit: Payer: BC Managed Care – PPO

## 2019-11-11 ENCOUNTER — Ambulatory Visit: Payer: BC Managed Care – PPO

## 2019-11-13 ENCOUNTER — Ambulatory Visit (INDEPENDENT_AMBULATORY_CARE_PROVIDER_SITE_OTHER): Payer: BC Managed Care – PPO | Admitting: Pharmacist

## 2019-11-13 ENCOUNTER — Encounter: Payer: Self-pay | Admitting: Pharmacist

## 2019-11-13 ENCOUNTER — Other Ambulatory Visit: Payer: Self-pay

## 2019-11-13 DIAGNOSIS — E7849 Other hyperlipidemia: Secondary | ICD-10-CM

## 2019-11-13 DIAGNOSIS — M791 Myalgia, unspecified site: Secondary | ICD-10-CM

## 2019-11-13 DIAGNOSIS — T466X5A Adverse effect of antihyperlipidemic and antiarteriosclerotic drugs, initial encounter: Secondary | ICD-10-CM | POA: Diagnosis not present

## 2019-11-13 NOTE — Progress Notes (Signed)
Patient ID: CHENE KASINGER                 DOB: [birth year]                    MRN: [id number]     HPI:  Samantha Andersen is a 54 y.o. female patient referred to lipid clinic by [facility]. PMH is significant for breast cancer, CAD, HLD, and PCOS.  Last coronary CT in  May 2017.  Patient current LDL 154 and started Nexlizet on June.  Reports she has been urinating more, has lower back pain, and feeling lightheaded, however has continued to take it.  Is gluten intolerant and follows a heart friendly diet.  Walks every morning for about 1.5 miles. Typically takes her about 45 minutes.   Patient presents today for follow up after 3 months on Nexlizet.  Reports fatigue and SOB since starting medication.  Has had a stressful 3 months, both of her parents died and so did mother in law.  Has to travel to [place] to take care of family issues.  Was recently in [place] and was not able to hike or swim as much as she was used to.      Current Medications: Nexlizet 180-10mg  Intolerances: Atorvastatin 10mg , rosuvastatin 5mg  Risk Factors: GDM LDL goal: <70  Diet: breakfast: yogurt and granola/fruit, mushroom swiss souffle Lunch and dinner: greek salad, fish, asian foods, fruits.  Drinks mostly water   Exercise:  Was previously walking multiple miles a day, has not been able to as much recenty  Social History: Quit smoking in [place]:  LDL 66, TC 143, Trigs 106, HDL 58, VLDL 19 (10/30/19 on Nexlizet)            LDL 154, TC 230, Trigs 99, HDL 59, VLDL 17 (07/08/19, not on any lipid lowering drugs )  Past Medical History:  Diagnosis Date  . Anemia   . Anxiety   . Breast cancer ([place])    left  . Celiac disease   . Complication of anesthesia    tachycardic, also reports that she needed increased anesth. because she has a high threshold for medicine/sedation  . 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 Hot flashes   . Hyperlipidemia   . Inappropriate sinus tachycardia   . Joint pain   . Mild CAD     a. mild nonobstructive CAD (0-25% prox RCA, 0-25% prox LAD).   Marland Kitchen PCOS (polycystic ovarian syndrome)   . PONV (postoperative nausea and vomiting)    also reports N&V accompanies anxiety  . Spondyloarthritis     Current Outpatient Medications on File Prior to Visit  Medication Sig Dispense Refill  . Bempedoic Acid-Ezetimibe (NEXLIZET) 180-10 MG TABS Take 1 tablet by mouth daily. 90 tablet 2  . Coenzyme Q10 (COQ10) 50 G POWD Take 100 g by mouth daily.    . diclofenac sodium (VOLTAREN) 1 % GEL Apply topically 4 (four) times daily as needed.    Marland Kitchen VITAMIN D, ERGOCALCIFEROL, PO Take 5,000 Units by mouth daily.      No current facility-administered medications on file prior to visit.    Allergies  Allergen Reactions  . Doxycycline Shortness Of Breath  . Gluten Meal Shortness Of Breath  . Lipitor [Atorvastatin] Other (See Comments)  . Other Shortness Of Breath    SQUASH  . Tetracyclines & Related Shortness Of Breath and Other (See Comments)    Lethargy   . Crestor [Rosuvastatin] Other (See Comments)  Muscle aches and muscle fatigue  . Metformin And Related     Patient prefers to take Tevo brand metformin XR due to sensitivity rection to other brands. Patient is allergic to gluten and must have gluten free Metformin    Assessment/Plan:  1. Hyperlipidemia - LDL 66 which is at goal of <70, however patient is not tolerating Nexlizet.  Of note, patient has reported SOB with multiple other medications as well.  Suggested to patient to perhaps take a drug holiday and see if symptoms resolve but patient would like to try taking medication every other day for the next month.  Recommended we recheck lipid panel in December to see how much impact this has on her levels.  Patient was agreeable and also requests CMP.  Orders placed and patient scheduled.  Karren Cobble, PharmD, BCACP, [place] [address] [place], [place] [address] Phone: [phone]; Fax:  [phone] 11/13/2019 3:25 PM

## 2019-11-13 NOTE — Patient Instructions (Signed)
It was good seeing you again!  We would like to keep your LDL (bad cholesterol) less than 70  Reduce your Nexlizet to once every other day to see if that helps with your fatigue and shortness of breath  Try to continue to keep a heart healthy diet and increase exercise as tolerated  We will recheck your cholesterol panel on December 16  Karren Cobble, PharmD, [place], [place] [address] [place], [place] [address] Phone: [phone]; Fax: [phone] 11/13/2019 2:39 PM

## 2019-12-13 DIAGNOSIS — L905 Scar conditions and fibrosis of skin: Secondary | ICD-10-CM | POA: Diagnosis not present

## 2019-12-13 DIAGNOSIS — L910 Hypertrophic scar: Secondary | ICD-10-CM | POA: Diagnosis not present

## 2019-12-13 DIAGNOSIS — Z85828 Personal history of other malignant neoplasm of skin: Secondary | ICD-10-CM | POA: Diagnosis not present

## 2019-12-16 ENCOUNTER — Other Ambulatory Visit: Payer: Self-pay | Admitting: Endocrinology

## 2019-12-16 DIAGNOSIS — E282 Polycystic ovarian syndrome: Secondary | ICD-10-CM | POA: Diagnosis not present

## 2019-12-16 DIAGNOSIS — R7301 Impaired fasting glucose: Secondary | ICD-10-CM | POA: Diagnosis not present

## 2019-12-16 DIAGNOSIS — E559 Vitamin D deficiency, unspecified: Secondary | ICD-10-CM | POA: Diagnosis not present

## 2019-12-16 DIAGNOSIS — E049 Nontoxic goiter, unspecified: Secondary | ICD-10-CM

## 2019-12-18 DIAGNOSIS — Z01419 Encounter for gynecological examination (general) (routine) without abnormal findings: Secondary | ICD-10-CM | POA: Diagnosis not present

## 2019-12-18 DIAGNOSIS — Z13820 Encounter for screening for osteoporosis: Secondary | ICD-10-CM | POA: Diagnosis not present

## 2019-12-18 DIAGNOSIS — Z6834 Body mass index (BMI) 34.0-34.9, adult: Secondary | ICD-10-CM | POA: Diagnosis not present

## 2019-12-23 DIAGNOSIS — M47899 Other spondylosis, site unspecified: Secondary | ICD-10-CM | POA: Diagnosis not present

## 2019-12-23 DIAGNOSIS — R768 Other specified abnormal immunological findings in serum: Secondary | ICD-10-CM | POA: Diagnosis not present

## 2019-12-23 DIAGNOSIS — K900 Celiac disease: Secondary | ICD-10-CM | POA: Diagnosis not present

## 2019-12-24 DIAGNOSIS — K900 Celiac disease: Secondary | ICD-10-CM | POA: Diagnosis not present

## 2019-12-24 DIAGNOSIS — K219 Gastro-esophageal reflux disease without esophagitis: Secondary | ICD-10-CM | POA: Diagnosis not present

## 2019-12-24 DIAGNOSIS — Z800 Family history of malignant neoplasm of digestive organs: Secondary | ICD-10-CM | POA: Diagnosis not present

## 2019-12-26 ENCOUNTER — Other Ambulatory Visit: Payer: Self-pay

## 2019-12-26 DIAGNOSIS — E7849 Other hyperlipidemia: Secondary | ICD-10-CM | POA: Diagnosis not present

## 2019-12-26 LAB — COMPREHENSIVE METABOLIC PANEL
ALT: 49 IU/L — ABNORMAL HIGH (ref 0–32)
AST: 36 IU/L (ref 0–40)
Albumin/Globulin Ratio: 1.6 (ref 1.2–2.2)
Albumin: 4.4 g/dL (ref 3.8–4.9)
Alkaline Phosphatase: 57 IU/L (ref 44–121)
BUN/Creatinine Ratio: 19 (ref 9–23)
BUN: 15 mg/dL (ref 6–24)
Bilirubin Total: 0.3 mg/dL (ref 0.0–1.2)
CO2: 25 mmol/L (ref 20–29)
Calcium: 9.6 mg/dL (ref 8.7–10.2)
Chloride: 102 mmol/L (ref 96–106)
Creatinine, Ser: 0.78 mg/dL (ref 0.57–1.00)
GFR calc Af Amer: 98 mL/min/{1.73_m2} (ref >59)
GFR calc non Af Amer: 85 mL/min/{1.73_m2} (ref >59)
Globulin, Total: 2.8 g/dL (ref 1.5–4.5)
Glucose: 97 mg/dL (ref 65–99)
Potassium: 4.1 mmol/L (ref 3.5–5.2)
Sodium: 139 mmol/L (ref 134–144)
Total Protein: 7.2 g/dL (ref 6.0–8.5)

## 2019-12-26 LAB — LIPID PANEL
Chol/HDL Ratio: 2.2 ratio (ref 0.0–4.4)
Cholesterol, Total: 161 mg/dL (ref 100–199)
HDL: 72 mg/dL (ref >39)
LDL Chol Calc (NIH): 76 mg/dL (ref 0–99)
Triglycerides: 68 mg/dL (ref 0–149)
VLDL Cholesterol Cal: 13 mg/dL (ref 5–40)

## 2020-01-20 ENCOUNTER — Ambulatory Visit
Admission: RE | Admit: 2020-01-20 | Discharge: 2020-01-20 | Disposition: A | Discharge status: Home / Self Care | Payer: BC Managed Care – PPO | Source: Ambulatory Visit | Attending: Endocrinology | Admitting: Endocrinology

## 2020-01-20 DIAGNOSIS — E049 Nontoxic goiter, unspecified: Secondary | ICD-10-CM

## 2020-01-20 DIAGNOSIS — E042 Nontoxic multinodular goiter: Secondary | ICD-10-CM | POA: Diagnosis not present

## 2020-01-27 ENCOUNTER — Encounter (INDEPENDENT_AMBULATORY_CARE_PROVIDER_SITE_OTHER): Payer: Self-pay

## 2020-01-28 ENCOUNTER — Ambulatory Visit (INDEPENDENT_AMBULATORY_CARE_PROVIDER_SITE_OTHER): Payer: Self-pay | Admitting: Family Medicine

## 2020-01-29 DIAGNOSIS — K449 Diaphragmatic hernia without obstruction or gangrene: Secondary | ICD-10-CM | POA: Diagnosis not present

## 2020-01-29 DIAGNOSIS — K2100 Gastro-esophageal reflux disease with esophagitis, without bleeding: Secondary | ICD-10-CM | POA: Diagnosis not present

## 2020-01-29 DIAGNOSIS — K2289 Other specified disease of esophagus: Secondary | ICD-10-CM | POA: Diagnosis not present

## 2020-01-30 DIAGNOSIS — L820 Inflamed seborrheic keratosis: Secondary | ICD-10-CM | POA: Diagnosis not present

## 2020-01-30 DIAGNOSIS — Z85828 Personal history of other malignant neoplasm of skin: Secondary | ICD-10-CM | POA: Diagnosis not present

## 2020-01-30 DIAGNOSIS — L910 Hypertrophic scar: Secondary | ICD-10-CM | POA: Diagnosis not present

## 2020-01-30 DIAGNOSIS — L821 Other seborrheic keratosis: Secondary | ICD-10-CM | POA: Diagnosis not present

## 2020-01-30 DIAGNOSIS — L905 Scar conditions and fibrosis of skin: Secondary | ICD-10-CM | POA: Diagnosis not present

## 2020-02-11 ENCOUNTER — Ambulatory Visit (INDEPENDENT_AMBULATORY_CARE_PROVIDER_SITE_OTHER): Payer: Self-pay | Admitting: Family Medicine

## 2020-02-11 ENCOUNTER — Ambulatory Visit (INDEPENDENT_AMBULATORY_CARE_PROVIDER_SITE_OTHER): Payer: BC Managed Care – PPO | Admitting: Family Medicine

## 2020-02-11 ENCOUNTER — Other Ambulatory Visit: Payer: Self-pay

## 2020-02-11 ENCOUNTER — Encounter (INDEPENDENT_AMBULATORY_CARE_PROVIDER_SITE_OTHER): Payer: Self-pay | Admitting: Family Medicine

## 2020-02-11 VITALS — BP 107/74 | HR 94 | Temp 98.3°F | Ht 62.0 in | Wt 186.0 lb

## 2020-02-11 DIAGNOSIS — E538 Deficiency of other specified B group vitamins: Secondary | ICD-10-CM

## 2020-02-11 DIAGNOSIS — R5383 Other fatigue: Secondary | ICD-10-CM | POA: Diagnosis not present

## 2020-02-11 DIAGNOSIS — R739 Hyperglycemia, unspecified: Secondary | ICD-10-CM

## 2020-02-11 DIAGNOSIS — Z9189 Other specified personal risk factors, not elsewhere classified: Secondary | ICD-10-CM

## 2020-02-11 DIAGNOSIS — E785 Hyperlipidemia, unspecified: Secondary | ICD-10-CM

## 2020-02-11 DIAGNOSIS — R0602 Shortness of breath: Secondary | ICD-10-CM

## 2020-02-11 DIAGNOSIS — Z1331 Encounter for screening for depression: Secondary | ICD-10-CM | POA: Diagnosis not present

## 2020-02-11 DIAGNOSIS — E669 Obesity, unspecified: Secondary | ICD-10-CM

## 2020-02-11 DIAGNOSIS — E559 Vitamin D deficiency, unspecified: Secondary | ICD-10-CM

## 2020-02-11 DIAGNOSIS — Z6831 Body mass index (BMI) 31.0-31.9, adult: Secondary | ICD-10-CM

## 2020-02-11 DIAGNOSIS — Z0289 Encounter for other administrative examinations: Secondary | ICD-10-CM

## 2020-02-12 LAB — COMPREHENSIVE METABOLIC PANEL
ALT: 41 IU/L — ABNORMAL HIGH (ref 0–32)
AST: 34 IU/L (ref 0–40)
Albumin/Globulin Ratio: 1.7 (ref 1.2–2.2)
Albumin: 4.5 g/dL (ref 3.8–4.9)
Alkaline Phosphatase: 59 IU/L (ref 44–121)
BUN/Creatinine Ratio: 14 (ref 9–23)
BUN: 11 mg/dL (ref 6–24)
Bilirubin Total: 0.3 mg/dL (ref 0.0–1.2)
CO2: 24 mmol/L (ref 20–29)
Calcium: 9.6 mg/dL (ref 8.7–10.2)
Chloride: 103 mmol/L (ref 96–106)
Creatinine, Ser: 0.76 mg/dL (ref 0.57–1.00)
GFR calc Af Amer: 101 mL/min/{1.73_m2} (ref >59)
GFR calc non Af Amer: 88 mL/min/{1.73_m2} (ref >59)
Globulin, Total: 2.7 g/dL (ref 1.5–4.5)
Glucose: 98 mg/dL (ref 65–99)
Potassium: 4.5 mmol/L (ref 3.5–5.2)
Sodium: 140 mmol/L (ref 134–144)
Total Protein: 7.2 g/dL (ref 6.0–8.5)

## 2020-02-12 LAB — CBC WITH DIFFERENTIAL/PLATELET
Basophils Absolute: 0.1 10*3/uL (ref 0.0–0.2)
Basos: 1 %
EOS (ABSOLUTE): 0.3 10*3/uL (ref 0.0–0.4)
Eos: 4 %
Hematocrit: 41.6 % (ref 34.0–46.6)
Hemoglobin: 13.8 g/dL (ref 11.1–15.9)
Immature Grans (Abs): 0 10*3/uL (ref 0.0–0.1)
Immature Granulocytes: 0 %
Lymphocytes Absolute: 2.2 10*3/uL (ref 0.7–3.1)
Lymphs: 28 %
MCH: 30.3 pg (ref 26.6–33.0)
MCHC: 33.2 g/dL (ref 31.5–35.7)
MCV: 91 fL (ref 79–97)
Monocytes Absolute: 0.3 10*3/uL (ref 0.1–0.9)
Monocytes: 4 %
Neutrophils Absolute: 4.8 10*3/uL (ref 1.4–7.0)
Neutrophils: 63 %
Platelets: 334 10*3/uL (ref 150–450)
RBC: 4.56 x10E6/uL (ref 3.77–5.28)
RDW: 12.9 % (ref 11.7–15.4)
WBC: 7.7 10*3/uL (ref 3.4–10.8)

## 2020-02-12 LAB — LIPID PANEL WITH LDL/HDL RATIO
Cholesterol, Total: 174 mg/dL (ref 100–199)
HDL: 63 mg/dL (ref >39)
LDL Chol Calc (NIH): 94 mg/dL (ref 0–99)
LDL/HDL Ratio: 1.5 ratio (ref 0.0–3.2)
Triglycerides: 96 mg/dL (ref 0–149)
VLDL Cholesterol Cal: 17 mg/dL (ref 5–40)

## 2020-02-12 LAB — VITAMIN D 25 HYDROXY (VIT D DEFICIENCY, FRACTURES): Vit D, 25-Hydroxy: 31.4 ng/mL (ref 30.0–100.0)

## 2020-02-12 LAB — VITAMIN B12: Vitamin B-12: 1354 pg/mL — ABNORMAL HIGH (ref 232–1245)

## 2020-02-12 LAB — INSULIN, RANDOM: INSULIN: 11.7 u[IU]/mL (ref 2.6–24.9)

## 2020-02-12 LAB — FOLATE: Folate: 10.6 ng/mL (ref >3.0)

## 2020-02-12 LAB — T4: T4, Total: 8 ug/dL (ref 4.5–12.0)

## 2020-02-12 LAB — HEMOGLOBIN A1C
Est. average glucose Bld gHb Est-mCnc: 131 mg/dL
Hgb A1c MFr Bld: 6.2 % — ABNORMAL HIGH (ref 4.8–5.6)

## 2020-02-12 LAB — TSH: TSH: 2.11 u[IU]/mL (ref 0.450–4.500)

## 2020-02-12 LAB — T3: T3, Total: 121 ng/dL (ref 71–180)

## 2020-02-12 NOTE — Progress Notes (Signed)
Chief Complaint:   Samantha Andersen (MR# [id number]) is a 55 y.o. female who presents for evaluation and treatment of obesity and related comorbidities. Current BMI is Body mass index is 34.02 kg/m. Samantha Andersen has been struggling with her weight for many years and has been unsuccessful in either losing weight, maintaining weight loss, or reaching her healthy weight goal.  Samantha Andersen is currently in the action stage of change and ready to dedicate time achieving and maintaining a healthier weight. Samantha Andersen is interested in becoming our patient and working on intensive lifestyle modifications including (but not limited to) diet and exercise for weight loss.  Samantha Andersen's habits were reviewed today and are as follows: Her family eats meals together, she thinks her family will eat healthier with her, she struggles with family and or coworkers weight loss sabotage, her desired weight loss is 51 lbs, she started gaining weight after her treatment for H-pylori and statins, her heaviest weight ever was 192 pounds, she has significant food cravings issues, she skips meals frequently, she is frequently drinking liquids with calories, she frequently makes poor food choices, she frequently eats larger portions than normal and she struggles with emotional eating.  Depression Screen Samantha Andersen's Food and Mood (modified PHQ-9) score was 9.  Depression screen PHQ 2/9 02/11/2020  Decreased Interest 1  Down, Depressed, Hopeless 1  PHQ - 2 Score 2  Altered sleeping 2  Tired, decreased energy 1  Change in appetite 1  Feeling bad or failure about yourself  1  Trouble concentrating 1  Moving slowly or fidgety/restless 1  Suicidal thoughts 0  PHQ-9 Score 9   Subjective:   1. Other fatigue Samantha Andersen admits to daytime somnolence and admits to waking up still tired. Patent has a history of symptoms of daytime fatigue. Samantha Andersen generally gets 4 or 6 hours of sleep per night, and states that she has nightime awakenings. Snoring  is present. Apneic episodes are not present. Epworth Sleepiness Score is 15.  2. Shortness of breath on exertion Samantha Andersen notes increasing shortness of breath with exercising and seems to be worsening over time with weight gain. She notes getting out of breath sooner with activity than she used to. This has not gotten worse recently. Samantha Andersen denies shortness of breath at rest or orthopnea.  3. Hyperlipidemia, unspecified hyperlipidemia type Samantha Andersen has a history of elevated cholesterol. She is working on diet and weight loss.  4. Vitamin D deficiency Samantha Andersen is on Vit D OTC, and she has no recent labs. She denies fatigue.  5. Hyperglycemia Samantha Andersen has a history of gestational diabetes mellitus with both pregnancies. She notes polyphagia.  6. B12 deficiency Samantha Andersen has a history of iron deficiency anemia. She has no history of gastric bypass surgery. She notes fatigue.  7. At risk for diabetes mellitus Samantha Andersen is at higher than average risk for developing diabetes due to obesity.   Assessment/Plan:   1. Other fatigue Samantha Andersen does feel that her weight is causing her energy to be lower than it should be. Fatigue may be related to obesity, depression or many other causes. Labs will be ordered, and in the meanwhile, Samantha Andersen will focus on self care including making healthy food choices, increasing physical activity and focusing on stress reduction.  - EKG 12-Lead - CBC with Differential/Platelet - Folate - T3 - T4 - TSH  2. Shortness of breath on exertion Samantha Andersen does feel that she gets out of breath more easily that she used to when she exercises. Samantha Andersen's  shortness of breath appears to be obesity related and exercise induced. She has agreed to work on weight loss and gradually increase exercise to treat her exercise induced shortness of breath. Will continue to monitor closely.  3. Hyperlipidemia, unspecified hyperlipidemia type Cardiovascular risk and specific lipid/LDL goals reviewed. We discussed  several lifestyle modifications today. We will check labs today. Samantha Andersen will start her Category 2 plan, and will continue to work on exercise and weight loss efforts. Orders and follow up as documented in patient record.   - Lipid Panel With LDL/HDL Ratio  4. Vitamin D deficiency Low Vitamin D level contributes to fatigue and are associated with obesity, breast, and colon cancer. Samantha Andersen will follow-up for routine testing of Vitamin D, at least 2-3 times per year to avoid over-replacement. We will check labs today.  - VITAMIN D 25 Hydroxy (Vit-D Deficiency, Fractures)  5. Hyperglycemia Fasting labs will be obtained today, and results with be discussed with Samantha Andersen in 2 weeks at her follow up visit. In the meanwhile Samantha Andersen was started on her Category 2 plan and will work on weight loss efforts.  - Comprehensive metabolic panel - Hemoglobin A1c - Insulin, random  6. B12 deficiency The diagnosis was reviewed with the patient. We will check labs today, and will continue to monitor. Orders and follow up as documented in patient record.  - Vitamin B12  7. Screening for depression Samantha Andersen had a positive depression screening. Depression is commonly associated with obesity and often results in emotional eating behaviors. We will monitor this closely and work on CBT to help improve the non-hunger eating patterns. Referral to Psychology may be required if no improvement is seen as she continues in our clinic.  8. At risk for diabetes mellitus Samantha Andersen was given approximately 30 minutes of diabetes education and counseling today. We discussed intensive lifestyle modifications today with an emphasis on weight loss as well as increasing exercise and decreasing simple carbohydrates in her diet. We also reviewed medication options with an emphasis on risk versus benefit of those discussed.   Repetitive spaced learning was employed today to elicit superior memory formation and behavioral change.  9. Class 1 obesity  with serious comorbidity and body mass index (BMI) of 31.0 to 31.9 in adult, unspecified obesity type Samantha Andersen is currently in the action stage of change and her goal is to continue with weight loss efforts. I recommend Samantha Andersen begin the structured treatment plan as follows:  She has agreed to the Category 2 Plan.  Exercise goals: No exercise has been prescribed for now, while we concentrate on nutritional changes.  Behavioral modification strategies: increasing lean protein intake, decreasing simple carbohydrates and meal planning and cooking strategies.  She was informed of the importance of frequent follow-up visits to maximize her success with intensive lifestyle modifications for her multiple health conditions. She was informed we would discuss her lab results at her next visit unless there is a critical issue that needs to be addressed sooner. Samantha Andersen agreed to keep her next visit at the agreed upon time to discuss these results.  Objective:   Blood pressure 107/74, pulse 94, temperature 98.3 F (36.8 C), height 5\' 2"  (1.575 m), weight 186 lb (84.4 kg), SpO2 96 %. Body mass index is 34.02 kg/m.  EKG: Normal sinus rhythm, rate 93 BPM.  Indirect Calorimeter completed today shows a VO2 of 182 and a REE of 1268.  Her calculated basal metabolic rate is [id number] thus her basal metabolic rate is worse than expected.  General: Cooperative, alert, well developed, in no acute distress. HEENT: Conjunctivae and lids unremarkable. Cardiovascular: Regular rhythm.  Lungs: Normal work of breathing. Neurologic: No focal deficits.   Lab Results  Component Value Date   CREATININE 0.76 02/11/2020   BUN 11 02/11/2020   NA 140 02/11/2020   K 4.5 02/11/2020   CL 103 02/11/2020   CO2 24 02/11/2020   Lab Results  Component Value Date   ALT 41 (H) 02/11/2020   AST 34 02/11/2020   ALKPHOS 59 02/11/2020   BILITOT 0.3 02/11/2020   Lab Results  Component Value Date   HGBA1C 6.2 (H) 02/11/2020   Lab  Results  Component Value Date   INSULIN 11.7 02/11/2020   Lab Results  Component Value Date   TSH 2.110 02/11/2020   Lab Results  Component Value Date   CHOL 174 02/11/2020   HDL 63 02/11/2020   LDLCALC 94 02/11/2020   TRIG 96 02/11/2020   CHOLHDL 2.2 12/26/2019   Lab Results  Component Value Date   WBC 7.7 02/11/2020   HGB 13.8 02/11/2020   HCT 41.6 02/11/2020   MCV 91 02/11/2020   PLT 334 02/11/2020   Lab Results  Component Value Date   IRON 69 12/15/2016   TIBC 296 12/15/2016   FERRITIN 42 12/15/2016   Attestation Statements:   Reviewed by clinician on day of visit: allergies, medications, problem list, medical history, surgical history, family history, social history, and previous encounter notes.   I, Trixie Dredge, am acting as transcriptionist for Dennard Nip, MD.  I have reviewed the above documentation for accuracy and completeness, and I agree with the above. - Dennard Nip, MD

## 2020-02-25 ENCOUNTER — Other Ambulatory Visit: Payer: Self-pay

## 2020-02-25 ENCOUNTER — Ambulatory Visit (INDEPENDENT_AMBULATORY_CARE_PROVIDER_SITE_OTHER): Payer: BC Managed Care – PPO | Admitting: Family Medicine

## 2020-02-25 ENCOUNTER — Encounter (INDEPENDENT_AMBULATORY_CARE_PROVIDER_SITE_OTHER): Payer: Self-pay | Admitting: Family Medicine

## 2020-02-25 VITALS — BP 100/69 | HR 75 | Temp 97.8°F | Ht 62.0 in | Wt 181.0 lb

## 2020-02-25 DIAGNOSIS — E559 Vitamin D deficiency, unspecified: Secondary | ICD-10-CM

## 2020-02-25 DIAGNOSIS — E669 Obesity, unspecified: Secondary | ICD-10-CM | POA: Diagnosis not present

## 2020-02-25 DIAGNOSIS — Z6833 Body mass index (BMI) 33.0-33.9, adult: Secondary | ICD-10-CM | POA: Diagnosis not present

## 2020-02-25 DIAGNOSIS — R7303 Prediabetes: Secondary | ICD-10-CM

## 2020-02-25 DIAGNOSIS — R7989 Other specified abnormal findings of blood chemistry: Secondary | ICD-10-CM

## 2020-02-25 DIAGNOSIS — E785 Hyperlipidemia, unspecified: Secondary | ICD-10-CM

## 2020-02-25 DIAGNOSIS — Z9189 Other specified personal risk factors, not elsewhere classified: Secondary | ICD-10-CM

## 2020-02-25 MED ORDER — VITAMIN D (ERGOCALCIFEROL) 1.25 MG (50000 UNIT) PO CAPS: 50000.0000 [IU] | ORAL_CAPSULE | ORAL | 0 refills | Status: DC

## 2020-02-25 MED ORDER — METFORMIN HCL 500 MG PO TABS: 500.0000 mg | ORAL_TABLET | Freq: Every day | ORAL | 0 refills | Status: DC

## 2020-02-26 NOTE — Progress Notes (Signed)
Chief Complaint:   OBESITY Samantha Andersen is here to discuss her progress with her obesity treatment plan along with follow-up of her obesity related diagnoses. Samantha Andersen is on the Category 2 Plan and states she is following her eating plan approximately 93% of the time. Samantha Andersen states she is doing 0 minutes 0 times per week.  Today's visit was #: 2 Starting weight: 186 lbs Starting date: 02/11/2020 Today's weight: 181 lbs Today's date: 02/25/2020 Total lbs lost to date: 5 Total lbs lost since last in-office visit: 5  Interim History: Samantha Andersen has done well with weight loss on her Category 2 plan. Her hunger was mostly controlled after moving her food around to different times.  Subjective:   1. Pre-diabetes Samantha Andersen's A1c and insulin are elevated. She has a history of gestational diabetes mellitus and polycystic ovarian syndrome. She has celiac disease, so she needs a gluten free metformin. I discussed labs with the patient today.  2. Vitamin D deficiency Samantha Andersen is on OTC Vit D, but her level is not yet at goal. She notes fatigue. I discussed labs with the patient today.  3. At risk for diabetes mellitus Samantha Andersen is at higher than average risk for developing diabetes due to obesity.   Assessment/Plan:   1. Pre-diabetes Samantha Andersen agreed to start metformin 500 mg q AM with no refills. She will continue to work on weight loss, exercise, and decreasing simple carbohydrates to help decrease the risk of diabetes.   - metFORMIN (GLUCOPHAGE) 500 MG tablet; Take 1 tablet (500 mg total) by mouth daily with breakfast.  Dispense: 30 tablet; Refill: 0  2. Vitamin D deficiency Low Vitamin D level contributes to fatigue and are associated with obesity, breast, and colon cancer. Samantha Andersen agreed to star prescription Vitamin D 50,000 IU every week with no refills, and continue OTC Vit D. She will follow-up for routine testing of Vitamin D, at least 2-3 times per year to avoid over-replacement.  - Vitamin D, Ergocalciferol,  (DRISDOL) 1.25 MG (50000 UNIT) CAPS capsule; Take 1 capsule (50,000 Units total) by mouth every 7 (seven) days.  Dispense: 4 capsule; Refill: 0  3. At risk for diabetes mellitus Samantha Andersen was given approximately 30 minutes of diabetes education and counseling today. We discussed intensive lifestyle modifications today with an emphasis on weight loss as well as increasing exercise and decreasing simple carbohydrates in her diet. We also reviewed medication options with an emphasis on risk versus benefit of those discussed.   Repetitive spaced learning was employed today to elicit superior memory formation and behavioral change.  4. Class 1 obesity with serious comorbidity and body mass index (BMI) of 33.0 to 33.9 in adult, unspecified obesity type Samantha Andersen is currently in the action stage of change. As such, her goal is to continue with weight loss efforts. She has agreed to the Category 2 Plan.   Behavioral modification strategies: increasing lean protein intake.  Tram has agreed to follow-up with our clinic in 2 weeks. She was informed of the importance of frequent follow-up visits to maximize her success with intensive lifestyle modifications for her multiple health conditions.   Objective:   Blood pressure 100/69, pulse 75, temperature 97.8 F (36.6 C), height 5\' 2"  (1.575 m), weight 181 lb (82.1 kg), SpO2 97 %. Body mass index is 33.11 kg/m.  General: Cooperative, alert, well developed, in no acute distress. HEENT: Conjunctivae and lids unremarkable. Cardiovascular: Regular rhythm.  Lungs: Normal work of breathing. Neurologic: No focal deficits.   Lab Results  Component Value Date   CREATININE 0.76 02/11/2020   BUN 11 02/11/2020   NA 140 02/11/2020   K 4.5 02/11/2020   CL 103 02/11/2020   CO2 24 02/11/2020   Lab Results  Component Value Date   ALT 41 (H) 02/11/2020   AST 34 02/11/2020   ALKPHOS 59 02/11/2020   BILITOT 0.3 02/11/2020   Lab Results  Component Value Date    HGBA1C 6.2 (H) 02/11/2020   Lab Results  Component Value Date   INSULIN 11.7 02/11/2020   Lab Results  Component Value Date   TSH 2.110 02/11/2020   Lab Results  Component Value Date   CHOL 174 02/11/2020   HDL 63 02/11/2020   LDLCALC 94 02/11/2020   TRIG 96 02/11/2020   CHOLHDL 2.2 12/26/2019   Lab Results  Component Value Date   WBC 7.7 02/11/2020   HGB 13.8 02/11/2020   HCT 41.6 02/11/2020   MCV 91 02/11/2020   PLT 334 02/11/2020   Lab Results  Component Value Date   IRON 69 12/15/2016   TIBC 296 12/15/2016   FERRITIN 42 12/15/2016   Attestation Statements:   Reviewed by clinician on day of visit: allergies, medications, problem list, medical history, surgical history, family history, social history, and previous encounter notes.   I, Trixie Dredge, am acting as transcriptionist for Dennard Nip, MD.  I have reviewed the above documentation for accuracy and completeness, and I agree with the above. -  Dennard Nip, MD

## 2020-02-27 ENCOUNTER — Other Ambulatory Visit: Payer: Self-pay | Admitting: Gastroenterology

## 2020-02-27 DIAGNOSIS — K219 Gastro-esophageal reflux disease without esophagitis: Secondary | ICD-10-CM | POA: Diagnosis not present

## 2020-02-27 DIAGNOSIS — K900 Celiac disease: Secondary | ICD-10-CM | POA: Diagnosis not present

## 2020-02-27 DIAGNOSIS — E669 Obesity, unspecified: Secondary | ICD-10-CM | POA: Diagnosis not present

## 2020-02-27 DIAGNOSIS — R748 Abnormal levels of other serum enzymes: Secondary | ICD-10-CM | POA: Diagnosis not present

## 2020-02-27 DIAGNOSIS — R7989 Other specified abnormal findings of blood chemistry: Secondary | ICD-10-CM

## 2020-03-05 DIAGNOSIS — R202 Paresthesia of skin: Secondary | ICD-10-CM | POA: Diagnosis not present

## 2020-03-05 DIAGNOSIS — L308 Other specified dermatitis: Secondary | ICD-10-CM | POA: Diagnosis not present

## 2020-03-05 DIAGNOSIS — L820 Inflamed seborrheic keratosis: Secondary | ICD-10-CM | POA: Diagnosis not present

## 2020-03-05 DIAGNOSIS — L910 Hypertrophic scar: Secondary | ICD-10-CM | POA: Diagnosis not present

## 2020-03-10 ENCOUNTER — Other Ambulatory Visit: Payer: Self-pay

## 2020-03-10 ENCOUNTER — Ambulatory Visit (INDEPENDENT_AMBULATORY_CARE_PROVIDER_SITE_OTHER): Payer: BC Managed Care – PPO | Admitting: Physician Assistant

## 2020-03-10 ENCOUNTER — Encounter (INDEPENDENT_AMBULATORY_CARE_PROVIDER_SITE_OTHER): Payer: Self-pay | Admitting: Physician Assistant

## 2020-03-10 VITALS — BP 101/69 | HR 75 | Temp 98.3°F | Ht 62.0 in | Wt 177.0 lb

## 2020-03-10 DIAGNOSIS — E559 Vitamin D deficiency, unspecified: Secondary | ICD-10-CM

## 2020-03-10 DIAGNOSIS — Z9189 Other specified personal risk factors, not elsewhere classified: Secondary | ICD-10-CM

## 2020-03-10 DIAGNOSIS — Z6833 Body mass index (BMI) 33.0-33.9, adult: Secondary | ICD-10-CM

## 2020-03-10 DIAGNOSIS — E669 Obesity, unspecified: Secondary | ICD-10-CM | POA: Diagnosis not present

## 2020-03-10 DIAGNOSIS — R7303 Prediabetes: Secondary | ICD-10-CM

## 2020-03-10 MED ORDER — VITAMIN D (ERGOCALCIFEROL) 1.25 MG (50000 UNIT) PO CAPS: 50000.0000 [IU] | ORAL_CAPSULE | ORAL | 0 refills | Status: DC

## 2020-03-10 MED ORDER — METFORMIN HCL ER 500 MG PO TB24: 500.0000 mg | ORAL_TABLET | Freq: Every day | ORAL | 0 refills | Status: DC

## 2020-03-11 NOTE — Progress Notes (Signed)
Chief Complaint:   OBESITY Samantha Andersen is here to discuss her progress with her obesity treatment plan along with follow-up of her obesity related diagnoses. Samantha Andersen is on the Category 2 Plan and states she is following her eating plan approximately 99% of the time. Samantha Andersen states she is doing 0 minutes 0 times per week.  Today's visit was #: 3 Starting weight: 186 lbs Starting date: 02/11/2020 Today's weight: 177 lbs Today's date: 03/10/2020 Total lbs lost to date: 9 Total lbs lost since last in-office visit: 4  Interim History: Samantha Andersen is gluten free and is looking for a substitution for bread and she a substitute for cheese as well. She is asking about seasonings today. She may be leaving to take care of her sister in [place] and she is asking for extra refills.  Subjective:   1. Pre-diabetes Samantha Andersen is on metformin, and she denies nausea, vomiting, or diarrhea. She has been on XR metformin and she likes it better as it decreases her hunger.  2. Vitamin D deficiency Samantha Andersen is on Vit D, and she denies nausea, vomiting, or muscle weakness.  3. At risk for diabetes mellitus Samantha Andersen is at higher than average risk for developing diabetes due to obesity.   Assessment/Plan:   1. Pre-diabetes Samantha Andersen will continue to work on weight loss, exercise, and decreasing simple carbohydrates to help decrease the risk of diabetes. Samantha Andersen agreed to change to metformin XR and we will refill for 2 months.  - metFORMIN (GLUCOPHAGE XR) 500 MG 24 hr tablet; Take 1 tablet (500 mg total) by mouth daily with breakfast.  Dispense: 60 tablet; Refill: 0  2. Vitamin D deficiency Low Vitamin D level contributes to fatigue and are associated with obesity, breast, and colon cancer. We will refill prescription Vitamin D for 2 months. Samantha Andersen will follow-up for routine testing of Vitamin D, at least 2-3 times per year to avoid over-replacement.  - Vitamin D, Ergocalciferol, (DRISDOL) 1.25 MG (50000 UNIT) CAPS capsule; Take 1 capsule  (50,000 Units total) by mouth every 7 (seven) days.  Dispense: 8 capsule; Refill: 0  3. At risk for diabetes mellitus Samantha Andersen was given approximately 15 minutes of diabetes education and counseling today. We discussed intensive lifestyle modifications today with an emphasis on weight loss as well as increasing exercise and decreasing simple carbohydrates in her diet. We also reviewed medication options with an emphasis on risk versus benefit of those discussed.   Repetitive spaced learning was employed today to elicit superior memory formation and behavioral change.  4. Class 1 obesity with serious comorbidity and body mass index (BMI) of 33.0 to 33.9 in adult, unspecified obesity type Samantha Andersen is currently in the action stage of change. As such, her goal is to continue with weight loss efforts. She has agreed to the Category 2 Plan.   Exercise goals: No exercise has been prescribed at this time.  Behavioral modification strategies: meal planning and cooking strategies and planning for success.  Samantha Andersen has agreed to follow-up with our clinic in 2 weeks. She was informed of the importance of frequent follow-up visits to maximize her success with intensive lifestyle modifications for her multiple health conditions.   Objective:   Blood pressure 101/69, pulse 75, temperature 98.3 F (36.8 C), height 5\' 2"  (1.575 m), weight 177 lb (80.3 kg), SpO2 97 %. Body mass index is 32.37 kg/m.  General: Cooperative, alert, well developed, in no acute distress. HEENT: Conjunctivae and lids unremarkable. Cardiovascular: Regular rhythm.  Lungs: Normal work of  breathing. Neurologic: No focal deficits.   Lab Results  Component Value Date   CREATININE 0.76 02/11/2020   BUN 11 02/11/2020   NA 140 02/11/2020   K 4.5 02/11/2020   CL 103 02/11/2020   CO2 24 02/11/2020   Lab Results  Component Value Date   ALT 41 (H) 02/11/2020   AST 34 02/11/2020   ALKPHOS 59 02/11/2020   BILITOT 0.3 02/11/2020   Lab  Results  Component Value Date   HGBA1C 6.2 (H) 02/11/2020   Lab Results  Component Value Date   INSULIN 11.7 02/11/2020   Lab Results  Component Value Date   TSH 2.110 02/11/2020   Lab Results  Component Value Date   CHOL 174 02/11/2020   HDL 63 02/11/2020   LDLCALC 94 02/11/2020   TRIG 96 02/11/2020   CHOLHDL 2.2 12/26/2019   Lab Results  Component Value Date   WBC 7.7 02/11/2020   HGB 13.8 02/11/2020   HCT 41.6 02/11/2020   MCV 91 02/11/2020   PLT 334 02/11/2020   Lab Results  Component Value Date   IRON 69 12/15/2016   TIBC 296 12/15/2016   FERRITIN 42 12/15/2016   Attestation Statements:   Reviewed by clinician on day of visit: allergies, medications, problem list, medical history, surgical history, family history, social history, and previous encounter notes.   Wilhemena Durie, am acting as transcriptionist for [employer], PA-C.  I have reviewed the above documentation for accuracy and completeness, and I agree with the above. Abby Potash, PA-C

## 2020-03-13 ENCOUNTER — Ambulatory Visit
Admission: RE | Admit: 2020-03-13 | Discharge: 2020-03-13 | Disposition: A | Discharge status: Home / Self Care | Payer: BC Managed Care – PPO | Source: Ambulatory Visit | Attending: Gastroenterology | Admitting: Gastroenterology

## 2020-03-13 DIAGNOSIS — K7689 Other specified diseases of liver: Secondary | ICD-10-CM | POA: Diagnosis not present

## 2020-03-13 DIAGNOSIS — K824 Cholesterolosis of gallbladder: Secondary | ICD-10-CM | POA: Diagnosis not present

## 2020-03-13 DIAGNOSIS — R7989 Other specified abnormal findings of blood chemistry: Secondary | ICD-10-CM

## 2020-03-23 ENCOUNTER — Encounter (INDEPENDENT_AMBULATORY_CARE_PROVIDER_SITE_OTHER): Payer: Self-pay | Admitting: Family Medicine

## 2020-03-23 ENCOUNTER — Other Ambulatory Visit: Payer: Self-pay

## 2020-03-23 ENCOUNTER — Ambulatory Visit (INDEPENDENT_AMBULATORY_CARE_PROVIDER_SITE_OTHER): Payer: BC Managed Care – PPO | Admitting: Family Medicine

## 2020-03-23 VITALS — BP 104/70 | HR 80 | Temp 98.2°F | Ht 62.0 in | Wt 176.0 lb

## 2020-03-23 DIAGNOSIS — E669 Obesity, unspecified: Secondary | ICD-10-CM | POA: Diagnosis not present

## 2020-03-23 DIAGNOSIS — Z6832 Body mass index (BMI) 32.0-32.9, adult: Secondary | ICD-10-CM | POA: Diagnosis not present

## 2020-03-23 DIAGNOSIS — E66811 Obesity, class 1: Secondary | ICD-10-CM

## 2020-03-23 DIAGNOSIS — Z9189 Other specified personal risk factors, not elsewhere classified: Secondary | ICD-10-CM | POA: Diagnosis not present

## 2020-03-23 DIAGNOSIS — E559 Vitamin D deficiency, unspecified: Secondary | ICD-10-CM

## 2020-03-23 DIAGNOSIS — R7303 Prediabetes: Secondary | ICD-10-CM | POA: Diagnosis not present

## 2020-03-23 MED ORDER — VITAMIN D (ERGOCALCIFEROL) 1.25 MG (50000 UNIT) PO CAPS: 50000.0000 [IU] | ORAL_CAPSULE | ORAL | 0 refills | Status: DC

## 2020-03-23 MED ORDER — METFORMIN HCL ER 500 MG PO TB24: 500.0000 mg | ORAL_TABLET | Freq: Every day | ORAL | 0 refills | Status: DC

## 2020-03-26 DIAGNOSIS — K760 Fatty (change of) liver, not elsewhere classified: Secondary | ICD-10-CM | POA: Diagnosis not present

## 2020-03-26 DIAGNOSIS — K219 Gastro-esophageal reflux disease without esophagitis: Secondary | ICD-10-CM | POA: Diagnosis not present

## 2020-03-26 DIAGNOSIS — R1031 Right lower quadrant pain: Secondary | ICD-10-CM | POA: Diagnosis not present

## 2020-03-26 DIAGNOSIS — R748 Abnormal levels of other serum enzymes: Secondary | ICD-10-CM | POA: Diagnosis not present

## 2020-03-26 NOTE — Progress Notes (Signed)
Chief Complaint:   OBESITY Samantha Andersen is here to discuss her progress with her obesity treatment plan along with follow-up of her obesity related diagnoses. Samantha Andersen is on the Category 2 Plan and states she is following her eating plan approximately 95% of the time. Samantha Andersen states she is doing 0 minutes 0 times per week.  Today's visit was #: 4 Starting weight: 186 lbs Starting date: 02/11/2020 Today's weight: 176 lbs Today's date: 03/23/2020 Total lbs lost to date: 10 Total lbs lost since last in-office visit: 1  Interim History: [name] continues to do well with weight loss. She is getting ready to do a lot of traveling to be with family members who are going through health issues. She would like to discuss traveling options.  Subjective:   1. Vitamin D deficiency Samantha Andersen is stable on Vit D, and she requests a refill today.  2. Pre-diabetes Samantha Andersen is stable on metformin. She requests a refill early, as she will be traveling and will run out early.  3. At risk for heart disease Samantha Andersen is at a higher than average risk for cardiovascular disease due to obesity.   Assessment/Plan:   1. Vitamin D deficiency Low Vitamin D level contributes to fatigue and are associated with obesity, breast, and colon cancer. We will refill prescription Vitamin D for 1 month. Samantha Andersen will follow-up for routine testing of Vitamin D, at least 2-3 times per year to avoid over-replacement.  - Vitamin D, Ergocalciferol, (DRISDOL) 1.25 MG (50000 UNIT) CAPS capsule; Take 1 capsule (50,000 Units total) by mouth every 7 (seven) days.  Dispense: 4 capsule; Refill: 0  2. Pre-diabetes Samantha Andersen will continue to work on weight loss, exercise, and decreasing simple carbohydrates to help decrease the risk of diabetes. We will refill metformin for 2 months.  - metFORMIN (GLUCOPHAGE XR) 500 MG 24 hr tablet; Take 1 tablet (500 mg total) by mouth daily with breakfast.  Dispense: 60 tablet; Refill: 0  3. At risk for heart disease Samantha Andersen  was given approximately 15 minutes of coronary artery disease prevention counseling today. She is 55 y.o. female and has risk factors for heart disease including obesity. We discussed intensive lifestyle modifications today with an emphasis on specific weight loss instructions and strategies.   Repetitive spaced learning was employed today to elicit superior memory formation and behavioral change.  4. Class 1 obesity with serious comorbidity and body mass index (BMI) of 32.0 to 32.9 in adult, unspecified obesity type Samantha Andersen is currently in the action stage of change. As such, her goal is to continue with weight loss efforts. She has agreed to the Category 2 Plan or the [place].   Behavioral modification strategies: increasing lean protein intake and travel eating strategies.  Samantha Andersen has agreed to follow-up with our clinic in 4 weeks. She was informed of the importance of frequent follow-up visits to maximize her success with intensive lifestyle modifications for her multiple health conditions.   Objective:   Blood pressure 104/70, pulse 80, temperature 98.2 F (36.8 C), height 5\' 2"  (1.575 m), weight 176 lb (79.8 kg), SpO2 96 %. Body mass index is 32.19 kg/m.  General: Cooperative, alert, well developed, in no acute distress. HEENT: Conjunctivae and lids unremarkable. Cardiovascular: Regular rhythm.  Lungs: Normal work of breathing. Neurologic: No focal deficits.   Lab Results  Component Value Date   CREATININE 0.76 02/11/2020   BUN 11 02/11/2020   NA 140 02/11/2020   K 4.5 02/11/2020   CL 103 02/11/2020  CO2 24 02/11/2020   Lab Results  Component Value Date   ALT 41 (H) 02/11/2020   AST 34 02/11/2020   ALKPHOS 59 02/11/2020   BILITOT 0.3 02/11/2020   Lab Results  Component Value Date   HGBA1C 6.2 (H) 02/11/2020   Lab Results  Component Value Date   INSULIN 11.7 02/11/2020   Lab Results  Component Value Date   TSH 2.110 02/11/2020   Lab Results  Component  Value Date   CHOL 174 02/11/2020   HDL 63 02/11/2020   LDLCALC 94 02/11/2020   TRIG 96 02/11/2020   CHOLHDL 2.2 12/26/2019   Lab Results  Component Value Date   WBC 7.7 02/11/2020   HGB 13.8 02/11/2020   HCT 41.6 02/11/2020   MCV 91 02/11/2020   PLT 334 02/11/2020   Lab Results  Component Value Date   IRON 69 12/15/2016   TIBC 296 12/15/2016   FERRITIN 42 12/15/2016   Attestation Statements:   Reviewed by clinician on day of visit: allergies, medications, problem list, medical history, surgical history, family history, social history, and previous encounter notes.   I, Trixie Dredge, am acting as transcriptionist for Dennard Nip, MD.  I have reviewed the above documentation for accuracy and completeness, and I agree with the above. -  Dennard Nip, MD

## 2020-04-06 ENCOUNTER — Ambulatory Visit (INDEPENDENT_AMBULATORY_CARE_PROVIDER_SITE_OTHER): Payer: BC Managed Care – PPO | Admitting: Physician Assistant

## 2020-04-23 ENCOUNTER — Other Ambulatory Visit: Payer: Self-pay

## 2020-04-23 ENCOUNTER — Encounter (INDEPENDENT_AMBULATORY_CARE_PROVIDER_SITE_OTHER): Payer: Self-pay | Admitting: Family Medicine

## 2020-04-23 ENCOUNTER — Ambulatory Visit (INDEPENDENT_AMBULATORY_CARE_PROVIDER_SITE_OTHER): Payer: BC Managed Care – PPO | Admitting: Family Medicine

## 2020-04-23 VITALS — BP 100/66 | HR 80 | Temp 98.2°F | Ht 62.0 in | Wt 174.0 lb

## 2020-04-23 DIAGNOSIS — Z9189 Other specified personal risk factors, not elsewhere classified: Secondary | ICD-10-CM | POA: Diagnosis not present

## 2020-04-23 DIAGNOSIS — Z6834 Body mass index (BMI) 34.0-34.9, adult: Secondary | ICD-10-CM

## 2020-04-23 DIAGNOSIS — K760 Fatty (change of) liver, not elsewhere classified: Secondary | ICD-10-CM

## 2020-04-23 DIAGNOSIS — E559 Vitamin D deficiency, unspecified: Secondary | ICD-10-CM

## 2020-04-23 DIAGNOSIS — E669 Obesity, unspecified: Secondary | ICD-10-CM | POA: Diagnosis not present

## 2020-04-23 MED ORDER — VITAMIN D (ERGOCALCIFEROL) 1.25 MG (50000 UNIT) PO CAPS
50000.0000 [IU] | ORAL_CAPSULE | ORAL | 0 refills | Status: DC
Start: 2020-04-23 — End: 2020-05-12

## 2020-04-30 NOTE — Progress Notes (Signed)
Chief Complaint:   OBESITY Danicia is here to discuss her progress with her obesity treatment plan along with follow-up of her obesity related diagnoses. Dannisha is on the Category 2 Plan or the [place] and states she is following her eating plan approximately 40% of the time. Lorien states she is walking 4 miles and on Sunday for 20 minutes 7 times per week.  Today's visit was #: 5 Starting weight: 186 lbs Starting date: 02/11/2020 Today's weight: 174 lbs Today's date: 04/23/2020 Total lbs lost to date: 12 Total lbs lost since last in-office visit: 2  Interim History: Gabbie has had a lot of stress with family illness, traveling, and her father in-law's death. She has not been able to follow her plan.  Subjective:   1. Vitamin D deficiency Quinta is stable on Vit D, and her level is not yet at goal.  2. NAFLD (nonalcoholic fatty liver disease) I reviewed ultrasound results with Madgie, and discussed non-alcoholic fatty liver disease as well as the importance of both diet and weight to improve. She denies abdominal pain or jaundice. Her LFTs are mildly elevated. I discussed labs with the patient today.  3. At risk for impaired metabolic function Shiesha is at increased risk for impaired metabolic function due to current nutrition and muscle mass.  Assessment/Plan:   1. Vitamin D deficiency Low Vitamin D level contributes to fatigue and are associated with obesity, breast, and colon cancer. We will refill prescription Vitamin D for 1 month. Glory will follow-up for routine testing of Vitamin D, at least 2-3 times per year to avoid over-replacement.  - Vitamin D, Ergocalciferol, (DRISDOL) 1.25 MG (50000 UNIT) CAPS capsule; Take 1 capsule (50,000 Units total) by mouth every 7 (seven) days.  Dispense: 4 capsule; Refill: 0  2. NAFLD (nonalcoholic fatty liver disease) We discussed the likely diagnosis of non-alcoholic fatty liver disease today and how this condition is obesity related.  Andriana was educated the importance of weight loss. Brieonna agreed to continue with her weight loss efforts with healthier diet and exercise as an essential part of her treatment plan.  3. At risk for impaired metabolic function Annelies was given approximately 15 minutes of impaired  metabolic function prevention counseling today. We discussed intensive lifestyle modifications today with an emphasis on specific nutrition and exercise instructions and strategies.   Repetitive spaced learning was employed today to elicit superior memory formation and behavioral change.  4. Obesity with current BMI of 31.9 Takiya is currently in the action stage of change. As such, her goal is to continue with weight loss efforts. She has agreed to the Category 2 Plan.   Protein oatmeal recipes were given today.  Exercise goals: As is.  Behavioral modification strategies: increasing lean protein intake.  Junice has agreed to follow-up with our clinic in 2 to 3 weeks. She was informed of the importance of frequent follow-up visits to maximize her success with intensive lifestyle modifications for her multiple health conditions.   Objective:   Blood pressure 100/66, pulse 80, temperature 98.2 F (36.8 C), height 5\' 2"  (1.575 m), weight 174 lb (78.9 kg), SpO2 96 %. Body mass index is 31.83 kg/m.  General: Cooperative, alert, well developed, in no acute distress. HEENT: Conjunctivae and lids unremarkable. Cardiovascular: Regular rhythm.  Lungs: Normal work of breathing. Neurologic: No focal deficits.   Lab Results  Component Value Date   CREATININE 0.76 02/11/2020   BUN 11 02/11/2020   NA 140 02/11/2020   K  4.5 02/11/2020   CL 103 02/11/2020   CO2 24 02/11/2020   Lab Results  Component Value Date   ALT 41 (H) 02/11/2020   AST 34 02/11/2020   ALKPHOS 59 02/11/2020   BILITOT 0.3 02/11/2020   Lab Results  Component Value Date   HGBA1C 6.2 (H) 02/11/2020   Lab Results  Component Value Date   INSULIN  11.7 02/11/2020   Lab Results  Component Value Date   TSH 2.110 02/11/2020   Lab Results  Component Value Date   CHOL 174 02/11/2020   HDL 63 02/11/2020   LDLCALC 94 02/11/2020   TRIG 96 02/11/2020   CHOLHDL 2.2 12/26/2019   Lab Results  Component Value Date   WBC 7.7 02/11/2020   HGB 13.8 02/11/2020   HCT 41.6 02/11/2020   MCV 91 02/11/2020   PLT 334 02/11/2020   Lab Results  Component Value Date   IRON 69 12/15/2016   TIBC 296 12/15/2016   FERRITIN 42 12/15/2016   Attestation Statements:   Reviewed by clinician on day of visit: allergies, medications, problem list, medical history, surgical history, family history, social history, and previous encounter notes.   I, Trixie Dredge, am acting as transcriptionist for Dennard Nip, MD.  I have reviewed the above documentation for accuracy and completeness, and I agree with the above. -  Dennard Nip, MD

## 2020-05-01 ENCOUNTER — Other Ambulatory Visit (INDEPENDENT_AMBULATORY_CARE_PROVIDER_SITE_OTHER): Payer: Self-pay | Admitting: Physician Assistant

## 2020-05-01 DIAGNOSIS — R7303 Prediabetes: Secondary | ICD-10-CM

## 2020-05-04 NOTE — Telephone Encounter (Signed)
Last seen [facility]

## 2020-05-06 NOTE — Telephone Encounter (Signed)
Ok x 1

## 2020-05-07 DIAGNOSIS — L910 Hypertrophic scar: Secondary | ICD-10-CM | POA: Diagnosis not present

## 2020-05-07 DIAGNOSIS — L249 Irritant contact dermatitis, unspecified cause: Secondary | ICD-10-CM | POA: Diagnosis not present

## 2020-05-07 DIAGNOSIS — L814 Other melanin hyperpigmentation: Secondary | ICD-10-CM | POA: Diagnosis not present

## 2020-05-12 ENCOUNTER — Encounter (INDEPENDENT_AMBULATORY_CARE_PROVIDER_SITE_OTHER): Payer: Self-pay | Admitting: Family Medicine

## 2020-05-12 ENCOUNTER — Other Ambulatory Visit: Payer: Self-pay

## 2020-05-12 ENCOUNTER — Ambulatory Visit (INDEPENDENT_AMBULATORY_CARE_PROVIDER_SITE_OTHER): Payer: BC Managed Care – PPO | Admitting: Family Medicine

## 2020-05-12 VITALS — BP 106/75 | HR 70 | Temp 97.6°F | Ht 62.0 in | Wt 173.0 lb

## 2020-05-12 DIAGNOSIS — E669 Obesity, unspecified: Secondary | ICD-10-CM

## 2020-05-12 DIAGNOSIS — Z6834 Body mass index (BMI) 34.0-34.9, adult: Secondary | ICD-10-CM | POA: Diagnosis not present

## 2020-05-12 DIAGNOSIS — Z9189 Other specified personal risk factors, not elsewhere classified: Secondary | ICD-10-CM

## 2020-05-12 DIAGNOSIS — E559 Vitamin D deficiency, unspecified: Secondary | ICD-10-CM | POA: Diagnosis not present

## 2020-05-12 MED ORDER — VITAMIN D (ERGOCALCIFEROL) 1.25 MG (50000 UNIT) PO CAPS: 50000.0000 [IU] | ORAL_CAPSULE | ORAL | 0 refills | Status: DC

## 2020-05-13 NOTE — Progress Notes (Signed)
Chief Complaint:   OBESITY Samantha Andersen is here to discuss her progress with her obesity treatment plan along with follow-up of her obesity related diagnoses. Samantha Andersen is on the Category 2 Plan and states she is following her eating plan approximately 70% of the time. Samantha Andersen states she is moving around a lot for a few hours 7 times per week.   Today's visit was #: 6 Starting weight: 186 lbs Starting date: 02/11/2020 Today's weight: 173 lbs Today's date: 05/12/2020 Total lbs lost to date: 13 Total lbs lost since last in-office visit: 1  Interim History: Samantha Andersen has had a lot of stress recently with 2 funerals and her sister's cancer treatments. She has been very active but unable to plan as well. She still lost weight though and she has remained mindful of her food choices.  Subjective:   1. Vitamin D deficiency Samantha Andersen is stable on Vit D, but her level is not yet at goal. She denies nausea, vomiting, or muscle weakness.  2. At risk for impaired metabolic function Samantha Andersen is at increased risk for impaired metabolic function if protein decreases.  Assessment/Plan:   1. Vitamin D deficiency Low Vitamin D level contributes to fatigue and are associated with obesity, breast, and colon cancer. We will refill prescription Vitamin D for 1 month. Darion will follow-up for routine testing of Vitamin D, at least 2-3 times per year to avoid over-replacement.  - Vitamin D, Ergocalciferol, (DRISDOL) 1.25 MG (50000 UNIT) CAPS capsule; Take 1 capsule (50,000 Units total) by mouth every 7 (seven) days.  Dispense: 4 capsule; Refill: 0  2. At risk for impaired metabolic function Samantha Andersen was given approximately 15 minutes of impaired  metabolic function prevention counseling today. We discussed intensive lifestyle modifications today with an emphasis on specific nutrition and exercise instructions and strategies.   Repetitive spaced learning was employed today to elicit superior memory formation and behavioral  change.  3. Obesity with current BMI of 31.8 Samantha Andersen is currently in the action stage of change. As such, her goal is to continue with weight loss efforts. She has agreed to the Category 2 Plan.   Exercise goals: As is.  Behavioral modification strategies: increasing lean protein intake.  Samantha Andersen has agreed to follow-up with our clinic in 2 to 3 weeks. She was informed of the importance of frequent follow-up visits to maximize her success with intensive lifestyle modifications for her multiple health conditions.   Objective:   Blood pressure 106/75, pulse 70, temperature 97.6 F (36.4 C), height 5\' 2"  (1.575 m), weight 173 lb (78.5 kg), SpO2 98 %. Body mass index is 31.64 kg/m.  General: Cooperative, alert, well developed, in no acute distress. HEENT: Conjunctivae and lids unremarkable. Cardiovascular: Regular rhythm.  Lungs: Normal work of breathing. Neurologic: No focal deficits.   Lab Results  Component Value Date   CREATININE 0.76 02/11/2020   BUN 11 02/11/2020   NA 140 02/11/2020   K 4.5 02/11/2020   CL 103 02/11/2020   CO2 24 02/11/2020   Lab Results  Component Value Date   ALT 41 (H) 02/11/2020   AST 34 02/11/2020   ALKPHOS 59 02/11/2020   BILITOT 0.3 02/11/2020   Lab Results  Component Value Date   HGBA1C 6.2 (H) 02/11/2020   Lab Results  Component Value Date   INSULIN 11.7 02/11/2020   Lab Results  Component Value Date   TSH 2.110 02/11/2020   Lab Results  Component Value Date   CHOL 174 02/11/2020  HDL 63 02/11/2020   LDLCALC 94 02/11/2020   TRIG 96 02/11/2020   CHOLHDL 2.2 12/26/2019   Lab Results  Component Value Date   WBC 7.7 02/11/2020   HGB 13.8 02/11/2020   HCT 41.6 02/11/2020   MCV 91 02/11/2020   PLT 334 02/11/2020   Lab Results  Component Value Date   IRON 69 12/15/2016   TIBC 296 12/15/2016   FERRITIN 42 12/15/2016   Attestation Statements:   Reviewed by clinician on day of visit: allergies, medications, problem list,  medical history, surgical history, family history, social history, and previous encounter notes.   I, Trixie Dredge, am acting as transcriptionist for Dennard Nip, MD.  I have reviewed the above documentation for accuracy and completeness, and I agree with the above. -  Dennard Nip, MD

## 2020-05-28 ENCOUNTER — Other Ambulatory Visit: Payer: Self-pay

## 2020-05-28 ENCOUNTER — Encounter (INDEPENDENT_AMBULATORY_CARE_PROVIDER_SITE_OTHER): Payer: Self-pay | Admitting: Family Medicine

## 2020-05-28 ENCOUNTER — Ambulatory Visit (INDEPENDENT_AMBULATORY_CARE_PROVIDER_SITE_OTHER): Payer: BC Managed Care – PPO | Admitting: Family Medicine

## 2020-05-28 VITALS — BP 108/73 | HR 82 | Temp 98.0°F | Ht 62.0 in | Wt 175.0 lb

## 2020-05-28 DIAGNOSIS — E559 Vitamin D deficiency, unspecified: Secondary | ICD-10-CM | POA: Diagnosis not present

## 2020-05-28 DIAGNOSIS — Z6832 Body mass index (BMI) 32.0-32.9, adult: Secondary | ICD-10-CM

## 2020-05-28 DIAGNOSIS — E669 Obesity, unspecified: Secondary | ICD-10-CM | POA: Diagnosis not present

## 2020-05-28 DIAGNOSIS — R7303 Prediabetes: Secondary | ICD-10-CM | POA: Insufficient documentation

## 2020-05-28 DIAGNOSIS — Z9189 Other specified personal risk factors, not elsewhere classified: Secondary | ICD-10-CM | POA: Diagnosis not present

## 2020-05-28 MED ORDER — METFORMIN HCL ER 500 MG PO TB24: ORAL_TABLET | ORAL | 0 refills | Status: DC

## 2020-05-28 MED ORDER — VITAMIN D (ERGOCALCIFEROL) 1.25 MG (50000 UNIT) PO CAPS: 50000.0000 [IU] | ORAL_CAPSULE | ORAL | 0 refills | Status: DC

## 2020-06-03 NOTE — Progress Notes (Signed)
Chief Complaint:   OBESITY Samantha Andersen is here to discuss her progress with her obesity treatment plan along with follow-up of her obesity related diagnoses.   Today's visit was #: 7 Starting weight: 186 lbs Starting date: 02/11/2020 Today's weight: 175 lbs Today's date: 05/28/2020 Weight change since last visit: +2 lbs Total lbs lost to date: 11 lbs Body mass index is 32.01 kg/m.  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  Little time for self.  Current Meal Plan: the Category 2 Plan for 40% of the time.  Current Exercise Plan: Walking for 15-20 minutes 7 times per week.  Assessment/Plan:   Medications Discontinued During This Encounter  Medication Reason  . metFORMIN (GLUCOPHAGE-XR) 500 MG 24 hr tablet Reorder  . Vitamin D, Ergocalciferol, (DRISDOL) 1.25 MG (50000 UNIT) CAPS capsule Reorder    Meds ordered this encounter  Medications  . metFORMIN (GLUCOPHAGE-XR) 500 MG 24 hr tablet    Sig: 1 po bid    Dispense:  60 tablet    Refill:  0  . Vitamin D, Ergocalciferol, (DRISDOL) 1.25 MG (50000 UNIT) CAPS capsule    Sig: Take 1 capsule (50,000 Units total) by mouth every 7 (seven) days.    Dispense:  4 capsule    Refill:  0   1. Pre-diabetes Not at goal. Goal is HgbA1c < 5.7.  Medication: metformin 500 mg daily.    Plan:  Increase metformin to 500 mg twice daily.  Even though it is a 24 hour tablet, she feels that it wars off in the afternoons.  She will continue to focus on protein-rich, low simple carbohydrate foods. We reviewed the importance of hydration, regular exercise for stress reduction, and restorative sleep.   Lab Results  Component Value Date   HGBA1C 6.2 (H) 02/11/2020   Lab Results  Component Value Date   INSULIN 11.7 02/11/2020   - Increase and refill metFORMIN (GLUCOPHAGE-XR) 500 MG 24 hr tablet; 1 po bid  Dispense: 60 tablet; Refill:  0  2. Vitamin D deficiency Not at goal. Current vitamin D is 31.4, tested on 02/11/2020. Optimal goal > 50 ng/dL.  She is taking vitamin D 50,000 IU weekly.  Plan: Continue to take prescription Vitamin D @50 ,000 IU every week as prescribed.  Follow-up for routine testing of Vitamin D, at least 2-3 times per year to avoid over-replacement.  - Refill Vitamin D, Ergocalciferol, (DRISDOL) 1.25 MG (50000 UNIT) CAPS capsule; Take 1 capsule (50,000 Units total) by mouth every 7 (seven) days.  Dispense: 4 capsule; Refill: 0  3. At risk for side effect of medication Due to Samantha Andersen's current conditions and medications, and increasing her metformin dose, she is at a higher risk for drug side effect.  At least 9 minutes was spent on counseling her about these concerns today.  We discussed the benefits and potential risks of these medications, and all of patient's concerns were addressed and questions were answered.  she will call us, or their PCP or other specialists who treat their conditions with medications, with any questions or concerns that may develop.    4. Obesity, current BMI 32.2  Course: Samantha Andersen is currently in the action stage of change. As such, her goal is to continue with weight loss efforts.   Nutrition goals: She has agreed to practicing portion control and making smarter food choices, such as increasing vegetables  and decreasing simple carbohydrates.   Exercise goals: As is but increase exercise to aid with stress management.  Behavioral modification strategies: increasing lean protein intake, decreasing simple carbohydrates and increasing water intake.  Samantha Andersen has agreed to follow-up with our clinic in 2-3 weeks. She was informed of the importance of frequent follow-up visits to maximize her success with intensive lifestyle modifications for her multiple health conditions.   Objective:   Blood pressure 108/73, pulse 82, temperature 98 F (36.7 C), height 5\' 2"  (1.575 m), weight 175 lb  (79.4 kg), SpO2 98 %. Body mass index is 32.01 kg/m.  General: Cooperative, alert, well developed, in no acute distress. HEENT: Conjunctivae and lids unremarkable. Cardiovascular: Regular rhythm.  Lungs: Normal work of breathing. Neurologic: No focal deficits.   Lab Results  Component Value Date   CREATININE 0.76 02/11/2020   BUN 11 02/11/2020   NA 140 02/11/2020   K 4.5 02/11/2020   CL 103 02/11/2020   CO2 24 02/11/2020   Lab Results  Component Value Date   ALT 41 (H) 02/11/2020   AST 34 02/11/2020   ALKPHOS 59 02/11/2020   BILITOT 0.3 02/11/2020   Lab Results  Component Value Date   HGBA1C 6.2 (H) 02/11/2020   Lab Results  Component Value Date   INSULIN 11.7 02/11/2020   Lab Results  Component Value Date   TSH 2.110 02/11/2020   Lab Results  Component Value Date   CHOL 174 02/11/2020   HDL 63 02/11/2020   LDLCALC 94 02/11/2020   TRIG 96 02/11/2020   CHOLHDL 2.2 12/26/2019   Lab Results  Component Value Date   WBC 7.7 02/11/2020   HGB 13.8 02/11/2020   HCT 41.6 02/11/2020   MCV 91 02/11/2020   PLT 334 02/11/2020   Lab Results  Component Value Date   IRON 69 12/15/2016   TIBC 296 12/15/2016   FERRITIN 42 12/15/2016   Attestation Statements:   Reviewed by clinician on day of visit: allergies, medications, problem list, medical history, surgical history, family history, social history, and previous encounter notes.  I, Water quality scientist, CMA, am acting as Location manager for [employer], DO.  I have reviewed the above documentation for accuracy and completeness, and I agree with the above. Marjory Sneddon, D.O.  The [place] was signed into law in 2016 which includes the topic of electronic health records.  This provides immediate access to information in MyChart.  This includes consultation notes, operative notes, office notes, lab results and pathology reports.  If you have any questions about what you read please let us know at  your next visit so we can discuss your concerns and take corrective action if need be.  We are right here with you.

## 2020-06-04 NOTE — Progress Notes (Signed)
Cardiology Office Note:    Date:  06/05/2020   ID:  Samantha Andersen, DOB 01-18-1965, MRN [id number]  PCP:  Louretta Shorten, MD   [facility] [place] [facility] Providers Cardiologist:  None {    Referring MD: Louretta Shorten, MD     History of Present Illness:    Samantha Andersen is a 55 y.o. female with a hx of breast CA, PCOS, anemia, anxiety, HLD, celiac disease, ankylosing spondylitis, inappropriate sinus tach who was previously followed by Dr. Meda Coffee and now returns to clinic for follow-up of inappropriate sinus tach.  Per review of the record, the patient was historically followed by Dr. Meda Coffee for concerns of frequent tachycardias with 1 episode observed during diagnostic colonoscopy. 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 Stress echo 2015 was normal. 48-hour monitor 09/2014 showed inappropriate sinus tachycardia for which carvedilol was started. She did not tolerate this nor metoprolol so was switched to diltiazem. She has not tolerated multiple statins and has been on red yeast rice. Coronary CTA 05/2015 showed calcium score of zero, mild nonobstructive CAD (0-25% prox RCA, 0-25% prox LAD).   Last saw Dr Meda Coffee on 05/07/19 where she was doing better from a CV standpoint. She continued to have fast HR but no palpitations.  Today, the patient states she feels overall well. Has been having dizziness for the past week that has been ongoing all day. Symptoms are not worse with position changes. No LOC. Has also started menopause and has been having hot flashes. No chest pain, orthopnea, PND, LE edema. Has chronic dyspnea on exertion which has been ongoing. Occasional palpitations. Blood pressure is well controlled. Remains very active and walks a 5K everyday.  Past Medical History:  Diagnosis Date  . Anemia   . Anxiety   . B12 deficiency   . Back pain   . Breast cancer ([place])    left  . Cancer ([place])   . Celiac disease   . Complication  of anesthesia    tachycardic, also reports that she needed increased anesth. because she has a high threshold for medicine/sedation  . Esophagitis   . GERD (gastroesophageal reflux disease)   . Goiter   . 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 Hemochromatosis carrier   . History of gestational diabetes   . Hot flashes   . Hyperlipidemia   . Inappropriate sinus tachycardia   . Infertility associated with anovulation   . Joint pain   . Mild CAD    a. mild nonobstructive CAD (0-25% prox RCA, 0-25% prox LAD).   Marland Kitchen Other calcification of muscle, unspecified ankle and foot   . Other fatigue   . PCOS (polycystic ovarian syndrome)   . PCOS (polycystic ovarian syndrome)   . PONV (postoperative nausea and vomiting)    also reports N&V accompanies anxiety  . Shortness of breath on exertion   . Skin cancer   . Spondyloarthritis   . Swallowing difficulty   . Vitamin D deficiency     Past Surgical History:  Procedure Laterality Date  . ABDOMINAL HYSTERECTOMY    . CESAREAN SECTION     x2  . MASTECTOMY Bilateral   . SIMPLE MASTECTOMY WITH AXILLARY SENTINEL NODE BIOPSY  11/24/2011   Procedure: SIMPLE MASTECTOMY WITH AXILLARY SENTINEL NODE BIOPSY;  Surgeon: Haywood Lasso, MD;  Location: [place];  Service: General;  Laterality: Left;  Bilateral Total Mastectomy and Left Sentinel Node  . SIMPLE MASTECTOMY WITH AXILLARY SENTINEL  NODE BIOPSY  11/24/2011   Procedure: SIMPLE MASTECTOMY;  Surgeon: Haywood Lasso, MD;  Location: [place];  Service: General;  Laterality: Right;  . TISSUE EXPANDER PLACEMENT  11/24/2011   Procedure: TISSUE EXPANDER;  Surgeon: Crissie Reese, MD;  Location: [facility];  Service: Plastics;  Laterality: Bilateral;    Current Medications: Current Meds  Medication Sig  . acetaminophen (TYLENOL) 325 MG tablet Take 650 mg by mouth as needed.  . Bempedoic Acid-Ezetimibe (NEXLIZET) 180-10 MG TABS Take 1 tablet by mouth every other day.  . Calcium Carbonate-Vitamin D3 600-400  MG-UNIT TABS Take 1 tablet by mouth daily.  . Coenzyme Q10 (COQ10) 50 G POWD Take 100 g by mouth daily.  Marland Kitchen dexlansoprazole (DEXILANT) 60 MG capsule Take 60 mg by mouth daily.  . metFORMIN (GLUCOPHAGE-XR) 500 MG 24 hr tablet 1 po bid  . triamcinolone cream (KENALOG) 0.1 % Apply topically 2 (two) times daily as needed.  . Turmeric (QC TUMERIC COMPLEX PO) Take 1,500 mg by mouth daily.  . Vitamin D, Ergocalciferol, (DRISDOL) 1.25 MG (50000 UNIT) CAPS capsule Take 1 capsule (50,000 Units total) by mouth every 7 (seven) days.  Marland Kitchen VITAMIN D, ERGOCALCIFEROL, PO Take 1,000 Units by mouth daily.     Allergies:   Doxycycline, Gluten meal, Other, Tetracyclines & related, Metformin and related, and Statins   Social History   Socioeconomic History  . Marital status: Married    Spouse name: david  . Number of children: 2  . Years of education: Not on file  . Highest education level: Not on file  Occupational History  . Occupation: clergy  Tobacco Use  . Smoking status: Former Smoker    Packs/day: 2.00    Years: 4.00    Pack years: 8.00    Quit date: 10/04/1985    Years since quitting: 34.6  . Smokeless tobacco: Never Used  Vaping Use  . Vaping Use: Never used  Substance and Sexual Activity  . Alcohol use: Yes    Comment: rare  . Drug use: No  . Sexual activity: Yes    Partners: Male    Birth control/protection: None  Other Topics Concern  . Not on file  Social History Narrative  . Not on file   Social Determinants of Health   Financial Resource Strain: Not on file  Food Insecurity: Not on file  Transportation Needs: Not on file  Physical Activity: Not on file  Stress: Not on file  Social Connections: Not on file     Family History: The patient's family history includes Cancer in her mother; Cancer (age of onset: 109) in her paternal uncle; Depression in her father; Healthy in her father; Heart disease in her father; Hemochromatosis in her sister; Hyperlipidemia in her father and  mother; Lung cancer in her maternal grandfather; Prostate cancer (age of onset: 82) in her paternal uncle; Sleep apnea in her father.  ROS:   Please see the history of present illness.    Review of Systems  Constitutional: Negative for chills and fever.  HENT: Negative for sore throat.   Eyes: Negative for blurred vision.  Respiratory: Positive for shortness of breath.   Cardiovascular: Positive for chest pain and palpitations. Negative for orthopnea, claudication, leg swelling and PND.  Gastrointestinal: Negative for nausea and vomiting.  Genitourinary: Negative for flank pain.  Musculoskeletal: Negative for falls.  Neurological: Positive for dizziness. Negative for loss of consciousness.  Psychiatric/Behavioral: Negative for substance abuse.    EKGs/Labs/Other Studies Reviewed:  The following studies were reviewed today:  ETT 01/2017:  Blood pressure demonstrated a normal response to exercise.  There was no ST segment deviation noted during stress.  No T wave inversion was noted during stress.   Normal ECG stress test.  DSE 03/2013: Study Conclusions   - HPI and indications: Resting chest pain.  - Stress ECG conclusions: The stress ECG was normal.  - Baseline: LV global systolic function was normal. The  estimated LV ejection fraction was 60%. Normal wall  motion; no LV regional wall motion abnormalities.  - Peak stress: LV global systolic function was vigorous.  Normal wall motion; no LV regional wall motion  abnormalities.  - Impressions: There is normal increase in thickening and  contractility in all segments. This is a normal stress  echo.  Impressions:   - There is normal increase in thickening and contractility  in all segments. This is a normal stress echo.    EKG:  EKG not performed.  Recent Labs: 02/11/2020: ALT 41; BUN 11; Creatinine, Ser 0.76; Hemoglobin 13.8; Platelets 334; Potassium 4.5; Sodium 140; TSH 2.110  Recent Lipid Panel     Component Value Date/Time   CHOL 174 02/11/2020 0901   CHOL 233 (H) 04/04/2014 1022   TRIG 96 02/11/2020 0901   TRIG 118 04/04/2014 1022   HDL 63 02/11/2020 0901   HDL 64 04/04/2014 1022   CHOLHDL 2.2 12/26/2019 0946   CHOLHDL 4 07/28/2014 0844   VLDL 24.6 07/28/2014 0844   LDLCALC 94 02/11/2020 0901   LDLCALC 145 (H) 04/04/2014 1022       Physical Exam:    VS:  BP 118/80   Pulse 81   Ht 5\' 2"  (1.575 m)   Wt 180 lb 9.6 oz (81.9 kg)   SpO2 95%   BMI 33.03 kg/m     Wt Readings from Last 3 Encounters:  06/05/20 180 lb 9.6 oz (81.9 kg)  05/28/20 175 lb (79.4 kg)  05/12/20 173 lb (78.5 kg)     GEN:  Well nourished, well developed in no acute distress HEENT: Normal NECK: No JVD; No carotid bruits CARDIAC: RRR, no murmurs, rubs, gallops RESPIRATORY:  Clear to auscultation without rales, wheezing or rhonchi  ABDOMEN: Soft, non-tender, non-distended MUSCULOSKELETAL:  No edema; No deformity  SKIN: Warm and dry NEUROLOGIC:  Alert and oriented x 3 PSYCHIATRIC:  Normal affect   ASSESSMENT:    1. Precordial pain   2. Myalgia due to statin   3. Pure hypercholesterolemia   4. Inappropriate sinus tachycardia   5. Dizziness    PLAN:    In order of problems listed above:  #Atypical Chest Pain:  Minimal disease on CTA in 2017 with Ca score 0. Normal ETT in 2019. Doing well with only occasional chest discomfort that is non-exertional in nature. Remains active and walks 75miles/day. -Continue diet and lifestyle modifications  #Inappropriate Sinus Tachycardia: HR currently 81. Did not tolerate BB in the past. Only occasional palpitations -Doing well with no palpitations -Continue hydration -Continue daily exercise  #HLD: #Statin Intolerance: Intolerant to all statins including red yeast rice. LDL 94; given minimal disease on cath, okay for goal <100.  -Continue nexlizet 180/10mg  daily  #Dizziness: Has been ongoing for the past week. No positional related or worsened  by movement of the head. Suspect vertigo. Blood pressure well controlled with no hypotension. HR 80s, -Follow-up with PCP for management   Medication Adjustments/Labs and Tests Ordered: Current medicines are reviewed at length with the patient today.  Concerns regarding medicines are outlined above.  No orders of the defined types were placed in this encounter.  No orders of the defined types were placed in this encounter.   Patient Instructions  Medication Instructions:  Your physician recommends that you continue on your current medications as directed. Please refer to the Current Medication list given to you today.  *If you need a refill on your cardiac medications before your next appointment, please call your pharmacy*   Lab Work: None If you have labs (blood work) drawn today and your tests are completely normal, you will receive your results only by: Marland Kitchen MyChart Message (if you have MyChart) OR . A paper copy in the mail If you have any lab test that is abnormal or we need to change your treatment, we will call you to review the results.   Testing/Procedures: None   Follow-Up: At [facility], you and your health needs are our priority.  As part of our continuing mission to provide you with exceptional heart care, we have created designated Provider Care Teams.  These Care Teams include your primary Cardiologist (physician) and Advanced Practice Providers (APPs -  Physician Assistants and Nurse Practitioners) who all work together to provide you with the care you need, when you need it.  We recommend signing up for the patient portal called "MyChart".  Sign up information is provided on this After Visit Summary.  MyChart is used to connect with patients for Virtual Visits (Telemedicine).  Patients are able to view lab/test results, encounter notes, upcoming appointments, etc.  Non-urgent messages can be sent to your provider as well.   To learn more about what you can do with  MyChart, go to [email or web address].    Your next appointment:   1 year(s)  The format for your next appointment:   In Person  Provider:   You may see Dr. Johney Frame or one of the following Advanced Practice Providers on your designated Care Team:    Richardson Dopp, PA-C  Robbie Lis, [place]    Other Instructions      Signed, Freada Bergeron, MD  06/05/2020 8:48 AM    [place]

## 2020-06-05 ENCOUNTER — Other Ambulatory Visit: Payer: Self-pay

## 2020-06-05 ENCOUNTER — Ambulatory Visit: Payer: BC Managed Care – PPO | Admitting: Cardiology

## 2020-06-05 ENCOUNTER — Encounter: Payer: Self-pay | Admitting: Cardiology

## 2020-06-05 VITALS — BP 118/80 | HR 81 | Ht 62.0 in | Wt 180.6 lb

## 2020-06-05 DIAGNOSIS — T466X5A Adverse effect of antihyperlipidemic and antiarteriosclerotic drugs, initial encounter: Secondary | ICD-10-CM

## 2020-06-05 DIAGNOSIS — R42 Dizziness and giddiness: Secondary | ICD-10-CM

## 2020-06-05 DIAGNOSIS — E7800 Pure hypercholesterolemia, unspecified: Secondary | ICD-10-CM | POA: Diagnosis not present

## 2020-06-05 DIAGNOSIS — R000 Tachycardia, unspecified: Secondary | ICD-10-CM

## 2020-06-05 DIAGNOSIS — T466X5D Adverse effect of antihyperlipidemic and antiarteriosclerotic drugs, subsequent encounter: Secondary | ICD-10-CM

## 2020-06-05 DIAGNOSIS — R072 Precordial pain: Secondary | ICD-10-CM

## 2020-06-05 DIAGNOSIS — M791 Myalgia, unspecified site: Secondary | ICD-10-CM

## 2020-06-05 NOTE — Patient Instructions (Signed)
Medication Instructions:  Your physician recommends that you continue on your current medications as directed. Please refer to the Current Medication list given to you today.  *If you need a refill on your cardiac medications before your next appointment, please call your pharmacy*   Lab Work: None If you have labs (blood work) drawn today and your tests are completely normal, you will receive your results only by: Marland Kitchen MyChart Message (if you have MyChart) OR . A paper copy in the mail If you have any lab test that is abnormal or we need to change your treatment, we will call you to review the results.   Testing/Procedures: None   Follow-Up: At [facility], you and your health needs are our priority.  As part of our continuing mission to provide you with exceptional heart care, we have created designated Provider Care Teams.  These Care Teams include your primary Cardiologist (physician) and Advanced Practice Providers (APPs -  Physician Assistants and Nurse Practitioners) who all work together to provide you with the care you need, when you need it.  We recommend signing up for the patient portal called "MyChart".  Sign up information is provided on this After Visit Summary.  MyChart is used to connect with patients for Virtual Visits (Telemedicine).  Patients are able to view lab/test results, encounter notes, upcoming appointments, etc.  Non-urgent messages can be sent to your provider as well.   To learn more about what you can do with MyChart, go to [email or web address].    Your next appointment:   1 year(s)  The format for your next appointment:   In Person  Provider:   You may see Dr. Johney Frame or one of the following Advanced Practice Providers on your designated Care Team:    Richardson Dopp, PA-C  Robbie Lis, [place]    Other Instructions

## 2020-06-11 ENCOUNTER — Other Ambulatory Visit: Payer: Self-pay

## 2020-06-11 ENCOUNTER — Ambulatory Visit (INDEPENDENT_AMBULATORY_CARE_PROVIDER_SITE_OTHER): Payer: BC Managed Care – PPO | Admitting: Family Medicine

## 2020-06-11 ENCOUNTER — Encounter (INDEPENDENT_AMBULATORY_CARE_PROVIDER_SITE_OTHER): Payer: Self-pay | Admitting: Family Medicine

## 2020-06-11 VITALS — BP 105/71 | HR 78 | Temp 98.3°F | Ht 62.0 in | Wt 179.0 lb

## 2020-06-11 DIAGNOSIS — E559 Vitamin D deficiency, unspecified: Secondary | ICD-10-CM | POA: Diagnosis not present

## 2020-06-11 DIAGNOSIS — E669 Obesity, unspecified: Secondary | ICD-10-CM | POA: Diagnosis not present

## 2020-06-11 DIAGNOSIS — R7303 Prediabetes: Secondary | ICD-10-CM | POA: Diagnosis not present

## 2020-06-11 DIAGNOSIS — Z9189 Other specified personal risk factors, not elsewhere classified: Secondary | ICD-10-CM | POA: Insufficient documentation

## 2020-06-11 DIAGNOSIS — F5089 Other specified eating disorder: Secondary | ICD-10-CM

## 2020-06-11 DIAGNOSIS — Z6834 Body mass index (BMI) 34.0-34.9, adult: Secondary | ICD-10-CM

## 2020-06-11 DIAGNOSIS — F509 Eating disorder, unspecified: Secondary | ICD-10-CM | POA: Insufficient documentation

## 2020-06-11 MED ORDER — METFORMIN HCL ER 500 MG PO TB24: ORAL_TABLET | ORAL | 0 refills | Status: DC

## 2020-06-11 MED ORDER — VITAMIN D (ERGOCALCIFEROL) 1.25 MG (50000 UNIT) PO CAPS: 50000.0000 [IU] | ORAL_CAPSULE | ORAL | 0 refills | Status: DC
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
## 2020-06-18 ENCOUNTER — Encounter: Payer: Self-pay | Admitting: Adult Health

## 2020-06-18 DIAGNOSIS — E7800 Pure hypercholesterolemia, unspecified: Secondary | ICD-10-CM

## 2020-06-18 DIAGNOSIS — E785 Hyperlipidemia, unspecified: Secondary | ICD-10-CM

## 2020-06-18 DIAGNOSIS — T466X5A Adverse effect of antihyperlipidemic and antiarteriosclerotic drugs, initial encounter: Secondary | ICD-10-CM

## 2020-06-18 DIAGNOSIS — M791 Myalgia, unspecified site: Secondary | ICD-10-CM

## 2020-06-18 MED ORDER — NEXLIZET 180-10 MG PO TABS: 1.0000 | ORAL_TABLET | ORAL | 1 refills | Status: DC

## 2020-06-19 ENCOUNTER — Telehealth: Payer: Self-pay

## 2020-06-19 ENCOUNTER — Other Ambulatory Visit (INDEPENDENT_AMBULATORY_CARE_PROVIDER_SITE_OTHER): Payer: Self-pay | Admitting: Family Medicine

## 2020-06-19 ENCOUNTER — Other Ambulatory Visit: Payer: Self-pay

## 2020-06-19 DIAGNOSIS — R7303 Prediabetes: Secondary | ICD-10-CM

## 2020-06-19 NOTE — Telephone Encounter (Signed)
**Note De-Identified Wahid Holley Obfuscation** Nexlizet PA started and approved through covermymeds as follows:  Onesty Conklin Key: [id number] Outcome: Approved today Effective from 06/19/2020 through 06/18/2021. Drug: Nexlizet 180-10MG  tablets Form: Librarian, academic Form (CB)  I have notified [facility] of this approval.

## 2020-06-22 NOTE — Telephone Encounter (Signed)
Dr.Opalski ?

## 2020-06-23 ENCOUNTER — Telehealth (INDEPENDENT_AMBULATORY_CARE_PROVIDER_SITE_OTHER): Payer: BC Managed Care – PPO | Admitting: Psychology

## 2020-06-23 ENCOUNTER — Other Ambulatory Visit: Payer: Self-pay

## 2020-06-23 DIAGNOSIS — Z634 Disappearance and death of family member: Secondary | ICD-10-CM

## 2020-06-23 DIAGNOSIS — F5089 Other specified eating disorder: Secondary | ICD-10-CM

## 2020-06-23 NOTE — Progress Notes (Signed)
Chief Complaint:   OBESITY Samantha Andersen is here to discuss her progress with her obesity treatment plan along with follow-up of her obesity related diagnoses.   Today's visit was #: 8 Starting weight: 186 lbs Starting date: 02/11/2020 Today's weight: 179 lbs Today's date: 06/11/2020 Weight change since last visit: +4 lbs Total lbs lost to date: 7 lbs Body mass index is 32.74 kg/m.  Total weight loss percentage to date: -3.76%  Interim History:  Samantha Andersen is following the plan about 50% of the time.  Only eats [place], chicken, seafood, and occasionally lean beef.  Her husband is a Theme park manager and she has a lot of stress in her life.  Current Meal Plan: the Category 1 Plan for 50% of the time.  Current Exercise Plan: House work for 60 minutes 7 times per week.  Assessment/Plan:   Medications Discontinued During This Encounter  Medication Reason   metFORMIN (GLUCOPHAGE-XR) 500 MG 24 hr tablet Reorder   Vitamin D, Ergocalciferol, (DRISDOL) 1.25 MG (50000 UNIT) CAPS capsule Reorder   Meds ordered this encounter  Medications   metFORMIN (GLUCOPHAGE-XR) 500 MG 24 hr tablet    Sig: 2 tab po qd    Dispense:  60 tablet    Refill:  0   Vitamin D, Ergocalciferol, (DRISDOL) 1.25 MG (50000 UNIT) CAPS capsule    Sig: Take 1 capsule (50,000 Units total) by mouth every 7 (seven) days.    Dispense:  4 capsule    Refill:  0    1. Pre-diabetes Not at goal. Goal is HgbA1c < 5.7.  Medication: metformin 1,000 mg twice daily.  Samantha Andersen cannot remember to take her second dose of metformin at night.  Tolerating well.  Plan:  Change to taking metformin 2 tablets in the morning.  She will continue to focus on protein-rich, low simple carbohydrate foods. We reviewed the importance of hydration, regular exercise for stress reduction, and restorative sleep.   Lab Results  Component Value Date   HGBA1C 6.2 (H) 02/11/2020   Lab Results  Component Value Date   INSULIN 11.7 02/11/2020   - Refill metFORMIN  (GLUCOPHAGE-XR) 500 MG 24 hr tablet; 2 tab po qd  Dispense: 60 tablet; Refill: 0  2. Vitamin D deficiency Not at goal. Current vitamin D is 31.4, tested on 02/11/2020. Optimal goal > 50 ng/dL.  She is taking vitamin D 50,000 IU weekly.  Plan: Continue to take prescription Vitamin D @50 ,000 IU every week as prescribed.  Follow-up for routine testing of Vitamin D, at least 2-3 times per year to avoid over-replacement.  - Refill Vitamin D, Ergocalciferol, (DRISDOL) 1.25 MG (50000 UNIT) CAPS capsule; Take 1 capsule (50,000 Units total) by mouth every 7 (seven) days.  Dispense: 4 capsule; Refill: 0  3. Other disorder of eating, with emotional eating Not at goal. Medication: None.  Increased stress over the last 14 months.  Both sets of parents died and sister has cancer.  Denies depression or feeling depressed.  Endorses emotional eating.  Plan:  Declines Wellbutrin/medications at this time.  Patient was referred to Dr. Mallie Andersen, our Bariatric Psychologist, for evaluation due to her elevated PHQ-9 score and significant struggles with emotional eating.  Samantha Andersen will make an appointment.  4. At risk for depression Samantha Andersen was given approximately 8 minutes of depression prevention counseling today due to their higher than average risk for this condition. The patient has several risk factors for depression such as chronic medical conditions, sleep issues, major life stressors/events, etc.,  and we discussed these today.  Samantha Andersen was also counseled on the importance of a healthy work-life balance, a healthy relationship with food, and a good support system.  We discussed various strategies to help cope with these emotions as well.  I recommended counseling, meditation or prayer, healthy eating habits, sleep hygiene, and exercising to help manage these feelings.    5. Obesity, current BMI 32.7  Course: Samantha Andersen is currently in the action stage of change. As such, her goal is to continue with weight  loss efforts.   Nutrition goals: She has agreed to the Category 1 Plan.   Exercise goals:  As is.  Behavioral modification strategies: no skipping meals, meal planning and cooking strategies, keeping healthy foods in the home, and planning for success.  Samantha Andersen has agreed to follow-up with our clinic in 2 weeks. She was informed of the importance of frequent follow-up visits to maximize her success with intensive lifestyle modifications for her multiple health conditions.   Objective:   Blood pressure 105/71, pulse 78, temperature 98.3 F (36.8 C), height 5\' 2"  (1.575 m), weight 179 lb (81.2 kg), SpO2 98 %. Body mass index is 32.74 kg/m.  General: Cooperative, alert, well developed, in no acute distress. HEENT: Conjunctivae and lids unremarkable. Cardiovascular: Regular rhythm.  Lungs: Normal work of breathing. Neurologic: No focal deficits.   Lab Results  Component Value Date   CREATININE 0.76 02/11/2020   BUN 11 02/11/2020   NA 140 02/11/2020   K 4.5 02/11/2020   CL 103 02/11/2020   CO2 24 02/11/2020   Lab Results  Component Value Date   ALT 41 (H) 02/11/2020   AST 34 02/11/2020   ALKPHOS 59 02/11/2020   BILITOT 0.3 02/11/2020   Lab Results  Component Value Date   HGBA1C 6.2 (H) 02/11/2020   Lab Results  Component Value Date   INSULIN 11.7 02/11/2020   Lab Results  Component Value Date   TSH 2.110 02/11/2020   Lab Results  Component Value Date   CHOL 174 02/11/2020   HDL 63 02/11/2020   LDLCALC 94 02/11/2020   TRIG 96 02/11/2020   CHOLHDL 2.2 12/26/2019   Lab Results  Component Value Date   WBC 7.7 02/11/2020   HGB 13.8 02/11/2020   HCT 41.6 02/11/2020   MCV 91 02/11/2020   PLT 334 02/11/2020   Lab Results  Component Value Date   IRON 69 12/15/2016   TIBC 296 12/15/2016   FERRITIN 42 12/15/2016   Attestation Statements:   Reviewed by clinician on day of visit: allergies, medications, problem list, medical history, surgical history, family  history, social history, and previous encounter notes.  I, Water quality scientist, CMA, am acting as Location manager for [employer], DO.  I have reviewed the above documentation for accuracy and completeness, and I agree with the above. Samantha Andersen, D.O.  The [place] was signed into law in 2016 which includes the topic of electronic health records.  This provides immediate access to information in MyChart.  This includes consultation notes, operative notes, office notes, lab results and pathology reports.  If you have any questions about what you read please let us know at your next visit so we can discuss your concerns and take corrective action if need be.  We are right here with you.

## 2020-06-29 ENCOUNTER — Other Ambulatory Visit: Payer: Self-pay

## 2020-06-29 ENCOUNTER — Encounter (INDEPENDENT_AMBULATORY_CARE_PROVIDER_SITE_OTHER): Payer: Self-pay | Admitting: Family Medicine

## 2020-06-29 ENCOUNTER — Ambulatory Visit (INDEPENDENT_AMBULATORY_CARE_PROVIDER_SITE_OTHER): Payer: BC Managed Care – PPO | Admitting: Family Medicine

## 2020-06-29 VITALS — BP 117/81 | HR 85 | Temp 97.9°F | Ht 62.0 in | Wt 175.0 lb

## 2020-06-29 DIAGNOSIS — Z6834 Body mass index (BMI) 34.0-34.9, adult: Secondary | ICD-10-CM | POA: Diagnosis not present

## 2020-06-29 DIAGNOSIS — R7303 Prediabetes: Secondary | ICD-10-CM

## 2020-06-29 DIAGNOSIS — E669 Obesity, unspecified: Secondary | ICD-10-CM | POA: Diagnosis not present

## 2020-06-30 NOTE — Progress Notes (Signed)
Chief Complaint:   OBESITY Samantha Andersen is here to discuss her progress with her obesity treatment plan along with follow-up of her obesity related diagnoses. Samantha Andersen is on the Category 1 Plan and states she is following her eating plan approximately 60% of the time. Samantha Andersen states she is walking 1.5 miles 5 times per week.   Today's visit was #: 9 Starting weight: 186 lbs Starting date: 02/11/2020 Today's weight: 175 lbs Today's date: 06/29/2020 Total lbs lost to date: 11 Total lbs lost since last in-office visit: 4  Interim History: Samantha Andersen is stable on her eating plan. She sometimes struggles to eat all of the food on her plan. 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   Subjective:   1. Pre-diabetes Samantha Andersen is stable on metformin, and she increased her dose to 2 tablets PO q daily and she is doing well with weight loss.  Assessment/Plan:   1. Pre-diabetes Samantha Andersen will continue metformin, diet, exercise, and decreasing simple carbohydrates to help decrease the risk of diabetes.   2. Obesity, current BMI 32.2 Samantha Andersen is currently in the action stage of change. As such, her goal is to continue with weight loss efforts. She has agreed to the Category 1 Plan.   Exercise goals: As is.  Behavioral modification strategies: increasing lean protein intake, decreasing liquid calories, and meal planning and cooking strategies.  Samantha Andersen has agreed to follow-up with our clinic in 2 to 3 weeks. She was informed of the importance of frequent follow-up visits to maximize her success with intensive lifestyle modifications for her multiple health conditions.   Objective:   Blood pressure 117/81, pulse 85, temperature 97.9 F (36.6 C), height 5\' 2"  (1.575 m), weight 175 lb (79.4 kg), SpO2 96 %. Body mass index is 32.01 kg/m.  General: Cooperative, alert, well developed, in no acute distress. HEENT: Conjunctivae and lids unremarkable. Cardiovascular: Regular rhythm.  Lungs: Normal  work of breathing. Neurologic: No focal deficits.   Lab Results  Component Value Date   CREATININE 0.76 02/11/2020   BUN 11 02/11/2020   NA 140 02/11/2020   K 4.5 02/11/2020   CL 103 02/11/2020   CO2 24 02/11/2020   Lab Results  Component Value Date   ALT 41 (H) 02/11/2020   AST 34 02/11/2020   ALKPHOS 59 02/11/2020   BILITOT 0.3 02/11/2020   Lab Results  Component Value Date   HGBA1C 6.2 (H) 02/11/2020   Lab Results  Component Value Date   INSULIN 11.7 02/11/2020   Lab Results  Component Value Date   TSH 2.110 02/11/2020   Lab Results  Component Value Date   CHOL 174 02/11/2020   HDL 63 02/11/2020   LDLCALC 94 02/11/2020   TRIG 96 02/11/2020   CHOLHDL 2.2 12/26/2019   Lab Results  Component Value Date   WBC 7.7 02/11/2020   HGB 13.8 02/11/2020   HCT 41.6 02/11/2020   MCV 91 02/11/2020   PLT 334 02/11/2020   Lab Results  Component Value Date   IRON 69 12/15/2016   TIBC 296 12/15/2016   FERRITIN 42 12/15/2016   Attestation Statements:   Reviewed by clinician on day of visit: allergies, medications, problem list, medical history, surgical history, family history, social history, and previous encounter notes.  Time spent on visit including pre-visit chart review and post-visit care and charting was 30 minutes.    Wilhemena Durie, am acting as transcriptionist for Dennard Nip, MD.  I  have reviewed the above documentation for accuracy and completeness, and I agree with the above. -  Dennard Nip, MD

## 2020-06-30 NOTE — Progress Notes (Signed)
  Office: [phone]  /  Fax: [phone]    Date: July 14, 2020   Appointment Start Time: 8:30am Duration: 25 minutes Provider: Glennie Isle, Psy.D. Type of Session: Individual Therapy  Location of Patient: Park[facility] (safe/private location) Location of Provider: Provider's home (private office) Type of Contact: Telepsychological Visit via MyChart Video Visit  Session Content: Samantha Andersen is a 55 y.o. female presenting for a follow-up appointment to address the previously established treatment goal of increasing coping skills. Today's appointment was a telepsychological visit due to COVID-19. Samantha Andersen provided verbal consent for today's telepsychological appointment and she is aware she is responsible for securing confidentiality on her end of the session. Prior to proceeding with today's appointment, Samantha Andersen's physical location at the time of this appointment was obtained as well a phone number she could be reached at in the event of technical difficulties. Samantha Andersen and this provider participated in today's telepsychological service.   This provider conducted a brief check-in. Samantha Andersen shared about recent events, including setting a boundary to focus on self-care. She endorsed discomfort with being assertive; therefore, associated thoughts and feelings were processed. This provider and Samantha Andersen discussed the impact of stressors on eating habits. She stated when she has a "hard time saying no" can trigger emotional hunger. Psychoeducation regarding triggers for emotional eating was provided. Samantha Andersen was provided a handout, and encouraged to utilize the handout between now and the next appointment to increase awareness of triggers and frequency. Samantha Andersen agreed. This provider also discussed behavioral strategies for specific triggers, such as placing the utensil down when conversing to avoid mindless eating. Samantha Andersen provided verbal consent during today's appointment for this provider to  send a handout about triggers via e-mail. Samantha Andersen was receptive to today's appointment as evidenced by openness to sharing, responsiveness to feedback, and willingness to explore triggers for emotional eating.  Mental Status Examination:  Appearance: well groomed and appropriate hygiene  Behavior: appropriate to circumstances Mood: euthymic Affect: mood congruent Speech: normal in rate, volume, and tone Eye Contact: appropriate Psychomotor Activity: unable to assess Gait: unable to assess Thought Process: linear, logical, and goal directed  Thought Content/Perception: no hallucinations, delusions, bizarre thinking or behavior reported or observed and no evidence or endorsement of suicidal and homicidal ideation, plan, and intent Orientation: time, person, place, and purpose of appointment Memory/Concentration: memory, attention, language, and fund of knowledge intact  Insight/Judgment: good  Interventions:  Conducted a brief chart review Provided empathic reflections and validation Psychoeducation provided regarding triggers for emotional eating Employed supportive psychotherapy interventions to facilitate reduced distress, and to improve coping skills with identified stressors  DSM-5 Diagnosis(es):  F50.89 Other Specified Feeding or Eating Disorder, Emotional Eating Behaviors and X73.5 Uncomplicated Bereavement  Treatment Goal & Progress: During the initial appointment with this provider, the following treatment goal was established: increase coping skills. Progress is limited, as Marleigh has just begun treatment with this provider; however, she is receptive to the interaction and interventions and rapport is being established.   Plan: The next appointment will be scheduled in 2-3 weeks, which will be via MyChart Video Visit. The next session will focus on working towards the established treatment goal.

## 2020-07-02 ENCOUNTER — Other Ambulatory Visit (INDEPENDENT_AMBULATORY_CARE_PROVIDER_SITE_OTHER): Payer: Self-pay | Admitting: Family Medicine

## 2020-07-02 DIAGNOSIS — R7303 Prediabetes: Secondary | ICD-10-CM

## 2020-07-02 NOTE — Telephone Encounter (Signed)
Last OV with Dr. Beasley 

## 2020-07-08 NOTE — Telephone Encounter (Signed)
Patient is requesting a refill of the following medications: Requested Prescriptions   Pending Prescriptions Disp Refills   metFORMIN (GLUCOPHAGE-XR) 500 MG 24 hr tablet [Pharmacy Med Name: METFORMIN HCL ER 500 MG TABLET] 60 tablet 0    Sig: TAKE 1 TABLET BY MOUTH EVERY DAY WITH BREAKFAST    Last office visit: 06/29/20 Date of last refill: 06/11/20 Last refill amount: 60 Follow up time period per chart: 3 week Next appt:07/14/20

## 2020-07-14 ENCOUNTER — Encounter (INDEPENDENT_AMBULATORY_CARE_PROVIDER_SITE_OTHER): Payer: Self-pay | Admitting: Family Medicine

## 2020-07-14 ENCOUNTER — Ambulatory Visit (INDEPENDENT_AMBULATORY_CARE_PROVIDER_SITE_OTHER): Payer: BC Managed Care – PPO | Admitting: Family Medicine

## 2020-07-14 ENCOUNTER — Other Ambulatory Visit: Payer: Self-pay

## 2020-07-14 ENCOUNTER — Telehealth (INDEPENDENT_AMBULATORY_CARE_PROVIDER_SITE_OTHER): Payer: BC Managed Care – PPO | Admitting: Psychology

## 2020-07-14 VITALS — BP 92/65 | HR 94 | Temp 98.2°F | Ht 62.0 in | Wt 176.0 lb

## 2020-07-14 DIAGNOSIS — R252 Cramp and spasm: Secondary | ICD-10-CM | POA: Diagnosis not present

## 2020-07-14 DIAGNOSIS — Z9189 Other specified personal risk factors, not elsewhere classified: Secondary | ICD-10-CM | POA: Diagnosis not present

## 2020-07-14 DIAGNOSIS — Z6834 Body mass index (BMI) 34.0-34.9, adult: Secondary | ICD-10-CM

## 2020-07-14 DIAGNOSIS — E669 Obesity, unspecified: Secondary | ICD-10-CM | POA: Diagnosis not present

## 2020-07-14 DIAGNOSIS — R7303 Prediabetes: Secondary | ICD-10-CM

## 2020-07-14 DIAGNOSIS — F5089 Other specified eating disorder: Secondary | ICD-10-CM

## 2020-07-14 DIAGNOSIS — E559 Vitamin D deficiency, unspecified: Secondary | ICD-10-CM

## 2020-07-14 DIAGNOSIS — Z634 Disappearance and death of family member: Secondary | ICD-10-CM | POA: Diagnosis not present

## 2020-07-14 MED ORDER — METFORMIN HCL ER 500 MG PO TB24: ORAL_TABLET | ORAL | 0 refills | Status: DC

## 2020-07-14 MED ORDER — VITAMIN D (ERGOCALCIFEROL) 1.25 MG (50000 UNIT) PO CAPS: 50000.0000 [IU] | ORAL_CAPSULE | ORAL | 0 refills | Status: DC

## 2020-07-20 NOTE — Progress Notes (Signed)
Chief Complaint:   OBESITY Samantha Andersen is here to discuss her progress with her obesity treatment plan along with follow-up of her obesity related diagnoses. Samantha Andersen is on the Category 1 Plan and states she is following her eating plan approximately 60% of the time. Samantha Andersen states she is walking for 30 minutes 6 times per week.  Today's visit was #: 10 Starting weight: 186 lbs Starting date: 02/11/2020 Today's weight: 176 lbs Today's date: 07/14/2020 Total lbs lost to date: 10 Total lbs lost since last in-office visit: 0  Interim History: Samantha Andersen is retaining some fluid today. She has done well with weight loss overall, but she is open to looking at other eating plan options.  Subjective:   1. Vitamin D deficiency Samantha Andersen is stable on Vit D, but her level is not yet at goal.  2. Pre-diabetes Samantha Andersen is working on diet and weight loss, and she is exercising well. She is tolerating metformin well.  3. Leg cramping Samantha Andersen notes daytime leg cramping and the need to move, similar to restless leg syndrome. She denies anemia, and she is exercising more with increased water intake.  4. At risk for dehydration Samantha Andersen is at risk for dehydration due to inadequate water intake.  Assessment/Plan:   1. Vitamin D deficiency Low Vitamin D level contributes to fatigue and are associated with obesity, breast, and colon cancer. We will refill prescription Vitamin D for 1 month. Samantha Andersen will follow-up for routine testing of Vitamin D, at least 2-3 times per year to avoid over-replacement.  - Vitamin D, Ergocalciferol, (DRISDOL) 1.25 MG (50000 UNIT) CAPS capsule; Take 1 capsule (50,000 Units total) by mouth every 7 (seven) days.  Dispense: 4 capsule; Refill: 0  2. Pre-diabetes Samantha Andersen will continue to work on weight loss, exercise, and decreasing simple carbohydrates to help decrease the risk of diabetes. We will refill metformin for 1 month.  - metFORMIN (GLUCOPHAGE-XR) 500 MG 24 hr tablet; 2 tab po qd  Dispense:  60 tablet; Refill: 0  3. Leg cramping Samantha Andersen is to add zero calorie electrolyte drinks daily for 1 week and see if her symptoms improve.  4. At risk for dehydration Samantha Andersen was given approximately 15 minutes dehydration prevention counseling today. Samantha Andersen is at risk for dehydration due to weight loss and current medication(s). She was encouraged to hydrate and monitor fluid status to avoid dehydration as well as weight loss plateaus.   5. Obesity with current BMI of 32.2 Samantha Andersen is currently in the action stage of change. As such, her goal is to continue with weight loss efforts. She has agreed to change to keeping a food journal and adhering to recommended goals of 1000-1200 calories and 75+ grams of protein daily.   Exercise goals: As is.  Behavioral modification strategies: increasing lean protein intake and keeping a strict food journal.  Samantha Andersen has agreed to follow-up with our clinic in 3 weeks. She was informed of the importance of frequent follow-up visits to maximize her success with intensive lifestyle modifications for her multiple health conditions.   Objective:   Blood pressure 92/65, pulse 94, temperature 98.2 F (36.8 C), height 5\' 2"  (1.575 m), weight 176 lb (79.8 kg), SpO2 97 %. Body mass index is 32.19 kg/m.  General: Cooperative, alert, well developed, in no acute distress. HEENT: Conjunctivae and lids unremarkable. Cardiovascular: Regular rhythm.  Lungs: Normal work of breathing. Neurologic: No focal deficits.   Lab Results  Component Value Date   CREATININE 0.76 02/11/2020   BUN 11  02/11/2020   NA 140 02/11/2020   K 4.5 02/11/2020   CL 103 02/11/2020   CO2 24 02/11/2020   Lab Results  Component Value Date   ALT 41 (H) 02/11/2020   AST 34 02/11/2020   ALKPHOS 59 02/11/2020   BILITOT 0.3 02/11/2020   Lab Results  Component Value Date   HGBA1C 6.2 (H) 02/11/2020   Lab Results  Component Value Date   INSULIN 11.7 02/11/2020   Lab Results  Component  Value Date   TSH 2.110 02/11/2020   Lab Results  Component Value Date   CHOL 174 02/11/2020   HDL 63 02/11/2020   LDLCALC 94 02/11/2020   TRIG 96 02/11/2020   CHOLHDL 2.2 12/26/2019   Lab Results  Component Value Date   VD25OH 31.4 02/11/2020   VD25OH 30.7 12/15/2016   Lab Results  Component Value Date   WBC 7.7 02/11/2020   HGB 13.8 02/11/2020   HCT 41.6 02/11/2020   MCV 91 02/11/2020   PLT 334 02/11/2020   Lab Results  Component Value Date   IRON 69 12/15/2016   TIBC 296 12/15/2016   FERRITIN 42 12/15/2016   Attestation Statements:   Reviewed by clinician on day of visit: allergies, medications, problem list, medical history, surgical history, family history, social history, and previous encounter notes.   I, Trixie Dredge, am acting as transcriptionist for Dennard Nip, MD.  I have reviewed the above documentation for accuracy and completeness, and I agree with the above. -  Dennard Nip, MD

## 2020-07-20 NOTE — Progress Notes (Signed)
  Office: [phone]  /  Fax: [phone]    Date: August 03, 2020   Appointment Start Time: 8:00am Duration: 31 minutes Provider: Glennie Isle, Psy.D. Type of Session: Individual Therapy  Location of Patient: Home Location of Provider: Provider's home (private office) Type of Contact: Telepsychological Visit via MyChart Video Visit  Session Content: Samantha Andersen is a 55 y.o. female presenting for a follow-up appointment to address the previously established treatment goal of increasing coping skills. Today's appointment was a telepsychological visit due to COVID-19. Samantha Andersen provided verbal consent for today's telepsychological appointment and she is aware she is responsible for securing confidentiality on her end of the session. Prior to proceeding with today's appointment, Samantha Andersen's physical location at the time of this appointment was obtained as well a phone number she could be reached at in the event of technical difficulties. Samantha Andersen and this provider participated in today's telepsychological service.   This provider conducted a brief check-in. Samantha Andersen discussed ongoing medical concerns, which she feels impacts her eating habits. More specifically, she described experiencing "[place]" resulting in her indulging at times. This was briefly discussed and processed. To assist with motivation and improve ability to eat congruent to her structured meal plan, psychoeducation provided regarding values. Hypothetical scenarios were utilized to identify Samantha Andersen's values. The following values were identified: accomplishment, empathy, connection with others, organization, helpful, health, stability, and travel. Eating habits were discussed in the context of identified values. This provider and Samantha Andersen also discussed the importance of setting goals congruent to values to help live a fulfilling life. Overall, Samantha Andersen was receptive to today's appointment as evidenced by openness to sharing, responsiveness to feedback, and   willingness to establish short- and long- term goals congruent to her values .  Mental Status Examination:  Appearance: well groomed and appropriate hygiene  Behavior: appropriate to circumstances Mood: euthymic Affect: mood congruent Speech: normal in rate, volume, and tone Eye Contact: appropriate Psychomotor Activity: unable to assess Gait: unable to assess Thought Process: linear, logical, and goal directed  Thought Content/Perception: no hallucinations, delusions, bizarre thinking or behavior reported or observed and no evidence or endorsement of suicidal and homicidal ideation, plan, and intent Orientation: time, person, place, and purpose of appointment Memory/Concentration: memory, attention, language, and fund of knowledge intact  Insight/Judgment: good  Interventions:  Conducted a brief chart review Provided empathic reflections and validation Processed thoughts and feelings Psychoeducation provided regarding values Employed supportive psychotherapy interventions to facilitate reduced distress, and to improve coping skills with identified stressors  DSM-5 Diagnosis(es): F50.89 Other Specified Feeding or Eating Disorder, Emotional Eating Behaviors and X65.5 Uncomplicated Bereavement  Treatment Goal & Progress: During the initial appointment with this provider, the following treatment goal was established: increase coping skills. Samantha Andersen has demonstrated progress in her goal as evidenced by increased awareness of hunger patterns and increased awareness of triggers for emotional eating behaviors. Samantha Andersen also continues to demonstrate willingness to engage in learned skill(s).  Plan: Due to an upcoming trip and husband's birthday, Samantha Andersen requested the next appointment be scheduled in approximately three weeks, which will be via MyChart Video Visit. The next session will focus on working towards the established treatment goal.

## 2020-07-28 ENCOUNTER — Ambulatory Visit (INDEPENDENT_AMBULATORY_CARE_PROVIDER_SITE_OTHER): Payer: BC Managed Care – PPO | Admitting: Family Medicine

## 2020-07-28 ENCOUNTER — Other Ambulatory Visit: Payer: Self-pay

## 2020-07-28 ENCOUNTER — Encounter (INDEPENDENT_AMBULATORY_CARE_PROVIDER_SITE_OTHER): Payer: Self-pay | Admitting: Family Medicine

## 2020-07-28 VITALS — BP 105/73 | HR 64 | Temp 98.6°F | Ht 62.0 in | Wt 178.0 lb

## 2020-07-28 DIAGNOSIS — E669 Obesity, unspecified: Secondary | ICD-10-CM | POA: Diagnosis not present

## 2020-07-28 DIAGNOSIS — Z9189 Other specified personal risk factors, not elsewhere classified: Secondary | ICD-10-CM

## 2020-07-28 DIAGNOSIS — R7303 Prediabetes: Secondary | ICD-10-CM | POA: Diagnosis not present

## 2020-07-28 DIAGNOSIS — Z6834 Body mass index (BMI) 34.0-34.9, adult: Secondary | ICD-10-CM | POA: Diagnosis not present

## 2020-07-28 DIAGNOSIS — E559 Vitamin D deficiency, unspecified: Secondary | ICD-10-CM

## 2020-07-28 MED ORDER — METFORMIN HCL ER 500 MG PO TB24: ORAL_TABLET | ORAL | 0 refills | Status: DC

## 2020-07-28 MED ORDER — VITAMIN D (ERGOCALCIFEROL) 1.25 MG (50000 UNIT) PO CAPS: 50000.0000 [IU] | ORAL_CAPSULE | ORAL | 0 refills | Status: DC

## 2020-07-30 ENCOUNTER — Other Ambulatory Visit (INDEPENDENT_AMBULATORY_CARE_PROVIDER_SITE_OTHER): Payer: Self-pay | Admitting: Family Medicine

## 2020-07-30 DIAGNOSIS — R7303 Prediabetes: Secondary | ICD-10-CM

## 2020-08-03 ENCOUNTER — Ambulatory Visit (INDEPENDENT_AMBULATORY_CARE_PROVIDER_SITE_OTHER): Payer: BC Managed Care – PPO | Admitting: Family Medicine

## 2020-08-03 ENCOUNTER — Telehealth (INDEPENDENT_AMBULATORY_CARE_PROVIDER_SITE_OTHER): Payer: BC Managed Care – PPO | Admitting: Psychology

## 2020-08-03 DIAGNOSIS — Z634 Disappearance and death of family member: Secondary | ICD-10-CM

## 2020-08-03 DIAGNOSIS — F5089 Other specified eating disorder: Secondary | ICD-10-CM | POA: Diagnosis not present

## 2020-08-04 NOTE — Progress Notes (Signed)
Chief Complaint:   OBESITY Samantha Andersen is here to discuss her progress with her obesity treatment plan along with follow-up of her obesity related diagnoses. Samantha Andersen is on keeping a food journal and adhering to recommended goals of 1000-1200 calories and 75+ grams of protein daily and states she is following her eating plan approximately 50% of the time. Samantha Andersen states she is walking for 30 minutes 7 times per week.  Today's visit was #: 11 Starting weight: 186 lbs Starting date: 02/11/2020 Today's weight: 178 lbs Today's date: 07/28/2020 Total lbs lost to date: 8 Total lbs lost since last in-office visit: 0  Interim History: Samantha Andersen is retaining some water weight today. She has had increased temptations and will be traveling soon. She is open to discussing travel strategies.  Subjective:   1. Pre-diabetes Samantha Andersen is working on diet and weight loss, and she is trying to decreasing simple carbohydrates.  She is tolerating metformin well.  2. Vitamin D deficiency Samantha Andersen is stable on Vit D, and she denies nausea, vomiting, or muscle weakness.  3. At risk for impaired metabolic function Samantha Andersen is at increased risk for impaired metabolic function if protein decreases.  Assessment/Plan:   1. Pre-diabetes Samantha Andersen will continue to work on weight loss, exercise, and decreasing simple carbohydrates to help decrease the risk of diabetes. We will refill metformin for 1 month.  - metFORMIN (GLUCOPHAGE-XR) 500 MG 24 hr tablet; 2 tab po qd  Dispense: 60 tablet; Refill: 0  2. Vitamin D deficiency Low Vitamin D level contributes to fatigue and are associated with obesity, breast, and colon cancer. We will refill prescription Vitamin D for 1 month. Samantha Andersen will follow-up for routine testing of Vitamin D, at least 2-3 times per year to avoid over-replacement.  - Vitamin D, Ergocalciferol, (DRISDOL) 1.25 MG (50000 UNIT) CAPS capsule; Take 1 capsule (50,000 Units total) by mouth every 7 (seven) days.  Dispense: 4  capsule; Refill: 0  3. At risk for impaired metabolic function Samantha Andersen was given approximately 15 minutes of impaired  metabolic function prevention counseling today. We discussed intensive lifestyle modifications today with an emphasis on specific nutrition and exercise instructions and strategies.   Repetitive spaced learning was employed today to elicit superior memory formation and behavioral change.  4. Obesity with current BMI 32.7 Samantha Andersen is currently in the action stage of change. As such, her goal is to continue with weight loss efforts. She has agreed to keeping a food journal and adhering to recommended goals of 1000-1200 calories and 75+ grams of protein daily.   Exercise goals: As is.  Behavioral modification strategies: increasing lean protein intake and better snacking choices.  Samantha Andersen has agreed to follow-up with our clinic in 3 to 4 weeks. She was informed of the importance of frequent follow-up visits to maximize her success with intensive lifestyle modifications for her multiple health conditions.   Objective:   Blood pressure 105/73, pulse 64, temperature 98.6 F (37 C), height '5\' 2"'$  (1.575 m), weight 178 lb (80.7 kg), SpO2 97 %. Body mass index is 32.56 kg/m.  General: Cooperative, alert, well developed, in no acute distress. HEENT: Conjunctivae and lids unremarkable. Cardiovascular: Regular rhythm.  Lungs: Normal work of breathing. Neurologic: No focal deficits.   Lab Results  Component Value Date   CREATININE 0.76 02/11/2020   BUN 11 02/11/2020   NA 140 02/11/2020   K 4.5 02/11/2020   CL 103 02/11/2020   CO2 24 02/11/2020   Lab Results  Component Value Date  ALT 41 (H) 02/11/2020   AST 34 02/11/2020   ALKPHOS 59 02/11/2020   BILITOT 0.3 02/11/2020   Lab Results  Component Value Date   HGBA1C 6.2 (H) 02/11/2020   Lab Results  Component Value Date   INSULIN 11.7 02/11/2020   Lab Results  Component Value Date   TSH 2.110 02/11/2020   Lab  Results  Component Value Date   CHOL 174 02/11/2020   HDL 63 02/11/2020   LDLCALC 94 02/11/2020   TRIG 96 02/11/2020   CHOLHDL 2.2 12/26/2019   Lab Results  Component Value Date   VD25OH 31.4 02/11/2020   VD25OH 30.7 12/15/2016   Lab Results  Component Value Date   WBC 7.7 02/11/2020   HGB 13.8 02/11/2020   HCT 41.6 02/11/2020   MCV 91 02/11/2020   PLT 334 02/11/2020   Lab Results  Component Value Date   IRON 69 12/15/2016   TIBC 296 12/15/2016   FERRITIN 42 12/15/2016   Attestation Statements:   Reviewed by clinician on day of visit: allergies, medications, problem list, medical history, surgical history, family history, social history, and previous encounter notes.   I, Trixie Dredge, am acting as transcriptionist for Dennard Nip, MD.  I have reviewed the above documentation for accuracy and completeness, and I agree with the above. -  Dennard Nip, MD

## 2020-08-11 NOTE — Progress Notes (Signed)
  Office: [phone]  /  Fax: [phone]    Date: August 25, 2020   Appointment Start Time: 8:00am Duration: 30 minutes Provider: Glennie Isle, Psy.D. Type of Session: Individual Therapy  Location of Patient: Home Location of Provider: Provider's home (private office) Type of Contact: Telepsychological Visit via MyChart Video Visit  Session Content: Ambrielle is a 55 y.o. female presenting for a follow-up appointment to address the previously established treatment goal of increasing coping skills. Today's appointment was a telepsychological visit due to COVID-19. Reyes provided verbal consent for today's telepsychological appointment and she is aware she is responsible for securing confidentiality on her end of the session. Prior to proceeding with today's appointment, Heavin's physical location at the time of this appointment was obtained as well a phone number she could be reached at in the event of technical difficulties. Garyn and this provider participated in today's telepsychological service.   This provider conducted a brief check-in. Fatemah shared she traveled recently, adding she also had visitors. She further discussed recent frustrations with her eating habits and health. Nevertheless, Babby described focusing on making better choices and engaging in portion control. Reviewed values as discussed during the last appointment. Saharah stated she is trying to focus more on her own well-being, adding she feels starting with the clinic is a "step in the right direction." To further assist with ongoing challenges, psychoeducation provided regarding radical acceptance. This provider and Dailene also discussed using senses to self-soothe and reduce unpleasant emotions. Overall,  Tamyra was receptive to today's appointment as evidenced by openness to sharing, responsiveness to feedback, and willingness to implement discussed strategies .  Mental Status Examination:  Appearance: well groomed and  appropriate hygiene  Behavior: appropriate to circumstances Mood: euthymic Affect: mood congruent Speech: normal in rate, volume, and tone Eye Contact: appropriate Psychomotor Activity: unable to assess Gait: unable to assess Thought Process: linear, logical, and goal directed  Thought Content/Perception: no hallucinations, delusions, bizarre thinking or behavior reported or observed and no evidence or endorsement of suicidal and homicidal ideation, plan, and intent Orientation: time, person, place, and purpose of appointment Memory/Concentration: memory, attention, language, and fund of knowledge intact  Insight/Judgment: good  Interventions:  Conducted a brief chart review Provided empathic reflections and validation Reviewed content from the previous session Processed thoughts and feelings Employed supportive psychotherapy interventions to facilitate reduced distress, and to improve coping skills with identified stressors Psychoeducation provided regarding radical acceptance   DSM-5 Diagnosis(es):  F50.89 Other Specified Feeding or Eating Disorder, Emotional Eating Behaviors and 123XX123 Uncomplicated Bereavement  Treatment Goal & Progress: During the initial appointment with this provider, the following treatment goal was established: increase coping skills. Vitalina has demonstrated progress in her goal as evidenced by increased awareness of hunger patterns and increased awareness of triggers for emotional eating behaviors. Sisilia also reported willingness to engage in radical acceptance.   Plan: The next appointment will be scheduled in two weeks, which will be via MyChart Video Visit. The next session will focus on working towards the established treatment goal.

## 2020-08-20 ENCOUNTER — Other Ambulatory Visit (INDEPENDENT_AMBULATORY_CARE_PROVIDER_SITE_OTHER): Payer: Self-pay | Admitting: Family Medicine

## 2020-08-20 DIAGNOSIS — R7303 Prediabetes: Secondary | ICD-10-CM

## 2020-08-25 ENCOUNTER — Telehealth (INDEPENDENT_AMBULATORY_CARE_PROVIDER_SITE_OTHER): Payer: BC Managed Care – PPO | Admitting: Psychology

## 2020-08-25 DIAGNOSIS — Z634 Disappearance and death of family member: Secondary | ICD-10-CM

## 2020-08-25 DIAGNOSIS — F5089 Other specified eating disorder: Secondary | ICD-10-CM

## 2020-08-25 NOTE — Progress Notes (Signed)
  Office: [phone]  /  Fax: [phone]    Date: September 08, 2020   Appointment Start Time: 11:31am Duration: 28 minutes Provider: Glennie Isle, Psy.D. Type of Session: Individual Therapy  Location of Patient: Parked in car (private/safe location) Location of Provider: Provider's Home (private office) Type of Contact: Telepsychological Visit via MyChart Video Visit  Session Content: Samantha Andersen is a 55 y.o. female presenting for a follow-up appointment to address the previously established treatment goal of increasing coping skills.  Today's appointment was a telepsychological visit due to COVID-19. Samantha Andersen provided verbal consent for today's telepsychological appointment and she is aware she is responsible for securing confidentiality on her end of the session. Prior to proceeding with today's appointment, Samantha Andersen's physical location at the time of this appointment was obtained as well a phone number she could be reached at in the event of technical difficulties. Samantha Andersen and this provider participated in today's telepsychological service.   This provider conducted a brief check-in. Samantha Andersen stated she recently tested positive for COVID-19, noting she is starting to feel better. She provided further updates of recent events. Samantha Andersen discussed having frequent appointments that often involve caloric intake, which makes it challenging for her to stay on track. This provider and Samantha Andersen discussed options for places to reduce temptations/over indulging (e.g., [employer]). This provider and Samantha Andersen also discussed restaurants that have food options that would fit her goals. She was encouraged to make a list for herself for future reference. She agreed. Additionally, she was encouraged to discuss [employer] with Samantha Andersen during their next appointment. She agreed. Moreover, Samantha Andersen discussed engaging in previously shared strategies. Positive reinforcement was provided. Overall, Samantha Andersen was receptive to today's appointment as  evidenced by openness to sharing, responsiveness to feedback, and willingness to implement discussed strategies .  Mental Status Examination:  Appearance: well groomed and appropriate hygiene  Behavior: appropriate to circumstances Mood: euthymic Affect: mood congruent Speech: normal in rate, volume, and tone Eye Contact: appropriate Psychomotor Activity: appropriate Gait: unable to assess Thought Process: linear, logical, and goal directed  Thought Content/Perception: no hallucinations, delusions, bizarre thinking or behavior reported or observed and no evidence or endorsement of suicidal and homicidal ideation, plan, and intent Orientation: time, person, place, and purpose of appointment Memory/Concentration: memory, attention, language, and fund of knowledge intact  Insight/Judgment: good  Interventions:  Conducted a brief chart review Provided empathic reflections and validation Provided positive reinforcement Employed supportive psychotherapy interventions to facilitate reduced distress and to improve coping skills with identified stressors Engaged patient in problem solving  DSM-5 Diagnosis(es): F50.89 Other Specified Feeding or Eating Disorder, Emotional Eating Behaviors and 123XX123 Uncomplicated Bereavement  Treatment Goal & Progress: During the initial appointment with this provider, the following treatment goal was established: increase coping skills. Samantha Andersen has demonstrated progress in her goal as evidenced by increased awareness of hunger patterns and increased awareness of triggers for emotional eating behaviors. Samantha Andersen also continues to demonstrate willingness to engage in learned skill(s).  Plan: The next appointment will be scheduled in three weeks, which will be via MyChart Video Visit. The next session will focus on working towards the established treatment goal.

## 2020-08-27 ENCOUNTER — Telehealth (INDEPENDENT_AMBULATORY_CARE_PROVIDER_SITE_OTHER): Payer: BC Managed Care – PPO | Admitting: Family Medicine

## 2020-08-27 ENCOUNTER — Other Ambulatory Visit: Payer: Self-pay

## 2020-08-27 ENCOUNTER — Encounter (INDEPENDENT_AMBULATORY_CARE_PROVIDER_SITE_OTHER): Payer: Self-pay | Admitting: Family Medicine

## 2020-08-27 DIAGNOSIS — E559 Vitamin D deficiency, unspecified: Secondary | ICD-10-CM | POA: Diagnosis not present

## 2020-08-27 DIAGNOSIS — R7303 Prediabetes: Secondary | ICD-10-CM

## 2020-08-27 DIAGNOSIS — Z6834 Body mass index (BMI) 34.0-34.9, adult: Secondary | ICD-10-CM

## 2020-08-27 DIAGNOSIS — E669 Obesity, unspecified: Secondary | ICD-10-CM

## 2020-08-27 DIAGNOSIS — U071 COVID-19: Secondary | ICD-10-CM | POA: Diagnosis not present

## 2020-08-27 MED ORDER — METFORMIN HCL ER 500 MG PO TB24: ORAL_TABLET | ORAL | 0 refills | Status: DC

## 2020-08-27 MED ORDER — VITAMIN D (ERGOCALCIFEROL) 1.25 MG (50000 UNIT) PO CAPS: 50000.0000 [IU] | ORAL_CAPSULE | ORAL | 0 refills | Status: DC

## 2020-08-31 NOTE — Progress Notes (Signed)
TeleHealth Visit:  Due to the COVID-19 pandemic, this visit was completed with telemedicine (audio/video) technology to reduce patient and provider exposure as well as to preserve personal protective equipment.   Samantha Andersen has verbally consented to this TeleHealth visit. The patient is located at home, the provider is located at the [facility] office. The participants in this visit include the listed provider and patient. The visit was conducted today via MyChart video.   Chief Complaint: OBESITY Samantha Andersen is here to discuss her progress with her obesity treatment plan along with follow-up of her obesity related diagnoses. Samantha Andersen is on keeping a food journal and adhering to recommended goals of 1000-1200 calories and 75+ grams of protein daily and states she is following her eating plan approximately 60% of the time. Samantha Andersen states she was doing a lot of walking prior to getting sick.   Today's visit was #: 12 Starting weight: 186 lbs Starting date: 02/11/2020  Interim History: Samantha Andersen has been sick with COVID for 2 days and she hasn't been able to eat much due to nausea. She is sleeping a lot as well and her cough is just starting. She notes significant fatigue and her visit was change to a virtual visit today.  Subjective:   1. Pre-diabetes Samantha Andersen is stable on metformin, and she is working on diet and exercise. She requests a refill today.  2. Vitamin D deficiency Samantha Andersen is stable on Vit D, and she denies nausea, vomiting, or muscle weakness.  3. COVID Samantha Andersen tested positive for COVID and she is isolating at home. She notes increased fatigue and a cough has recently started. She notes nausea.  Assessment/Plan:   1. Pre-diabetes Chrishanna will continue to work on weight loss, exercise, and decreasing simple carbohydrates to help decrease the risk of diabetes. We will refill metformin for 1 month.  - metFORMIN (GLUCOPHAGE-XR) 500 MG 24 hr tablet; 2 tab po qd  Dispense: 60 tablet;  Refill: 0  2. Vitamin D deficiency Low Vitamin D level contributes to fatigue and are associated with obesity, breast, and colon cancer. We will refill prescription Vitamin D for 1 month. Toshiana will follow-up for routine testing of Vitamin D, at least 2-3 times per year to avoid over-replacement.  - Vitamin D, Ergocalciferol, (DRISDOL) 1.25 MG (50000 UNIT) CAPS capsule; Take 1 capsule (50,000 Units total) by mouth every 7 (seven) days.  Dispense: 4 capsule; Refill: 0  3. COVID Samantha Andersen was educated on her symptoms and the importance of isolation to help prevent the spread to others. She is to contact her primary care physician if her symptoms worsen or fail to improve.  4. Obesity with current BMI 32.56 Samantha Andersen is currently in the action stage of change. As such, her goal is to continue with weight loss efforts. She has agreed to keeping a food journal and adhering to recommended goals of 1000-1200 calories and 75+ grams of protein daily.   Samantha Andersen was encouraged to not worry about weight loss, but instead concentrate on hydration and eating something, and then slowly get back to her meal plan when she feels better.  Behavioral modification strategies: increasing lean protein intake, increasing water intake, and emotional eating strategies.  Samantha Andersen has agreed to follow-up with our clinic in 3 to 4 weeks. She was informed of the importance of frequent follow-up visits to maximize her success with intensive lifestyle modifications for her multiple health conditions.  Objective:   VITALS: Per patient if applicable, see vitals. GENERAL: Alert and in  no acute distress. CARDIOPULMONARY: No increased WOB. Speaking in clear sentences.  PSYCH: Pleasant and cooperative. Speech normal rate and rhythm. Affect is appropriate. Insight and judgement are appropriate. Attention is focused, linear, and appropriate.  NEURO: Oriented as arrived to appointment on time with no prompting.   Lab Results  Component Value  Date   CREATININE 0.76 02/11/2020   BUN 11 02/11/2020   NA 140 02/11/2020   K 4.5 02/11/2020   CL 103 02/11/2020   CO2 24 02/11/2020   Lab Results  Component Value Date   ALT 41 (H) 02/11/2020   AST 34 02/11/2020   ALKPHOS 59 02/11/2020   BILITOT 0.3 02/11/2020   Lab Results  Component Value Date   HGBA1C 6.2 (H) 02/11/2020   Lab Results  Component Value Date   INSULIN 11.7 02/11/2020   Lab Results  Component Value Date   TSH 2.110 02/11/2020   Lab Results  Component Value Date   CHOL 174 02/11/2020   HDL 63 02/11/2020   LDLCALC 94 02/11/2020   TRIG 96 02/11/2020   CHOLHDL 2.2 12/26/2019   Lab Results  Component Value Date   VD25OH 31.4 02/11/2020   VD25OH 30.7 12/15/2016   Lab Results  Component Value Date   WBC 7.7 02/11/2020   HGB 13.8 02/11/2020   HCT 41.6 02/11/2020   MCV 91 02/11/2020   PLT 334 02/11/2020   Lab Results  Component Value Date   IRON 69 12/15/2016   TIBC 296 12/15/2016   FERRITIN 42 12/15/2016    Attestation Statements:   Reviewed by clinician on day of visit: allergies, medications, problem list, medical history, surgical history, family history, social history, and previous encounter notes.   I, Trixie Dredge, am acting as transcriptionist for Dennard Nip, MD.  I have reviewed the above documentation for accuracy and completeness, and I agree with the above. - Dennard Nip, MD

## 2020-09-07 DIAGNOSIS — T466X5A Adverse effect of antihyperlipidemic and antiarteriosclerotic drugs, initial encounter: Secondary | ICD-10-CM

## 2020-09-07 DIAGNOSIS — E785 Hyperlipidemia, unspecified: Secondary | ICD-10-CM

## 2020-09-07 DIAGNOSIS — M791 Myalgia, unspecified site: Secondary | ICD-10-CM

## 2020-09-07 DIAGNOSIS — E7800 Pure hypercholesterolemia, unspecified: Secondary | ICD-10-CM

## 2020-09-08 ENCOUNTER — Telehealth (INDEPENDENT_AMBULATORY_CARE_PROVIDER_SITE_OTHER): Payer: BC Managed Care – PPO | Admitting: Psychology

## 2020-09-08 DIAGNOSIS — Z634 Disappearance and death of family member: Secondary | ICD-10-CM | POA: Diagnosis not present

## 2020-09-08 DIAGNOSIS — D225 Melanocytic nevi of trunk: Secondary | ICD-10-CM | POA: Diagnosis not present

## 2020-09-08 DIAGNOSIS — L905 Scar conditions and fibrosis of skin: Secondary | ICD-10-CM | POA: Diagnosis not present

## 2020-09-08 DIAGNOSIS — L821 Other seborrheic keratosis: Secondary | ICD-10-CM | POA: Diagnosis not present

## 2020-09-08 DIAGNOSIS — F5089 Other specified eating disorder: Secondary | ICD-10-CM

## 2020-09-08 DIAGNOSIS — L910 Hypertrophic scar: Secondary | ICD-10-CM | POA: Diagnosis not present

## 2020-09-08 DIAGNOSIS — L814 Other melanin hyperpigmentation: Secondary | ICD-10-CM | POA: Diagnosis not present

## 2020-09-08 MED ORDER — NEXLIZET 180-10 MG PO TABS
1.0000 | ORAL_TABLET | ORAL | 0 refills | Status: DC
Start: 2020-09-08 — End: 2021-04-06

## 2020-09-08 NOTE — Addendum Note (Signed)
Addended by: Rollen Sox on: 09/08/2020 02:55 PM   Modules accepted: Orders

## 2020-09-15 NOTE — Progress Notes (Signed)
  Office: [phone]  /  Fax: [phone]    Date: September 29, 2020   Appointment Start Time: 2:02pm Duration: 31 minutes Provider: Glennie Isle, Psy.D. Type of Session: Individual Therapy  Location of Patient: Home (private room) Location of Provider: Provider's Home (private office) Type of Contact: Telepsychological Visit via MyChart Video Visit  Session Content: Samantha Andersen is a 55 y.o. female presenting for a follow-up appointment to address the previously established treatment goal of increasing coping skills.Today's appointment was a telepsychological visit due to COVID-19. Samantha Andersen provided verbal consent for today's telepsychological appointment and she is aware she is responsible for securing confidentiality on her end of the session. Prior to proceeding with today's appointment, Samantha Andersen's physical location at the time of this appointment was obtained as well a phone number she could be reached at in the event of technical difficulties. Samantha Andersen and this provider participated in today's telepsychological service.   This provider conducted a brief check-in. Samantha Andersen shared about recent events and upcoming events. Regarding eating, Samantha Andersen discussed challenges with motivation/self-care due to being busy. Psychoeducation provided regarding self-compassion. Samantha Andersen was engaged in a self-compassion exercise to help with eating-related challenges and other ongoing stressors. She was encouraged to regularly ask herself, "What do I need right now?" Moreover, psychoeducation regarding the importance of self-care utilizing the oxygen mask metaphor was provided. Overall, Samantha Andersen was receptive to today's appointment as evidenced by openness to sharing, responsiveness to feedback, and  willingness to work towards increasing self-compassion .  Mental Status Examination:  Appearance: well groomed and appropriate hygiene  Behavior: appropriate to circumstances Mood: euthymic Affect: mood congruent Speech: normal in  rate, volume, and tone Eye Contact: appropriate Psychomotor Activity: appropriate Gait: unable to assess Thought Process: linear, logical, and goal directed  Thought Content/Perception: no hallucinations, delusions, bizarre thinking or behavior reported or observed and no evidence or endorsement of suicidal and homicidal ideation, plan, and intent Orientation: time, person, place, and purpose of appointment Memory/Concentration: memory, attention, language, and fund of knowledge intact  Insight/Judgment: fair  Interventions:  Conducted a brief chart review Provided empathic reflections and validation Employed supportive psychotherapy interventions to facilitate reduced distress and to improve coping skills with identified stressors Psychoeducation provided regarding self-care Psychoeducation provided regarding self-compassion Engaged patient in self-compassion exercise  DSM-5 Diagnosis(es): F50.89 Other Specified Feeding or Eating Disorder, Emotional Eating Behaviors and 123XX123 Uncomplicated Bereavement  Treatment Goal & Progress: During the initial appointment with this provider, the following treatment goal was established: increase coping skills. Samantha Andersen has demonstrated progress in her goal as evidenced by increased awareness of hunger patterns and increased awareness of triggers for emotional eating behaviors. Samantha Andersen also continues to demonstrate willingness to engage in learned skill(s).  Plan: Per Carmencita's request, the next appointment will be scheduled in one month, which will be via Gail Visit. The next session will focus on working towards the established treatment goal.

## 2020-09-17 ENCOUNTER — Other Ambulatory Visit: Payer: Self-pay

## 2020-09-17 ENCOUNTER — Ambulatory Visit (INDEPENDENT_AMBULATORY_CARE_PROVIDER_SITE_OTHER): Payer: BC Managed Care – PPO | Admitting: Family Medicine

## 2020-09-17 ENCOUNTER — Encounter (INDEPENDENT_AMBULATORY_CARE_PROVIDER_SITE_OTHER): Payer: Self-pay | Admitting: Family Medicine

## 2020-09-17 VITALS — BP 105/75 | HR 73 | Temp 98.1°F | Ht 62.0 in | Wt 178.0 lb

## 2020-09-17 DIAGNOSIS — E559 Vitamin D deficiency, unspecified: Secondary | ICD-10-CM | POA: Diagnosis not present

## 2020-09-17 DIAGNOSIS — E669 Obesity, unspecified: Secondary | ICD-10-CM | POA: Diagnosis not present

## 2020-09-17 DIAGNOSIS — Z6834 Body mass index (BMI) 34.0-34.9, adult: Secondary | ICD-10-CM | POA: Diagnosis not present

## 2020-09-17 DIAGNOSIS — Z9189 Other specified personal risk factors, not elsewhere classified: Secondary | ICD-10-CM | POA: Diagnosis not present

## 2020-09-17 DIAGNOSIS — R7303 Prediabetes: Secondary | ICD-10-CM | POA: Diagnosis not present

## 2020-09-17 DIAGNOSIS — E66811 Obesity, class 1: Secondary | ICD-10-CM

## 2020-09-17 MED ORDER — METFORMIN HCL ER 500 MG PO TB24: ORAL_TABLET | ORAL | 0 refills | Status: DC

## 2020-09-17 MED ORDER — VITAMIN D (ERGOCALCIFEROL) 1.25 MG (50000 UNIT) PO CAPS: 50000.0000 [IU] | ORAL_CAPSULE | ORAL | 0 refills | Status: DC

## 2020-09-17 NOTE — Progress Notes (Signed)
Chief Complaint:   OBESITY Samantha Andersen is here to discuss her progress with her obesity treatment plan along with follow-up of her obesity related diagnoses. Samantha Andersen is on keeping a food journal and adhering to recommended goals of 1000-1200 calories and 75+ grams of protein daily and states she is following her eating plan approximately 50% of the time. Samantha Andersen states she is doing 0 minutes 0 times per week.  Today's visit was #: 48 Starting weight: 186 lbs Starting date: 02/11/2020 Today's weight: 178 lbs Today's date: 09/17/2020 Total lbs lost to date: 8 Total lbs lost since last in-office visit: 0  Interim History: Samantha Andersen continues to work on weight loss. She has done well maintaining her weight and has questions about how to journaling using her phone app.  Subjective:   1. Pre-diabetes Samantha Andersen is stable on Vit D, and no side effects were noted.  2. Vitamin D deficiency Samantha Andersen is working on her diet, and she is tolerating metformin well. She denies nausea, vomiting, or hypoglycemia.  3. At risk for diabetes mellitus Samantha Andersen is at higher than average risk for developing diabetes due to obesity.   Assessment/Plan:   1. Pre-diabetes Samantha Andersen will continue to work on weight loss, exercise, and decreasing simple carbohydrates to help decrease the risk of diabetes. We will refill metformin 500 mg 2 tablets daily #60 for 1 month.  2. Vitamin D deficiency Low Vitamin D level contributes to fatigue and are associated with obesity, breast, and colon cancer. We will refill prescription Vitamin D 50,000 IU every week #4 for 1 month. Samantha Andersen will follow-up for routine testing of Vitamin D, at least 2-3 times per year to avoid over-replacement.  3. At risk for diabetes mellitus Samantha Andersen was given approximately 15 minutes of diabetes education and counseling today. We discussed intensive lifestyle modifications today with an emphasis on weight loss as well as increasing exercise and decreasing simple  carbohydrates in her diet. We also reviewed medication options with an emphasis on risk versus benefit of those discussed.   Repetitive spaced learning was employed today to elicit superior memory formation and behavioral change.  4. Obesity with current BMI 32.6 Samantha Andersen is currently in the action stage of change. As such, her goal is to continue with weight loss efforts. She has agreed to keeping a food journal and adhering to recommended goals of 1000-1200 calories and 75+ grams of protein daily.   Samantha Andersen was shown how to journal with [employer] and goals were set in the app for her. Eating Out and Gluten Free handouts were given today.  Exercise goals: All adults should avoid inactivity. Some physical activity is better than none, and adults who participate in any amount of physical activity gain some health benefits.  Behavioral modification strategies: increasing lean protein intake.  Samantha Andersen has agreed to follow-up with our clinic in 2 to 3 weeks. She was informed of the importance of frequent follow-up visits to maximize her success with intensive lifestyle modifications for her multiple health conditions.   Objective:   Blood pressure 105/75, pulse 73, temperature 98.1 F (36.7 C), height '5\' 2"'$  (1.575 m), weight 178 lb (80.7 kg), SpO2 97 %. Body mass index is 32.56 kg/m.  General: Cooperative, alert, well developed, in no acute distress. HEENT: Conjunctivae and lids unremarkable. Cardiovascular: Regular rhythm.  Lungs: Normal work of breathing. Neurologic: No focal deficits.   Lab Results  Component Value Date   CREATININE 0.76 02/11/2020   BUN 11 02/11/2020   NA 140  02/11/2020   K 4.5 02/11/2020   CL 103 02/11/2020   CO2 24 02/11/2020   Lab Results  Component Value Date   ALT 41 (H) 02/11/2020   AST 34 02/11/2020   ALKPHOS 59 02/11/2020   BILITOT 0.3 02/11/2020   Lab Results  Component Value Date   HGBA1C 6.2 (H) 02/11/2020   Lab Results  Component Value Date    INSULIN 11.7 02/11/2020   Lab Results  Component Value Date   TSH 2.110 02/11/2020   Lab Results  Component Value Date   CHOL 174 02/11/2020   HDL 63 02/11/2020   LDLCALC 94 02/11/2020   TRIG 96 02/11/2020   CHOLHDL 2.2 12/26/2019   Lab Results  Component Value Date   VD25OH 31.4 02/11/2020   VD25OH 30.7 12/15/2016   Lab Results  Component Value Date   WBC 7.7 02/11/2020   HGB 13.8 02/11/2020   HCT 41.6 02/11/2020   MCV 91 02/11/2020   PLT 334 02/11/2020   Lab Results  Component Value Date   IRON 69 12/15/2016   TIBC 296 12/15/2016   FERRITIN 42 12/15/2016   Attestation Statements:   Reviewed by clinician on day of visit: allergies, medications, problem list, medical history, surgical history, family history, social history, and previous encounter notes.   I, Trixie Dredge, am acting as transcriptionist for Dennard Nip, MD.  I have reviewed the above documentation for accuracy and completeness, and I agree with the above. -  Dennard Nip, MD

## 2020-09-19 ENCOUNTER — Other Ambulatory Visit (INDEPENDENT_AMBULATORY_CARE_PROVIDER_SITE_OTHER): Payer: Self-pay | Admitting: Family Medicine

## 2020-09-19 DIAGNOSIS — R7303 Prediabetes: Secondary | ICD-10-CM

## 2020-09-29 ENCOUNTER — Telehealth (INDEPENDENT_AMBULATORY_CARE_PROVIDER_SITE_OTHER): Payer: BC Managed Care – PPO | Admitting: Psychology

## 2020-09-29 DIAGNOSIS — Z634 Disappearance and death of family member: Secondary | ICD-10-CM

## 2020-09-29 DIAGNOSIS — F5089 Other specified eating disorder: Secondary | ICD-10-CM | POA: Diagnosis not present

## 2020-10-04 DIAGNOSIS — R300 Dysuria: Secondary | ICD-10-CM | POA: Diagnosis not present

## 2020-10-05 DIAGNOSIS — R339 Retention of urine, unspecified: Secondary | ICD-10-CM | POA: Diagnosis not present

## 2020-10-05 DIAGNOSIS — R809 Proteinuria, unspecified: Secondary | ICD-10-CM | POA: Diagnosis not present

## 2020-10-05 DIAGNOSIS — N3941 Urge incontinence: Secondary | ICD-10-CM | POA: Diagnosis not present

## 2020-10-05 DIAGNOSIS — R350 Frequency of micturition: Secondary | ICD-10-CM | POA: Diagnosis not present

## 2020-10-09 ENCOUNTER — Ambulatory Visit (INDEPENDENT_AMBULATORY_CARE_PROVIDER_SITE_OTHER): Payer: BC Managed Care – PPO | Admitting: Family Medicine

## 2020-10-13 ENCOUNTER — Other Ambulatory Visit: Payer: Self-pay

## 2020-10-13 ENCOUNTER — Ambulatory Visit (INDEPENDENT_AMBULATORY_CARE_PROVIDER_SITE_OTHER): Payer: BC Managed Care – PPO | Admitting: Family Medicine

## 2020-10-13 ENCOUNTER — Encounter (INDEPENDENT_AMBULATORY_CARE_PROVIDER_SITE_OTHER): Payer: Self-pay | Admitting: Family Medicine

## 2020-10-13 VITALS — BP 112/78 | HR 85 | Temp 98.1°F | Ht 62.0 in | Wt 177.0 lb

## 2020-10-13 DIAGNOSIS — Z9189 Other specified personal risk factors, not elsewhere classified: Secondary | ICD-10-CM | POA: Diagnosis not present

## 2020-10-13 DIAGNOSIS — E559 Vitamin D deficiency, unspecified: Secondary | ICD-10-CM

## 2020-10-13 DIAGNOSIS — R7401 Elevation of levels of liver transaminase levels: Secondary | ICD-10-CM | POA: Diagnosis not present

## 2020-10-13 DIAGNOSIS — E669 Obesity, unspecified: Secondary | ICD-10-CM

## 2020-10-13 DIAGNOSIS — Z6834 Body mass index (BMI) 34.0-34.9, adult: Secondary | ICD-10-CM

## 2020-10-13 DIAGNOSIS — R7303 Prediabetes: Secondary | ICD-10-CM

## 2020-10-13 MED ORDER — VITAMIN D (ERGOCALCIFEROL) 1.25 MG (50000 UNIT) PO CAPS: 50000.0000 [IU] | ORAL_CAPSULE | ORAL | 0 refills | Status: DC

## 2020-10-13 MED ORDER — METFORMIN HCL ER 500 MG PO TB24: ORAL_TABLET | ORAL | 0 refills | Status: DC

## 2020-10-13 NOTE — Progress Notes (Signed)
Chief Complaint:   OBESITY Samantha Andersen is here to discuss her progress with her obesity treatment plan along with follow-up of her obesity related diagnoses. Samantha Andersen is on keeping a food journal and adhering to recommended goals of 1000-1200 calories and 75+ grams of protein daily and states she is following her eating plan approximately 50% of the time. Samantha Andersen states she is walking for 30 minutes 3-4 times per week.  Today's visit was #: 14 Starting weight: 186 lbs Starting date: 02/11/2020 Today's weight: 177 lbs Today's date: 10/13/2020 Total lbs lost to date: 9 Total lbs lost since last in-office visit: 1  Interim History: [name] continues to do well with weight loss. She has multiple stressors right now, but she is navigating them well.  Subjective:   1. Pre-diabetes Samantha Andersen is doing well with diet and she is due for labs.  2. Vitamin D deficiency Samantha Andersen is stable on Vit D, and she is due for labs. She denies signs of over-replacement.  3. Elevated alanine aminotransferase (ALT) level Samantha Andersen's last ALT was mildly elevated. She is working on weight loss, and she is due to have labs rechecked.  4. At risk for heart disease Samantha Andersen is at a higher than average risk for cardiovascular disease due to obesity.   Assessment/Plan:   1. Pre-diabetes Samantha Andersen will continue to work on weight loss, exercise, and decreasing simple carbohydrates to help decrease the risk of diabetes. We will check labs today, and we will refill metformin for 1 month.  - metFORMIN (GLUCOPHAGE-XR) 500 MG 24 hr tablet; 2 tab po qd  Dispense: 60 tablet; Refill: 0 - CMP14+EGFR - Lipid Panel With LDL/HDL Ratio - Insulin, random - Hemoglobin A1c - CBC with Differential/Platelet  2. Vitamin D deficiency Low Vitamin D level contributes to fatigue and are associated with obesity, breast, and colon cancer. We will check labs today, and we will refill prescription Vitamin D for 1 month. Samantha Andersen will follow-up for routine testing  of Vitamin D, at least 2-3 times per year to avoid over-replacement.  - Vitamin D, Ergocalciferol, (DRISDOL) 1.25 MG (50000 UNIT) CAPS capsule; Take 1 capsule (50,000 Units total) by mouth every 7 (seven) days.  Dispense: 4 capsule; Refill: 0 - VITAMIN D 25 Hydroxy (Vit-D Deficiency, Fractures)  3. Elevated alanine aminotransferase (ALT) level We discussed the likely diagnosis of non-alcoholic fatty liver disease today and how this condition is obesity related. Samantha Andersen was educated the importance of weight loss. We will check labs today. Samantha Andersen agreed to continue with diet and exercise as an essential part of her treatment plan.  4. At risk for heart disease Samantha Andersen was given approximately 15 minutes of coronary artery disease prevention counseling today. She is 55 y.o. female and has risk factors for heart disease including obesity. We discussed intensive lifestyle modifications today with an emphasis on specific weight loss instructions and strategies.   Repetitive spaced learning was employed today to elicit superior memory formation and behavioral change.  5. Obesity with current BMI 32.4 Samantha Andersen is currently in the action stage of change. As such, her goal is to continue with weight loss efforts. She has agreed to keeping a food journal and adhering to recommended goals of 1000-1200 calories and 75+ grams of protein daily.   Exercise goals: As is.  Behavioral modification strategies: increasing lean protein intake.  Samantha Andersen has agreed to follow-up with our clinic in 4 weeks. She was informed of the importance of frequent follow-up visits to maximize her success with intensive  lifestyle modifications for her multiple health conditions.   Samantha Andersen was informed we would discuss her lab results at her next visit unless there is a critical issue that needs to be addressed sooner. Samantha Andersen agreed to keep her next visit at the agreed upon time to discuss these results.  Objective:   Blood pressure 112/78,  pulse 85, temperature 98.1 F (36.7 C), height 5' 2"  (1.575 m), weight 177 lb (80.3 kg), SpO2 98 %. Body mass index is 32.37 kg/m.  General: Cooperative, alert, well developed, in no acute distress. HEENT: Conjunctivae and lids unremarkable. Cardiovascular: Regular rhythm.  Lungs: Normal work of breathing. Neurologic: No focal deficits.   Lab Results  Component Value Date   CREATININE 0.76 02/11/2020   BUN 11 02/11/2020   NA 140 02/11/2020   K 4.5 02/11/2020   CL 103 02/11/2020   CO2 24 02/11/2020   Lab Results  Component Value Date   ALT 41 (H) 02/11/2020   AST 34 02/11/2020   ALKPHOS 59 02/11/2020   BILITOT 0.3 02/11/2020   Lab Results  Component Value Date   HGBA1C 6.2 (H) 02/11/2020   Lab Results  Component Value Date   INSULIN 11.7 02/11/2020   Lab Results  Component Value Date   TSH 2.110 02/11/2020   Lab Results  Component Value Date   CHOL 174 02/11/2020   HDL 63 02/11/2020   LDLCALC 94 02/11/2020   TRIG 96 02/11/2020   CHOLHDL 2.2 12/26/2019   Lab Results  Component Value Date   VD25OH 31.4 02/11/2020   VD25OH 30.7 12/15/2016   Lab Results  Component Value Date   WBC 7.7 02/11/2020   HGB 13.8 02/11/2020   HCT 41.6 02/11/2020   MCV 91 02/11/2020   PLT 334 02/11/2020   Lab Results  Component Value Date   IRON 69 12/15/2016   TIBC 296 12/15/2016   FERRITIN 42 12/15/2016   Attestation Statements:   Reviewed by clinician on day of visit: allergies, medications, problem list, medical history, surgical history, family history, social history, and previous encounter notes.   I, Trixie Dredge, am acting as transcriptionist for Dennard Nip, MD.  I have reviewed the above documentation for accuracy and completeness, and I agree with the above. -  Dennard Nip, MD

## 2020-10-13 NOTE — Progress Notes (Signed)
  Office: [phone]  /  Fax: [phone]    Date: October 27, 2020   Appointment Start Time: 2:32pm Duration: 23 minutes Provider: Glennie Isle, Psy.D. Type of Session: Individual Therapy  Location of Patient:  Son's home in [place] (private location) Location of Provider: Provider's Home (private office) Type of Contact: Telepsychological Visit via MyChart Video Visit  Session Content: Samantha Andersen is a 55 y.o. female presenting for a follow-up appointment to address the previously established treatment goal of increasing coping skills.Today's appointment was a telepsychological visit due to COVID-19. Samantha Andersen provided verbal consent for today's telepsychological appointment and she is aware she is responsible for securing confidentiality on her end of the session. Prior to proceeding with today's appointment, Samantha Andersen's physical location at the time of this appointment was obtained as well a phone number she could be reached at in the event of technical difficulties. Samantha Andersen and this provider participated in today's telepsychological service.   This provider conducted a brief check-in. Samantha Andersen shared, "My son got married." She shared further about recent events, including pleasant and unpleasant events. Thus, this provider encouraged Samantha Andersen to ask herself what she needs right now to assist with coping and self-compassion. She reported a belief that  having time to "process" would be beneficial. Associated thoughts/feelings processed. This provider assisted Samantha Andersen in developing a plan to help her process and schedule a time to allow for the aforementioned. Additionally, this provider recommended longer-term therapeutic services due to ongoing stressors, and discussed options to establish care with a new provider. Samantha Andersen provided verbal consent for this provider to e-mail a list of referral options. Regarding eating, Samantha Andersen discussed making better choices and engagement in portion control. Positive reinforcement  was provided. Reviewed radical acceptance due to ongoing stressors. She was receptive to focusing on her senses to assist with coping and subsequent radical acceptance. Overall, Samantha Andersen was receptive to today's appointment as evidenced by openness to sharing, responsiveness to feedback, and willingness to engage in learned skills.  Mental Status Examination:  Appearance: well groomed and appropriate hygiene  Behavior: appropriate to circumstances Mood: euthymic Affect: mood congruent Speech: normal in rate, volume, and tone Eye Contact: appropriate Psychomotor Activity: appropriate Gait: unable to assess Thought Process: linear, logical, and goal directed  Thought Content/Perception: no hallucinations, delusions, bizarre thinking or behavior reported or observed and no evidence or endorsement of suicidal and homicidal ideation, plan, and intent Orientation: time, person, place, and purpose of appointment Memory/Concentration: memory, attention, language, and fund of knowledge intact  Insight/Judgment: fair  Interventions:  Conducted a brief chart review Provided empathic reflections and validation Reviewed content from the previous session Provided positive reinforcement Employed supportive psychotherapy interventions to facilitate reduced distress and to improve coping skills with identified stressors Recommended/discussed option for longer-term therapeutic services  DSM-5 Diagnosis(es): F50.89 Other Specified Feeding or Eating Disorder, Emotional Eating Behaviors and I09.7 Uncomplicated Bereavement  Treatment Goal & Progress: During the initial appointment with this provider, the following treatment goal was established: increase coping skills. Samantha Andersen has demonstrated progress in her goal as evidenced by increased awareness of hunger patterns, increased awareness of triggers for emotional eating behaviors, and reduction in emotional eating behaviors . Samantha Andersen also continues to demonstrate  willingness to engage in learned skill(s).  Plan: The next appointment will be scheduled in approximately three weeks, which will be via MyChart Video Visit. The next session will focus on working towards the established treatment goal. Additionally, this provider will e-mail Samantha Andersen referral options.

## 2020-10-14 LAB — CBC WITH DIFFERENTIAL/PLATELET
Basophils Absolute: 0.1 10*3/uL (ref 0.0–0.2)
Basos: 1 %
EOS (ABSOLUTE): 0.4 10*3/uL (ref 0.0–0.4)
Eos: 5 %
Hematocrit: 41.5 % (ref 34.0–46.6)
Hemoglobin: 13.4 g/dL (ref 11.1–15.9)
Immature Grans (Abs): 0 10*3/uL (ref 0.0–0.1)
Immature Granulocytes: 0 %
Lymphocytes Absolute: 2.1 10*3/uL (ref 0.7–3.1)
Lymphs: 31 %
MCH: 29.6 pg (ref 26.6–33.0)
MCHC: 32.3 g/dL (ref 31.5–35.7)
MCV: 92 fL (ref 79–97)
Monocytes Absolute: 0.4 10*3/uL (ref 0.1–0.9)
Monocytes: 6 %
Neutrophils Absolute: 3.9 10*3/uL (ref 1.4–7.0)
Neutrophils: 57 %
Platelets: 375 10*3/uL (ref 150–450)
RBC: 4.53 x10E6/uL (ref 3.77–5.28)
RDW: 12.7 % (ref 11.7–15.4)
WBC: 6.8 10*3/uL (ref 3.4–10.8)

## 2020-10-14 LAB — HEMOGLOBIN A1C
Est. average glucose Bld gHb Est-mCnc: 126 mg/dL
Hgb A1c MFr Bld: 6 % — ABNORMAL HIGH (ref 4.8–5.6)

## 2020-10-14 LAB — LIPID PANEL WITH LDL/HDL RATIO
Cholesterol, Total: 161 mg/dL (ref 100–199)
HDL: 59 mg/dL (ref >39)
LDL Chol Calc (NIH): 83 mg/dL (ref 0–99)
LDL/HDL Ratio: 1.4 ratio (ref 0.0–3.2)
Triglycerides: 105 mg/dL (ref 0–149)
VLDL Cholesterol Cal: 19 mg/dL (ref 5–40)

## 2020-10-14 LAB — CMP14+EGFR
ALT: 24 IU/L (ref 0–32)
AST: 21 IU/L (ref 0–40)
Albumin/Globulin Ratio: 1.8 (ref 1.2–2.2)
Albumin: 4.6 g/dL (ref 3.8–4.9)
Alkaline Phosphatase: 46 IU/L (ref 44–121)
BUN/Creatinine Ratio: 34 — ABNORMAL HIGH (ref 9–23)
BUN: 21 mg/dL (ref 6–24)
Bilirubin Total: 0.3 mg/dL (ref 0.0–1.2)
CO2: 21 mmol/L (ref 20–29)
Calcium: 9.6 mg/dL (ref 8.7–10.2)
Chloride: 104 mmol/L (ref 96–106)
Creatinine, Ser: 0.61 mg/dL (ref 0.57–1.00)
Globulin, Total: 2.5 g/dL (ref 1.5–4.5)
Glucose: 105 mg/dL — ABNORMAL HIGH (ref 70–99)
Potassium: 4.5 mmol/L (ref 3.5–5.2)
Sodium: 142 mmol/L (ref 134–144)
Total Protein: 7.1 g/dL (ref 6.0–8.5)
eGFR: 104 mL/min/{1.73_m2} (ref >59)

## 2020-10-14 LAB — VITAMIN D 25 HYDROXY (VIT D DEFICIENCY, FRACTURES): Vit D, 25-Hydroxy: 53.2 ng/mL (ref 30.0–100.0)

## 2020-10-14 LAB — INSULIN, RANDOM: INSULIN: 13.2 u[IU]/mL (ref 2.6–24.9)

## 2020-10-21 ENCOUNTER — Other Ambulatory Visit (INDEPENDENT_AMBULATORY_CARE_PROVIDER_SITE_OTHER): Payer: Self-pay | Admitting: Family Medicine

## 2020-10-21 DIAGNOSIS — R7303 Prediabetes: Secondary | ICD-10-CM

## 2020-10-26 ENCOUNTER — Encounter: Payer: Self-pay | Admitting: Adult Health

## 2020-10-27 ENCOUNTER — Telehealth (INDEPENDENT_AMBULATORY_CARE_PROVIDER_SITE_OTHER): Payer: BC Managed Care – PPO | Admitting: Psychology

## 2020-10-27 ENCOUNTER — Encounter: Payer: Self-pay | Admitting: Internal Medicine

## 2020-10-27 ENCOUNTER — Other Ambulatory Visit: Payer: Self-pay

## 2020-10-27 ENCOUNTER — Ambulatory Visit (INDEPENDENT_AMBULATORY_CARE_PROVIDER_SITE_OTHER): Payer: BC Managed Care – PPO | Admitting: Internal Medicine

## 2020-10-27 VITALS — BP 118/70 | HR 96 | Resp 18 | Ht 62.0 in | Wt 180.0 lb

## 2020-10-27 DIAGNOSIS — Z148 Genetic carrier of other disease: Secondary | ICD-10-CM

## 2020-10-27 DIAGNOSIS — F5089 Other specified eating disorder: Secondary | ICD-10-CM

## 2020-10-27 DIAGNOSIS — Z634 Disappearance and death of family member: Secondary | ICD-10-CM

## 2020-10-27 DIAGNOSIS — T466X5A Adverse effect of antihyperlipidemic and antiarteriosclerotic drugs, initial encounter: Secondary | ICD-10-CM

## 2020-10-27 DIAGNOSIS — E785 Hyperlipidemia, unspecified: Secondary | ICD-10-CM

## 2020-10-27 DIAGNOSIS — R7303 Prediabetes: Secondary | ICD-10-CM

## 2020-10-27 DIAGNOSIS — R32 Unspecified urinary incontinence: Secondary | ICD-10-CM

## 2020-10-27 DIAGNOSIS — E559 Vitamin D deficiency, unspecified: Secondary | ICD-10-CM

## 2020-10-27 DIAGNOSIS — M791 Myalgia, unspecified site: Secondary | ICD-10-CM | POA: Diagnosis not present

## 2020-10-27 DIAGNOSIS — Z853 Personal history of malignant neoplasm of breast: Secondary | ICD-10-CM

## 2020-10-27 NOTE — Patient Instructions (Addendum)
We will get you in for the pelvic floor training.   We do not need labs today.

## 2020-10-27 NOTE — Progress Notes (Signed)
   Subjective:   Patient ID: Samantha Andersen, female    DOB: Mar 08, 1965, 55 y.o.   MRN: [id number]  HPI The patient is a 55 YO female coming in new for several concerns as well as ongoing care.  PMH, [facility], social history reviewed and updated  Review of Systems  Constitutional: Negative.   HENT: Negative.    Eyes: Negative.   Respiratory:  Negative for cough, chest tightness and shortness of breath.   Cardiovascular:  Negative for chest pain, palpitations and leg swelling.  Gastrointestinal:  Negative for abdominal distention, abdominal pain, constipation, diarrhea, nausea and vomiting.  Genitourinary:  Positive for urgency.  Musculoskeletal: Negative.   Skin: Negative.   Neurological: Negative.   Psychiatric/Behavioral: Negative.     Objective:  Physical Exam Constitutional:      Appearance: She is well-developed.  HENT:     Head: Normocephalic and atraumatic.  Cardiovascular:     Rate and Rhythm: Normal rate and regular rhythm.  Pulmonary:     Effort: Pulmonary effort is normal. No respiratory distress.     Breath sounds: Normal breath sounds. No wheezing or rales.  Abdominal:     General: Bowel sounds are normal. There is no distension.     Palpations: Abdomen is soft.     Tenderness: There is no abdominal tenderness. There is no rebound.  Musculoskeletal:     Cervical back: Normal range of motion.  Skin:    General: Skin is warm and dry.  Neurological:     Mental Status: She is alert and oriented to person, place, and time.     Coordination: Coordination normal.    Vitals:   10/27/20 0856  BP: 118/70  Pulse: 96  Resp: 18  SpO2: 97%  Weight: 180 lb (81.6 kg)  Height: 5\' 2"  (1.575 m)    This visit occurred during the SARS-CoV-2 public health emergency.  Safety protocols were in place, including screening questions prior to the visit, additional usage of staff PPE, and extensive cleaning of exam room while observing appropriate contact time as indicated for  disinfecting solutions.   Assessment & Plan:

## 2020-10-30 DIAGNOSIS — R32 Unspecified urinary incontinence: Secondary | ICD-10-CM | POA: Insufficient documentation

## 2020-10-30 NOTE — Assessment & Plan Note (Signed)
Taking nexlizet and reviewed recent labs with her. Will continue.

## 2020-10-30 NOTE — Assessment & Plan Note (Signed)
Has seen hematology/oncology in the past and should not have issues with this.

## 2020-10-30 NOTE — Assessment & Plan Note (Signed)
Had about 10% risk recurrence in 10 years, is about 9 years out. Did not pursue hormonal blocking therapy after surgery. Had bilateral mastectomy and she had questions about imaging if needed that could be ordered for pain or changes. No changes today.

## 2020-10-30 NOTE — Assessment & Plan Note (Signed)
Reviewed recent vitamin D labs.

## 2020-10-30 NOTE — Assessment & Plan Note (Signed)
Referred to pelvic floor training PT to help.

## 2020-10-30 NOTE — Assessment & Plan Note (Signed)
We did discuss recent labs and strong family history. She is likely to be at higher risk for progression to diabetes than average and she is working on weight and diet. Monitor every 6 months. Taking metformin for prevention of diabetes.

## 2020-10-30 NOTE — Assessment & Plan Note (Signed)
Taking nexlizet and recent lipids reviewed with her.

## 2020-11-02 NOTE — Progress Notes (Unsigned)
  Office: [phone]  /  Fax: [phone]    Date: November 16, 2020   Appointment Start Time: *** Duration: *** minutes Provider: Glennie Isle, Psy.D. Type of Session: Individual Therapy  Location of Patient: {gbptloc:23249} Location of Provider: Provider's Home (private office) Type of Contact: Telepsychological Visit via MyChart Video Visit  Session Content: Samantha Andersen is a 55 y.o. female presenting for a follow-up appointment to address the previously established treatment goal of increasing coping skills.Today's appointment was a telepsychological visit due to COVID-19. [name] provided verbal consent for today's telepsychological appointment and she is aware she is responsible for securing confidentiality on her end of the session. Prior to proceeding with today's appointment, [name]'s physical location at the time of this appointment was obtained as well a phone number she could be reached at in the event of technical difficulties. [name] and this provider participated in today's telepsychological service.   This provider conducted a brief check-in. *** [name] was receptive to today's appointment as evidenced by openness to sharing, responsiveness to feedback, and {gbreceptiveness:23401}.  Mental Status Examination:  Appearance: {Appearance:22431} Behavior: {Behavior:22445} Mood: {gbmood:21757} Affect: {Affect:22436} Speech: {Speech:22432} Eye Contact: {Eye Contact:22433} Psychomotor Activity: {Motor Activity:22434} Gait: {gbgait:23404} Thought Process: {thought process:22448}  Thought Content/Perception: {disturbances:22451} Orientation: {Orientation:22437} Memory/Concentration: {gbcognition:22449} Insight/Judgment: {Insight:22446}  Interventions:  {Interventions for Progress Notes:23405}  DSM-5 Diagnosis(es): F50.89 Other Specified Feeding or Eating Disorder, Emotional Eating Behaviors and I33.8 Uncomplicated Bereavement  Treatment Goal & Progress: During the initial  appointment with this provider, the following treatment goal was established: increase coping skills. [name] has demonstrated progress in her goal as evidenced by {gbtxprogress:22839}. [name] also {gbtxprogress2:22951}.  Plan: The next appointment will be scheduled in {gbweeks:21758}, which will be {gbtxmodality:23402}. The next session will focus on {Plan for Next Appointment:23400}.

## 2020-11-10 ENCOUNTER — Ambulatory Visit (INDEPENDENT_AMBULATORY_CARE_PROVIDER_SITE_OTHER): Payer: BC Managed Care – PPO | Admitting: Family Medicine

## 2020-11-10 ENCOUNTER — Other Ambulatory Visit: Payer: Self-pay

## 2020-11-10 ENCOUNTER — Encounter (INDEPENDENT_AMBULATORY_CARE_PROVIDER_SITE_OTHER): Payer: Self-pay | Admitting: Family Medicine

## 2020-11-10 VITALS — BP 111/76 | HR 91 | Temp 98.0°F | Ht 62.0 in | Wt 181.0 lb

## 2020-11-10 DIAGNOSIS — L739 Follicular disorder, unspecified: Secondary | ICD-10-CM | POA: Diagnosis not present

## 2020-11-10 DIAGNOSIS — E669 Obesity, unspecified: Secondary | ICD-10-CM

## 2020-11-10 DIAGNOSIS — J310 Chronic rhinitis: Secondary | ICD-10-CM | POA: Diagnosis not present

## 2020-11-10 DIAGNOSIS — Z9189 Other specified personal risk factors, not elsewhere classified: Secondary | ICD-10-CM

## 2020-11-10 DIAGNOSIS — Z6834 Body mass index (BMI) 34.0-34.9, adult: Secondary | ICD-10-CM | POA: Diagnosis not present

## 2020-11-10 DIAGNOSIS — R7303 Prediabetes: Secondary | ICD-10-CM

## 2020-11-10 DIAGNOSIS — E559 Vitamin D deficiency, unspecified: Secondary | ICD-10-CM | POA: Diagnosis not present

## 2020-11-10 DIAGNOSIS — J3489 Other specified disorders of nose and nasal sinuses: Secondary | ICD-10-CM | POA: Diagnosis not present

## 2020-11-10 MED ORDER — VITAMIN D (ERGOCALCIFEROL) 1.25 MG (50000 UNIT) PO CAPS: 50000.0000 [IU] | ORAL_CAPSULE | ORAL | 0 refills | Status: DC

## 2020-11-10 NOTE — Progress Notes (Signed)
Chief Complaint:   OBESITY Samantha Andersen is here to discuss her progress with her obesity treatment plan along with follow-up of her obesity related diagnoses. Samantha Andersen is on keeping a food journal and adhering to recommended goals of 1000-1200 calories and 75 grams of protein daily and states she is following her eating plan approximately 50% of the time. Samantha Andersen states she is walking for 30 minutes 4 times per week.  Today's visit was #: 15 Starting weight: 186 lbs Starting date: 02/11/2020 Today's weight: 181 lbs Today's date: 11/10/2020 Total lbs lost to date: 5 Total lbs lost since last in-office visit: 0  Interim History: Samantha Andersen has had extra challenges recently and she is retaining some fluid. She is getting back on track with journaling, and she is open to discussing Thanksgiving eating strategies.  Subjective:   1. Pre-diabetes Samantha Andersen's A1c is improving with diet and weight loss. She is tolerating metformin well. I discussed labs with the patient today.  2. Vitamin D deficiency Samantha Andersen's Vit D level is improving and is now at goal. She is at risk of her level decreasing if she stops taking Vit D, as this has happened in the past. I discussed labs with the patient today.  3. At risk for diabetes mellitus Samantha Andersen is at higher than average risk for developing diabetes due to obesity.   Assessment/Plan:   1. Pre-diabetes Samantha Andersen will continue metformin, diet, and decreasing simple carbohydrates to help decrease the risk of diabetes.   2. Vitamin D deficiency Low Vitamin D level contributes to fatigue and are associated with obesity, breast, and colon cancer. We will refill prescription Vitamin D 50,000 IU every week #4 for 1 month. Samantha Andersen will follow-up for routine testing of Vitamin D, at least 2-3 times per year to avoid over-replacement.  3. At risk for diabetes mellitus Samantha Andersen was given approximately 15 minutes of diabetes education and counseling today. We discussed intensive lifestyle  modifications today with an emphasis on weight loss as well as increasing exercise and decreasing simple carbohydrates in her diet. We also reviewed medication options with an emphasis on risk versus benefit of those discussed.   Repetitive spaced learning was employed today to elicit superior memory formation and behavioral change.  4. Obesity BMI 29 Samantha Andersen is currently in the action stage of change. As such, her goal is to continue with weight loss efforts. She has agreed to keeping a food journal and adhering to recommended goals of 1000-1200 calories and 70+ grams of protein daily.   Exercise goals: As is.  Behavioral modification strategies: holiday eating strategies .  Samantha Andersen has agreed to follow-up with our clinic in 4 weeks. She was informed of the importance of frequent follow-up visits to maximize her success with intensive lifestyle modifications for her multiple health conditions.   Objective:   Blood pressure 111/76, pulse 91, temperature 98 F (36.7 C), height 5\' 2"  (1.575 m), weight 181 lb (82.1 kg), SpO2 96 %. Body mass index is 33.11 kg/m.  General: Cooperative, alert, well developed, in no acute distress. HEENT: Conjunctivae and lids unremarkable. Cardiovascular: Regular rhythm.  Lungs: Normal work of breathing. Neurologic: No focal deficits.   Lab Results  Component Value Date   CREATININE 0.61 10/13/2020   BUN 21 10/13/2020   NA 142 10/13/2020   K 4.5 10/13/2020   CL 104 10/13/2020   CO2 21 10/13/2020   Lab Results  Component Value Date   ALT 24 10/13/2020   AST 21 10/13/2020   ALKPHOS  46 10/13/2020   BILITOT 0.3 10/13/2020   Lab Results  Component Value Date   HGBA1C 6.0 (H) 10/13/2020   HGBA1C 6.2 (H) 02/11/2020   Lab Results  Component Value Date   INSULIN 13.2 10/13/2020   INSULIN 11.7 02/11/2020   Lab Results  Component Value Date   TSH 2.110 02/11/2020   Lab Results  Component Value Date   CHOL 161 10/13/2020   HDL 59 10/13/2020    LDLCALC 83 10/13/2020   TRIG 105 10/13/2020   CHOLHDL 2.2 12/26/2019   Lab Results  Component Value Date   VD25OH 53.2 10/13/2020   VD25OH 31.4 02/11/2020   VD25OH 30.7 12/15/2016   Lab Results  Component Value Date   WBC 6.8 10/13/2020   HGB 13.4 10/13/2020   HCT 41.5 10/13/2020   MCV 92 10/13/2020   PLT 375 10/13/2020   Lab Results  Component Value Date   IRON 69 12/15/2016   TIBC 296 12/15/2016   FERRITIN 42 12/15/2016    Attestation Statements:   Reviewed by clinician on day of visit: allergies, medications, problem list, medical history, surgical history, family history, social history, and previous encounter notes.   I, Trixie Dredge, am acting as transcriptionist for Dennard Nip, MD.  I have reviewed the above documentation for accuracy and completeness, and I agree with the above. -  Dennard Nip, MD

## 2020-11-16 ENCOUNTER — Telehealth (INDEPENDENT_AMBULATORY_CARE_PROVIDER_SITE_OTHER): Payer: BC Managed Care – PPO | Admitting: Psychology

## 2020-11-21 ENCOUNTER — Other Ambulatory Visit (INDEPENDENT_AMBULATORY_CARE_PROVIDER_SITE_OTHER): Payer: Self-pay | Admitting: Family Medicine

## 2020-11-21 DIAGNOSIS — R7303 Prediabetes: Secondary | ICD-10-CM

## 2020-11-23 NOTE — Telephone Encounter (Signed)
LAST APPOINTMENT DATE: 11/10/20 NEXT APPOINTMENT DATE: 12/14/20   [id number] - [place], [place] - [address] [place] [address] Phone: [phone] Fax: [phone]  Patient is requesting a refill of the following medications: Requested Prescriptions   Pending Prescriptions Disp Refills   metFORMIN (GLUCOPHAGE-XR) 500 MG 24 hr tablet [Pharmacy Med Name: METFORMIN HCL ER 500 MG TABLET] 60 tablet 0    Sig: TAKE 2 TABLETS BY MOUTH EVERY DAY    Date last filled: 10/13/20 Previously prescribed by Dr. Leafy Ro  Lab Results  Component Value Date   HGBA1C 6.0 (H) 10/13/2020   HGBA1C 6.2 (H) 02/11/2020   Lab Results  Component Value Date   LDLCALC 83 10/13/2020   CREATININE 0.61 10/13/2020   Lab Results  Component Value Date   VD25OH 53.2 10/13/2020   VD25OH 31.4 02/11/2020   VD25OH 30.7 12/15/2016    BP Readings from Last 3 Encounters:  11/10/20 111/76  10/27/20 118/70  10/13/20 112/78

## 2020-12-07 DIAGNOSIS — R7301 Impaired fasting glucose: Secondary | ICD-10-CM | POA: Diagnosis not present

## 2020-12-07 DIAGNOSIS — E538 Deficiency of other specified B group vitamins: Secondary | ICD-10-CM | POA: Diagnosis not present

## 2020-12-07 DIAGNOSIS — L659 Nonscarring hair loss, unspecified: Secondary | ICD-10-CM | POA: Diagnosis not present

## 2020-12-07 DIAGNOSIS — E559 Vitamin D deficiency, unspecified: Secondary | ICD-10-CM | POA: Diagnosis not present

## 2020-12-14 ENCOUNTER — Ambulatory Visit (INDEPENDENT_AMBULATORY_CARE_PROVIDER_SITE_OTHER): Payer: BC Managed Care – PPO | Admitting: Family Medicine

## 2020-12-15 DIAGNOSIS — G4730 Sleep apnea, unspecified: Secondary | ICD-10-CM | POA: Diagnosis not present

## 2020-12-15 DIAGNOSIS — K900 Celiac disease: Secondary | ICD-10-CM | POA: Diagnosis not present

## 2020-12-15 DIAGNOSIS — R490 Dysphonia: Secondary | ICD-10-CM | POA: Diagnosis not present

## 2020-12-15 DIAGNOSIS — K219 Gastro-esophageal reflux disease without esophagitis: Secondary | ICD-10-CM | POA: Diagnosis not present

## 2020-12-15 DIAGNOSIS — E559 Vitamin D deficiency, unspecified: Secondary | ICD-10-CM | POA: Diagnosis not present

## 2020-12-15 DIAGNOSIS — E538 Deficiency of other specified B group vitamins: Secondary | ICD-10-CM | POA: Diagnosis not present

## 2020-12-15 DIAGNOSIS — R7301 Impaired fasting glucose: Secondary | ICD-10-CM | POA: Diagnosis not present

## 2020-12-21 DIAGNOSIS — Z01419 Encounter for gynecological examination (general) (routine) without abnormal findings: Secondary | ICD-10-CM | POA: Diagnosis not present

## 2020-12-21 DIAGNOSIS — Z6834 Body mass index (BMI) 34.0-34.9, adult: Secondary | ICD-10-CM | POA: Diagnosis not present

## 2020-12-22 DIAGNOSIS — M8580 Other specified disorders of bone density and structure, unspecified site: Secondary | ICD-10-CM | POA: Diagnosis not present

## 2020-12-22 DIAGNOSIS — M47899 Other spondylosis, site unspecified: Secondary | ICD-10-CM | POA: Diagnosis not present

## 2020-12-22 DIAGNOSIS — R768 Other specified abnormal immunological findings in serum: Secondary | ICD-10-CM | POA: Diagnosis not present

## 2020-12-23 DIAGNOSIS — R3915 Urgency of urination: Secondary | ICD-10-CM | POA: Diagnosis not present

## 2020-12-23 DIAGNOSIS — R351 Nocturia: Secondary | ICD-10-CM | POA: Diagnosis not present

## 2020-12-23 DIAGNOSIS — N393 Stress incontinence (female) (male): Secondary | ICD-10-CM | POA: Diagnosis not present

## 2020-12-23 DIAGNOSIS — R350 Frequency of micturition: Secondary | ICD-10-CM | POA: Diagnosis not present

## 2021-01-14 ENCOUNTER — Other Ambulatory Visit: Payer: Self-pay

## 2021-01-14 ENCOUNTER — Ambulatory Visit (INDEPENDENT_AMBULATORY_CARE_PROVIDER_SITE_OTHER): Payer: BC Managed Care – PPO | Admitting: Family Medicine

## 2021-01-14 ENCOUNTER — Encounter (INDEPENDENT_AMBULATORY_CARE_PROVIDER_SITE_OTHER): Payer: Self-pay | Admitting: Family Medicine

## 2021-01-14 VITALS — BP 116/79 | HR 86 | Temp 98.1°F | Ht 62.0 in | Wt 184.0 lb

## 2021-01-14 DIAGNOSIS — E559 Vitamin D deficiency, unspecified: Secondary | ICD-10-CM | POA: Diagnosis not present

## 2021-01-14 DIAGNOSIS — R7303 Prediabetes: Secondary | ICD-10-CM | POA: Diagnosis not present

## 2021-01-14 DIAGNOSIS — Z9189 Other specified personal risk factors, not elsewhere classified: Secondary | ICD-10-CM | POA: Diagnosis not present

## 2021-01-14 DIAGNOSIS — Z6833 Body mass index (BMI) 33.0-33.9, adult: Secondary | ICD-10-CM | POA: Diagnosis not present

## 2021-01-14 DIAGNOSIS — Z6834 Body mass index (BMI) 34.0-34.9, adult: Secondary | ICD-10-CM

## 2021-01-14 DIAGNOSIS — E669 Obesity, unspecified: Secondary | ICD-10-CM | POA: Diagnosis not present

## 2021-01-14 MED ORDER — VITAMIN D (ERGOCALCIFEROL) 1.25 MG (50000 UNIT) PO CAPS: 50000.0000 [IU] | ORAL_CAPSULE | ORAL | 0 refills | Status: DC

## 2021-01-18 NOTE — Progress Notes (Signed)
Chief Complaint:   OBESITY Rocky is here to discuss her progress with her obesity treatment plan along with follow-up of her obesity related diagnoses. Yaqueline is on keeping a food journal and adhering to recommended goals of 1000-1200 calories and 70+ grams of protein daily and states she is following her eating plan approximately 10% of the time. Nakeeta states she is doing 0 minutes 0 times per week.  Today's visit was #: 16 Starting weight: 186 lbs Starting date: 02/11/2020 Today's weight: 184 lbs Today's date: 01/14/2021 Total lbs lost to date: 2 Total lbs lost since last in-office visit: 0  Interim History: Trey did well with minimizing holiday weight gain. She is ready to get back on track with her weight loss efforts, and she is open to looking at other eating plans.   Subjective:   1. Pre-diabetes Lovelyn is doing well on metformin. Her simple carbohydrates had increased over Christmas, but she is getting back on track.  2. Vitamin D deficiency Seniyah is stable on Vitamin D with no side effects noted.   3. At risk for heart disease Taiz is at a higher than average risk for cardiovascular disease due to obesity.   Assessment/Plan:   1. Pre-diabetes Vickie will continue metformin, diet,and decreasing simple carbohydrates to help decrease the risk of diabetes. She will continue to follow up as directed.   2. Vitamin D deficiency We will refill prescription Vitamin D for 1 month. Destyn will follow-up for routine testing of Vitamin D, at least 2-3 times per year to avoid over-replacement.  - Vitamin D, Ergocalciferol, (DRISDOL) 1.25 MG (50000 UNIT) CAPS capsule; Take 1 capsule (50,000 Units total) by mouth every 7 (seven) days.  Dispense: 4 capsule; Refill: 0  3. At risk for heart disease Romelle was given approximately 15 minutes of coronary artery disease prevention counseling today. She is 56 y.o. female and has risk factors for heart disease including obesity. We discussed  intensive lifestyle modifications today with an emphasis on specific weight loss instructions and strategies.   Repetitive spaced learning was employed today to elicit superior memory formation and behavioral change.  4. Obesity wit current  BMI of 33.7 Iley is currently in the action stage of change. As such, her goal is to get back to weightloss efforts . She has agreed to change to following a lower carbohydrate, vegetable and lean protein rich diet plan.   Behavioral modification strategies: increasing lean protein intake and meal planning and cooking strategies.  Mozelle has agreed to follow-up with our clinic in 3 to 4 weeks. She was informed of the importance of frequent follow-up visits to maximize her success with intensive lifestyle modifications for her multiple health conditions.   Objective:   Blood pressure 116/79, pulse 86, temperature 98.1 F (36.7 C), temperature source Oral, height 5\' 2"  (1.575 m), weight 184 lb (83.5 kg), SpO2 98 %. Body mass index is 33.65 kg/m.  General: Cooperative, alert, well developed, in no acute distress. HEENT: Conjunctivae and lids unremarkable. Cardiovascular: Regular rhythm.  Lungs: Normal work of breathing. Neurologic: No focal deficits.   Lab Results  Component Value Date   CREATININE 0.61 10/13/2020   BUN 21 10/13/2020   NA 142 10/13/2020   K 4.5 10/13/2020   CL 104 10/13/2020   CO2 21 10/13/2020   Lab Results  Component Value Date   ALT 24 10/13/2020   AST 21 10/13/2020   ALKPHOS 46 10/13/2020   BILITOT 0.3 10/13/2020   Lab Results  Component Value Date   HGBA1C 6.0 (H) 10/13/2020   HGBA1C 6.2 (H) 02/11/2020   Lab Results  Component Value Date   INSULIN 13.2 10/13/2020   INSULIN 11.7 02/11/2020   Lab Results  Component Value Date   TSH 2.110 02/11/2020   Lab Results  Component Value Date   CHOL 161 10/13/2020   HDL 59 10/13/2020   LDLCALC 83 10/13/2020   TRIG 105 10/13/2020   CHOLHDL 2.2 12/26/2019   Lab  Results  Component Value Date   VD25OH 53.2 10/13/2020   VD25OH 31.4 02/11/2020   VD25OH 30.7 12/15/2016   Lab Results  Component Value Date   WBC 6.8 10/13/2020   HGB 13.4 10/13/2020   HCT 41.5 10/13/2020   MCV 92 10/13/2020   PLT 375 10/13/2020   Lab Results  Component Value Date   IRON 69 12/15/2016   TIBC 296 12/15/2016   FERRITIN 42 12/15/2016   Attestation Statements:   Reviewed by clinician on day of visit: allergies, medications, problem list, medical history, surgical history, family history, social history, and previous encounter notes.   I, Trixie Dredge, am acting as transcriptionist for Dennard Nip, MD.  I have reviewed the above documentation for accuracy and completeness, and I agree with the above. -  Dennard Nip, MD

## 2021-01-26 ENCOUNTER — Encounter: Payer: Self-pay | Admitting: Internal Medicine

## 2021-02-08 DIAGNOSIS — J301 Allergic rhinitis due to pollen: Secondary | ICD-10-CM | POA: Diagnosis not present

## 2021-02-10 ENCOUNTER — Encounter (HOSPITAL_BASED_OUTPATIENT_CLINIC_OR_DEPARTMENT_OTHER): Payer: Self-pay

## 2021-02-10 DIAGNOSIS — G4733 Obstructive sleep apnea (adult) (pediatric): Secondary | ICD-10-CM

## 2021-02-11 ENCOUNTER — Other Ambulatory Visit (HOSPITAL_BASED_OUTPATIENT_CLINIC_OR_DEPARTMENT_OTHER): Payer: Self-pay

## 2021-02-11 DIAGNOSIS — G4739 Other sleep apnea: Secondary | ICD-10-CM

## 2021-02-15 ENCOUNTER — Ambulatory Visit (INDEPENDENT_AMBULATORY_CARE_PROVIDER_SITE_OTHER): Payer: BC Managed Care – PPO | Admitting: Family Medicine

## 2021-03-08 DIAGNOSIS — E7800 Pure hypercholesterolemia, unspecified: Secondary | ICD-10-CM | POA: Diagnosis not present

## 2021-03-08 DIAGNOSIS — E538 Deficiency of other specified B group vitamins: Secondary | ICD-10-CM | POA: Diagnosis not present

## 2021-03-08 DIAGNOSIS — E559 Vitamin D deficiency, unspecified: Secondary | ICD-10-CM | POA: Diagnosis not present

## 2021-03-08 DIAGNOSIS — R7301 Impaired fasting glucose: Secondary | ICD-10-CM | POA: Diagnosis not present

## 2021-03-11 ENCOUNTER — Encounter (INDEPENDENT_AMBULATORY_CARE_PROVIDER_SITE_OTHER): Payer: Self-pay | Admitting: Family Medicine

## 2021-03-11 ENCOUNTER — Other Ambulatory Visit: Payer: Self-pay

## 2021-03-11 ENCOUNTER — Ambulatory Visit (INDEPENDENT_AMBULATORY_CARE_PROVIDER_SITE_OTHER): Payer: BC Managed Care – PPO | Admitting: Family Medicine

## 2021-03-11 VITALS — BP 101/65 | HR 65 | Temp 97.5°F | Ht 62.0 in | Wt 173.0 lb

## 2021-03-11 DIAGNOSIS — E559 Vitamin D deficiency, unspecified: Secondary | ICD-10-CM

## 2021-03-11 DIAGNOSIS — E669 Obesity, unspecified: Secondary | ICD-10-CM

## 2021-03-11 DIAGNOSIS — Z6831 Body mass index (BMI) 31.0-31.9, adult: Secondary | ICD-10-CM | POA: Diagnosis not present

## 2021-03-11 DIAGNOSIS — Z6834 Body mass index (BMI) 34.0-34.9, adult: Secondary | ICD-10-CM

## 2021-03-11 DIAGNOSIS — R7303 Prediabetes: Secondary | ICD-10-CM | POA: Diagnosis not present

## 2021-03-11 DIAGNOSIS — Z9189 Other specified personal risk factors, not elsewhere classified: Secondary | ICD-10-CM

## 2021-03-11 MED ORDER — METFORMIN HCL ER 500 MG PO TB24: ORAL_TABLET | ORAL | 0 refills | Status: DC

## 2021-03-11 MED ORDER — VITAMIN D (ERGOCALCIFEROL) 1.25 MG (50000 UNIT) PO CAPS: 50000.0000 [IU] | ORAL_CAPSULE | ORAL | 0 refills | Status: DC

## 2021-03-15 DIAGNOSIS — E7800 Pure hypercholesterolemia, unspecified: Secondary | ICD-10-CM | POA: Diagnosis not present

## 2021-03-15 DIAGNOSIS — R7301 Impaired fasting glucose: Secondary | ICD-10-CM | POA: Diagnosis not present

## 2021-03-15 DIAGNOSIS — E559 Vitamin D deficiency, unspecified: Secondary | ICD-10-CM | POA: Diagnosis not present

## 2021-03-15 DIAGNOSIS — E538 Deficiency of other specified B group vitamins: Secondary | ICD-10-CM | POA: Diagnosis not present

## 2021-03-15 NOTE — Progress Notes (Signed)
? ? ? ?Chief Complaint:  ? ?OBESITY ?Samantha Andersen is here to discuss her progress with her obesity treatment plan along with follow-up of her obesity related diagnoses. Samantha Andersen is on keeping a food journal and adhering to recommended goals of 1000-1200 calories and 70+ grams of protein daily and states she is following her eating plan approximately 90% of the time. Samantha Andersen states she is doing 0 minutes 0 times per week. ? ?Today's visit was #: 39 ?Starting weight: 186 lbs ?Starting date: 02/11/2020 ?Today's weight: 173 lbs ?Today's date: 03/11/2021 ?Total lbs lost to date: 37 ?Total lbs lost since last in-office visit: 9 ? ?Interim History: Izetta continues to do well with weight loss. She is spreading out her meals every 2-3 hours. She is meeting her protein goals regularly. She is hoping to get back to walking. ? ?Subjective:  ? ?1. Vitamin D deficiency ?Saman is on Vit D, and last labs were at goal. She is due to have labs rechecked again soon. ? ?2. Pre-diabetes ?Samantha Andersen is working on diet and exercise. She is stable on metformin, and her last A1c was 6.0. ? ?3. At risk for heart disease ?Samantha Andersen is at higher than average risk for cardiovascular disease due to obesity. ? ?Assessment/Plan:  ? ?1. Vitamin D deficiency ?Low Vitamin D level contributes to fatigue and are associated with obesity, breast, and colon cancer. We will refill prescription Vitamin D for 1 month. Samantha Andersen will follow-up for routine testing of Vitamin D, at least 2-3 times per year to avoid over-replacement. ? ?- Vitamin D, Ergocalciferol, (DRISDOL) 1.25 MG (50000 UNIT) CAPS capsule; Take 1 capsule (50,000 Units total) by mouth every 7 (seven) days.  Dispense: 4 capsule; Refill: 0 ? ?2. Pre-diabetes ?[name] will continue to work on weight loss, exercise, and decreasing simple carbohydrates to help decrease the risk of diabetes. We will refill metformin for 1 month. ? ?- metFORMIN (GLUCOPHAGE-XR) 500 MG 24 hr tablet; TAKE 2 TABLETS BY MOUTH EVERY DAY  Dispense: 60  tablet; Refill: 0 ? ?3. At risk for heart disease ?Samantha Andersen was given approximately 15 minutes of coronary artery disease prevention counseling today. She is 56 y.o. female and has risk factors for heart disease including obesity. We discussed intensive lifestyle modifications today with an emphasis on specific weight loss instructions and strategies. ? ?Repetitive spaced learning was employed today to elicit superior memory formation and behavioral change.  ? ?4. Obesity with current  BMI of 31.8 ?Samantha Andersen is currently in the action stage of change. As such, her goal is to continue with weight loss efforts. She has agreed to keeping a food journal and adhering to recommended goals of 1000-1200 calories and 75+ grams of protein daily.  ? ?We will recheck fasting labs at her next visit. ? ?Exercise goals: All adults should avoid inactivity. Some physical activity is better than none, and adults who participate in any amount of physical activity gain some health benefits. ? ?Behavioral modification strategies: increasing lean protein intake, no skipping meals, and meal planning and cooking strategies. ? ?Samantha Andersen has agreed to follow-up with our clinic in 4 weeks. She was informed of the importance of frequent follow-up visits to maximize her success with intensive lifestyle modifications for her multiple health conditions.  ? ?Objective:  ? ?Blood pressure 101/65, pulse 65, temperature (!) 97.5 ?F (36.4 ?C), height '5\' 2"'$  (1.575 m), weight 173 lb (78.5 kg), SpO2 99 %. ?Body mass index is 31.64 kg/m?. ? ?General: Cooperative, alert, well developed, in no acute distress. ?HEENT:  Conjunctivae and lids unremarkable. ?Cardiovascular: Regular rhythm.  ?Lungs: Normal work of breathing. ?Neurologic: No focal deficits.  ? ?Lab Results  ?Component Value Date  ? CREATININE 0.61 10/13/2020  ? BUN 21 10/13/2020  ? NA 142 10/13/2020  ? K 4.5 10/13/2020  ? CL 104 10/13/2020  ? CO2 21 10/13/2020  ? ?Lab Results  ?Component Value Date  ? ALT  24 10/13/2020  ? AST 21 10/13/2020  ? ALKPHOS 46 10/13/2020  ? BILITOT 0.3 10/13/2020  ? ?Lab Results  ?Component Value Date  ? HGBA1C 6.0 (H) 10/13/2020  ? HGBA1C 6.2 (H) 02/11/2020  ? ?Lab Results  ?Component Value Date  ? INSULIN 13.2 10/13/2020  ? INSULIN 11.7 02/11/2020  ? ?Lab Results  ?Component Value Date  ? TSH 2.110 02/11/2020  ? ?Lab Results  ?Component Value Date  ? CHOL 161 10/13/2020  ? HDL 59 10/13/2020  ? [place] 83 10/13/2020  ? TRIG 105 10/13/2020  ? CHOLHDL 2.2 12/26/2019  ? ?Lab Results  ?Component Value Date  ? VD25OH 53.2 10/13/2020  ? VD25OH 31.4 02/11/2020  ? VD25OH 30.7 12/15/2016  ? ?Lab Results  ?Component Value Date  ? WBC 6.8 10/13/2020  ? HGB 13.4 10/13/2020  ? HCT 41.5 10/13/2020  ? MCV 92 10/13/2020  ? PLT 375 10/13/2020  ? ?Lab Results  ?Component Value Date  ? IRON 69 12/15/2016  ? TIBC 296 12/15/2016  ? FERRITIN 42 12/15/2016  ? ?Attestation Statements:  ? ?Reviewed by clinician on day of visit: allergies, medications, problem list, medical history, surgical history, family history, social history, and previous encounter notes. ? ? ?I, Trixie Dredge, am acting as transcriptionist for Dennard Nip, MD. ? ?I have reviewed the above documentation for accuracy and completeness, and I agree with the above. -  Dennard Nip, MD ? ? ?

## 2021-03-25 ENCOUNTER — Encounter: Payer: Self-pay | Admitting: Internal Medicine

## 2021-04-06 ENCOUNTER — Encounter: Payer: Self-pay | Admitting: Internal Medicine

## 2021-04-06 ENCOUNTER — Ambulatory Visit: Payer: BC Managed Care – PPO | Admitting: Internal Medicine

## 2021-04-06 ENCOUNTER — Other Ambulatory Visit: Payer: Self-pay

## 2021-04-06 VITALS — BP 112/70 | HR 90 | Resp 18 | Ht 62.0 in | Wt 173.6 lb

## 2021-04-06 DIAGNOSIS — Z7184 Encounter for health counseling related to travel: Secondary | ICD-10-CM

## 2021-04-06 DIAGNOSIS — Z853 Personal history of malignant neoplasm of breast: Secondary | ICD-10-CM

## 2021-04-06 MED ORDER — CIPROFLOXACIN HCL 500 MG PO TABS: 500.0000 mg | ORAL_TABLET | Freq: Two times a day (BID) | ORAL | 0 refills | Status: DC

## 2021-04-06 NOTE — Progress Notes (Signed)
? ? ?Office Visit  ?  ?Patient Name: Samantha Andersen ?Date of Encounter: 04/07/2021 ? ?Primary Care Provider:  Hoyt Koch, MD ?Primary Cardiologist:  Freada Bergeron, MD ?Primary Electrophysiologist: None ?Chief Complaint  ?  ?1 year follow-up for sinus tachycardia ? ? Patient Profile: ?HLD ?Breast CA ?Anemia ?Ankylosing spondylitis ?Sinus tachycardia ?Statin intolerant ? ? Recent Studies: ?03/2013 dobutamine stress echo: Normal response, normal wall motion ?09/2013 Holter monitor: Inappropriate sinus tach ?Coronary CTA: Calcium score 0, mild CAD,(0-25% prox RCA, 0-25% prox LAD) ?Exercise tolerance test: Normal ECG and appropriate heart rate response ? ?History of Present Illness  ?  ?Samantha Andersen is a 56 y.o. female who presents today for ?1 year follow-up.  Patient has a PMH of breast CA, PCOS, anemia, anxiety, HLD, celiac disease, ankylosing spondylitis, inappropriate sinus tach.  She was last seen by Dr. Johney Frame on 6/22, during that visit patient had complained of dizziness.  She did not tolerate beta-blockers in the past for her sinus tachycardia.  She is also statin intolerant and is currently taking nexlizet and red yeast rice. ? ?Since last being seen in our clinic the patient reports doing well but still has occasional bouts of dizziness and chest pain. Pain is described as a sharp pinch that is very brief in the  left upper region. Pain is not associated with any shortness of breath and occurs with and without exertion. Her dizziness occurs any time with or without activity. She is staying hydrated and eating appropriate meals. She is exercising and staying active but has bouts of shortness of breath with climbing stairs and walking. She denies PND, orthopnea, nausea, vomiting, dizziness, syncope, edema, weight gain, or early satiety. She developed increased dizziness with nexlizet and discontinued use in November after meeting with  Pharm D. Her most recent LDL in 03/2021 was 148 with  total cholesterol of 213. ? ?Past Medical History  ?  ?Past Medical History:  ?Diagnosis Date  ? Anemia   ? Anxiety   ? B12 deficiency   ? Back pain   ? Breast cancer ([place])   ? left  ? Cancer [facility])   ? Celiac disease   ? Complication of anesthesia   ? tachycardic, also reports that she needed increased anesth. because she has a high threshold for medicine/sedation  ? Esophagitis   ? GERD (gastroesophageal reflux disease)   ? Goiter   ? 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 Hemochromatosis carrier   ? History of gestational diabetes   ? Hot flashes   ? Hyperlipidemia   ? Inappropriate sinus tachycardia   ? Infertility associated with anovulation   ? Joint pain   ? Mild CAD   ? a. mild nonobstructive CAD (0-25% prox RCA, 0-25% prox LAD).   ? Other calcification of muscle, unspecified ankle and foot   ? Other fatigue   ? PCOS (polycystic ovarian syndrome)   ? PCOS (polycystic ovarian syndrome)   ? PONV (postoperative nausea and vomiting)   ? also reports N&V accompanies anxiety  ? Shortness of breath on exertion   ? Skin cancer   ? Spondyloarthritis   ? Swallowing difficulty   ? Vitamin D deficiency   ? ?Past Surgical History:  ?Procedure Laterality Date  ? ABDOMINAL HYSTERECTOMY    ? CESAREAN SECTION    ? x2  ? MASTECTOMY Bilateral   ? SIMPLE MASTECTOMY WITH AXILLARY SENTINEL NODE BIOPSY  11/24/2011  ? Procedure: SIMPLE MASTECTOMY WITH AXILLARY SENTINEL NODE BIOPSY;  Surgeon:  Haywood Lasso, MD;  Location: [place];  Service: General;  Laterality: Left;  Bilateral Total Mastectomy and Left Sentinel Node  ? SIMPLE MASTECTOMY WITH AXILLARY SENTINEL NODE BIOPSY  11/24/2011  ? Procedure: SIMPLE MASTECTOMY;  Surgeon: Haywood Lasso, MD;  Location: [place];  Service: General;  Laterality: Right;  ? TISSUE EXPANDER PLACEMENT  11/24/2011  ? Procedure: TISSUE EXPANDER;  Surgeon: Crissie Reese, MD;  Location: [place];  Service: Plastics;  Laterality: Bilateral;  ? ? ?Allergies ? ?Allergies  ?Allergen Reactions  ? Doxycycline  Shortness Of Breath  ? Gluten Meal Shortness Of Breath  ? Other Shortness Of Breath and Other (See Comments)  ?  SQUASH  ? Tetracyclines & Related Shortness Of Breath and Other (See Comments)  ?  Lethargy ?  ? Metformin And Related   ?  Patient prefers to take Tevo brand metformin XR due to sensitivity rection to other brands. Patient is allergic to gluten and must have gluten free Metformin  ? Statins Rash  ? ? ?Home Medications  ?  ?Current Outpatient Medications  ?Medication Sig Dispense Refill  ? acetaminophen (TYLENOL) 325 MG tablet Take 650 mg by mouth as needed.    ? dexlansoprazole (DEXILANT) 60 MG capsule Take 60 mg by mouth daily.    ? metFORMIN (GLUCOPHAGE-XR) 500 MG 24 hr tablet TAKE 2 TABLETS BY MOUTH EVERY DAY 60 tablet 0  ? metoprolol tartrate (LOPRESSOR) 100 MG tablet TAKE 1 TABLET BY MOUTH 2 HOURS PRIOR TO THE CARDIAC CT 1 tablet 0  ? Vitamin D, Ergocalciferol, (DRISDOL) 1.25 MG (50000 UNIT) CAPS capsule Take 1 capsule (50,000 Units total) by mouth every 7 (seven) days. 4 capsule 0  ? ?No current facility-administered medications for this visit.  ?  ? ?Review of Systems  ?Please see the history of present illness.    ?(+)Left upper sternal Chest Pain ? ?All other systems reviewed and are otherwise negative except as noted above. ? ?Physical Exam  ?  ?Wt Readings from Last 3 Encounters:  ?04/07/21 173 lb 6.4 oz (78.7 kg)  ?04/06/21 173 lb 9.6 oz (78.7 kg)  ?03/11/21 173 lb (78.5 kg)  ? ?VS: ?Vitals:  ? 04/07/21 0911  ?BP: 100/62  ?Pulse: 77  ?SpO2: 97%  ?,Body mass index is 31.72 kg/m?. ? ?Constitutional:   ?   Appearance: Healthy appearance. Not in distress.  ?Neck:  ?   Vascular: JVD normal.  ?Pulmonary:  ?   Effort: Pulmonary effort is normal.  ?   Breath sounds: No wheezing. No rales.  ?Cardiovascular:  ?   Normal rate. Regular rhythm. Normal S1. Normal S2.   ?   Murmurs: There is no murmur.  ?Edema: ?   Peripheral edema absent.  ?Abdominal:  ?   Palpations: Abdomen is soft. There is no  hepatomegaly.  ?Skin: ?   General: Skin is warm and dry.  ?Neurological:  ?   General: No focal deficit present.  ?   Mental Status: Alert and oriented to person, place and time.  ?   Cranial Nerves: Cranial nerves are intact.  ?EKG/LABS/Other Studies Reviewed  ?  ?ECG personally reviewed by me today -sinus rhythm with rate of 77- no acute changes. ? ?Risk Assessment/Calculations:   ? ?Lab Results  ?Component Value Date  ? WBC 6.8 10/13/2020  ? HGB 13.4 10/13/2020  ? HCT 41.5 10/13/2020  ? MCV 92 10/13/2020  ? PLT 375 10/13/2020  ? ?Lab Results  ?Component Value Date  ? CREATININE 0.61  10/13/2020  ? BUN 21 10/13/2020  ? NA 142 10/13/2020  ? K 4.5 10/13/2020  ? CL 104 10/13/2020  ? CO2 21 10/13/2020  ? ?Lab Results  ?Component Value Date  ? ALT 24 10/13/2020  ? AST 21 10/13/2020  ? ALKPHOS 46 10/13/2020  ? BILITOT 0.3 10/13/2020  ? ?Lab Results  ?Component Value Date  ? CHOL 161 10/13/2020  ? HDL 59 10/13/2020  ? [place] 83 10/13/2020  ? TRIG 105 10/13/2020  ? CHOLHDL 2.2 12/26/2019  ?  ?Lab Results  ?Component Value Date  ? HGBA1C 6.0 (H) 10/13/2020  ? ? ?Assessment & Plan  ?  ?1.  Inappropriate sinus tachycardia: ?-Patient's heart rate today is 77 however she reports that she is experiencing continued bouts of palpitations.  We discussed wearing possible ZIO monitor but patient declined to have this done at this time. ?-She was advised to continue exercise and continue hydration daily. ? ?2.  Atypical chest pain: ?-2017 cardiac CTA revealed minimal CAD. ?-Patient has continued complaints of chest pain and dizziness   ?-We will order a Cardiac CTA and 2D echo to evaluate shortness of breath and atypical chest pain symptoms. Further work up may be advised if findings are abnormal. ?-BMET today ? ?  Hyperlipidemia/statin intolerant: ?-She is intolerant to all statins and most recent LDL is 148.  Patient was last seen in lipid clinic 11/22.  During visit nexlizet was discontinued due to increased dizziness.  Patient  was advised to schedule appointment with lipid clinic to discuss further options regarding medication. ?  ?Disposition: Follow-up with Freada Bergeron, MD or APP in 3 months ?   ?Medication Adjustments/Labs and

## 2021-04-06 NOTE — Progress Notes (Signed)
? ?  Subjective:  ? ?Patient ID: Samantha Andersen, female    DOB: 14-Sep-1965, 56 y.o.   MRN: [id number] ? ?HPI ?The patient is a 56 YO female coming in for several concerns.  ? ?Review of Systems  ?Constitutional: Negative.   ?HENT:  Positive for tinnitus.   ?Eyes: Negative.   ?Respiratory:  Negative for cough, chest tightness and shortness of breath.   ?Cardiovascular:  Negative for chest pain, palpitations and leg swelling.  ?Gastrointestinal:  Negative for abdominal distention, abdominal pain, constipation, diarrhea, nausea and vomiting.  ?Musculoskeletal: Negative.   ?Skin:  Positive for color change.  ?Neurological: Negative.   ?Psychiatric/Behavioral: Negative.    ? ?Objective:  ?Physical Exam ?Constitutional:   ?   Appearance: She is well-developed.  ?HENT:  ?   Head: Normocephalic and atraumatic.  ?   Right Ear: Tympanic membrane and ear canal normal.  ?   Left Ear: Tympanic membrane and ear canal normal.  ?Cardiovascular:  ?   Rate and Rhythm: Normal rate and regular rhythm.  ?   Comments: Left chest wall some darkening of the skin superior to mastectomy scar with some itching to the skin, no mass detected on exam ?Pulmonary:  ?   Effort: Pulmonary effort is normal. No respiratory distress.  ?   Breath sounds: Normal breath sounds. No wheezing or rales.  ?Abdominal:  ?   General: Bowel sounds are normal. There is no distension.  ?   Palpations: Abdomen is soft.  ?   Tenderness: There is no abdominal tenderness. There is no rebound.  ?Musculoskeletal:  ?   Cervical back: Normal range of motion.  ?Skin: ?   General: Skin is warm and dry.  ?Neurological:  ?   Mental Status: She is alert and oriented to person, place, and time.  ?   Coordination: Coordination normal.  ? ? ?Vitals:  ? 04/06/21 0810  ?BP: 112/70  ?Pulse: 90  ?Resp: 18  ?SpO2: 99%  ?Weight: 173 lb 9.6 oz (78.7 kg)  ?Height: '5\' 2"'$  (1.575 m)  ? ? ?This visit occurred during the SARS-CoV-2 public health emergency.  Safety protocols were in place,  including screening questions prior to the visit, additional usage of staff PPE, and extensive cleaning of exam room while observing appropriate contact time as indicated for disinfecting solutions.  ? ?Assessment & Plan:  ? ?

## 2021-04-06 NOTE — Patient Instructions (Addendum)
Try either zyrtec or claritin for the ear to see if this helps the ringing. ? ?We will get the ultrasound of the chest wall.  ? ? ? ?

## 2021-04-07 ENCOUNTER — Encounter: Payer: Self-pay | Admitting: Nurse Practitioner

## 2021-04-07 ENCOUNTER — Ambulatory Visit: Payer: BC Managed Care – PPO | Admitting: Nurse Practitioner

## 2021-04-07 VITALS — BP 100/62 | HR 77 | Ht 62.0 in | Wt 173.4 lb

## 2021-04-07 DIAGNOSIS — E785 Hyperlipidemia, unspecified: Secondary | ICD-10-CM | POA: Diagnosis not present

## 2021-04-07 DIAGNOSIS — R000 Tachycardia, unspecified: Secondary | ICD-10-CM | POA: Diagnosis not present

## 2021-04-07 DIAGNOSIS — R072 Precordial pain: Secondary | ICD-10-CM

## 2021-04-07 LAB — BASIC METABOLIC PANEL
BUN/Creatinine Ratio: 30 — ABNORMAL HIGH (ref 9–23)
BUN: 21 mg/dL (ref 6–24)
CO2: 24 mmol/L (ref 20–29)
Calcium: 10.4 mg/dL — ABNORMAL HIGH (ref 8.7–10.2)
Chloride: 102 mmol/L (ref 96–106)
Creatinine, Ser: 0.69 mg/dL (ref 0.57–1.00)
Glucose: 95 mg/dL (ref 70–99)
Potassium: 4.7 mmol/L (ref 3.5–5.2)
Sodium: 137 mmol/L (ref 134–144)
eGFR: 101 mL/min/{1.73_m2} (ref >59)

## 2021-04-07 MED ORDER — METOPROLOL TARTRATE 100 MG PO TABS: ORAL_TABLET | ORAL | 0 refills | Status: DC

## 2021-04-07 NOTE — Patient Instructions (Addendum)
Medication Instructions:  ?Your physician recommends that you continue on your current medications as directed. Please refer to the Current Medication list given to you today. ? ?*If you need a refill on your cardiac medications before your next appointment, please call your pharmacy* ? ? ?Lab Work: ?TODAY:  BMET ? ?If you have labs (blood work) drawn today and your tests are completely normal, you will receive your results only by: ?MyChart Message (if you have MyChart) OR ?A paper copy in the mail ?If you have any lab test that is abnormal or we need to change your treatment, we will call you to review the results. ? ? ?Testing/Procedures: ?Your physician has requested that you have an echocardiogram. Echocardiography is a painless test that uses sound waves to create images of your heart. It provides your doctor with information about the size and shape of your heart and how well your heart?s chambers and valves are working. This procedure takes approximately one hour. There are no restrictions for this procedure. ? ?Your physician has requested that you have cardiac CT. Cardiac computed tomography (CT) is a painless test that uses an x-ray machine to take clear, detailed pictures of your heart. For further information please visit [email or web address]. Please follow instruction sheet BELOW: ? ? ? ? ?Your cardiac CT will be scheduled at one of the below locations:  ? ?[facility] ?[address] ?[place], [place] [address] ?[phone] ? ?Please arrive at the [facility] and Children's Entrance (Entrance C2) of [facility] 30 minutes prior to test start time. ?You can use the FREE valet parking offered at entrance C (encouraged to control the heart rate for the test)  ?Proceed to the [facility] Radiology Department (first floor) to check-in and test prep. ? ?All radiology patients and guests should use entrance C2 at [facility], accessed from [facility], even though the  hospital's physical address listed is [address] ? ? ? ?IPlease follow these instructions carefully (unless otherwise directed): ? ?On the Night Before the Test: ?Be sure to Drink plenty of water. ?Do not consume any caffeinated/decaffeinated beverages or chocolate 12 hours prior to your test. ?Do not take any antihistamines 12 hours prior to your test. ? ?On the Day of the Test: ?Drink plenty of water until 1 hour prior to the test. ?Do not eat any food 4 hours prior to the test. ?You may take your regular medications prior to the test.  ?Take metoprolol (Lopressor) 100 mg two hours prior to test. (This has been sen to [facility]) ?FEMALES- please wear underwire-free bra if available, avoid dresses & tight clothing ?     ?After the Test: ?Drink plenty of water. ?After receiving IV contrast, you may experience a mild flushed feeling. This is normal. ?On occasion, you may experience a mild rash up to 24 hours after the test. This is not dangerous. If this occurs, you can take Benadryl 25 mg and increase your fluid intake. ?If you experience trouble breathing, this can be serious. If it is severe call 911 IMMEDIATELY. If it is mild, please call our office. ? ? ?We will call to schedule your test 2-4 weeks out understanding that some insurance companies will need an authorization prior to the service being performed.  ? ?For non-scheduling related questions, please contact the cardiac imaging nurse navigator should you have any questions/concerns: ?Marchia Bond, Cardiac Imaging Nurse Navigator ?Gordy Clement, Cardiac Imaging Nurse Navigator ? [facility] ?Direct  Office Dial: [phone]  ? ?For scheduling needs, including cancellations and rescheduling, please call [place], [phone]. ? ? ? ?Follow-Up: ?At [facility], you and your health needs are our priority.  As part of our continuing mission to provide you with exceptional heart care, we have created designated Provider  Care Teams.  These Care Teams include your primary Cardiologist (physician) and Advanced Practice Providers (APPs -  Physician Assistants and Nurse Practitioners) who all work together to provide you with the care you need, when you need it. ? ?We recommend signing up for the patient portal called "MyChart".  Sign up information is provided on this After Visit Summary.  MyChart is used to connect with patients for Virtual Visits (Telemedicine).  Patients are able to view lab/test results, encounter notes, upcoming appointments, etc.  Non-urgent messages can be sent to your provider as well.   ?To learn more about what you can do with MyChart, go to [email or web address].   ? ?Your next appointment:   ?3 month(s) ? ?The format for your next appointment:   ?In Person ? ?Provider:   ?Freada Bergeron, MD  or APP ? ? ?Other Instructions ?  ?

## 2021-04-08 ENCOUNTER — Telehealth (HOSPITAL_COMMUNITY): Payer: Self-pay | Admitting: *Deleted

## 2021-04-08 ENCOUNTER — Encounter (HOSPITAL_COMMUNITY): Payer: Self-pay

## 2021-04-08 DIAGNOSIS — K900 Celiac disease: Secondary | ICD-10-CM | POA: Diagnosis not present

## 2021-04-08 DIAGNOSIS — Z800 Family history of malignant neoplasm of digestive organs: Secondary | ICD-10-CM | POA: Diagnosis not present

## 2021-04-08 DIAGNOSIS — K440 Diaphragmatic hernia with obstruction, without gangrene: Secondary | ICD-10-CM | POA: Diagnosis not present

## 2021-04-08 DIAGNOSIS — K219 Gastro-esophageal reflux disease without esophagitis: Secondary | ICD-10-CM | POA: Diagnosis not present

## 2021-04-08 NOTE — Telephone Encounter (Signed)
Reaching out to patient to offer assistance regarding upcoming cardiac imaging study; pt verbalizes understanding of appt date/time, parking situation and where to check in, pre-test NPO status and medications ordered, and verified current allergies; name and call back number provided for further questions should they arise ? ?Gordy Clement RN Navigator Cardiac Imaging ?[facility] ?[phone] office ?[phone] cell ? ?Patient to take '100mg'$  metoprolol tartrate two hours prior to her cardiac CT. Verified metoprolol dose with provider. Patient states her HR is in the 90s and had this test successfully 6 years ago. She is aware to arrive at 3:30pm for her 4pm test. ?

## 2021-04-09 ENCOUNTER — Encounter: Payer: Self-pay | Admitting: Internal Medicine

## 2021-04-09 ENCOUNTER — Ambulatory Visit (HOSPITAL_COMMUNITY)
Admission: RE | Admit: 2021-04-09 | Discharge: 2021-04-09 | Disposition: A | Discharge status: Home / Self Care | Payer: BC Managed Care – PPO | Source: Ambulatory Visit | Attending: Nurse Practitioner | Admitting: Nurse Practitioner

## 2021-04-09 DIAGNOSIS — R072 Precordial pain: Secondary | ICD-10-CM | POA: Insufficient documentation

## 2021-04-09 DIAGNOSIS — Z7184 Encounter for health counseling related to travel: Secondary | ICD-10-CM | POA: Insufficient documentation

## 2021-04-09 MED ORDER — NITROGLYCERIN 0.4 MG SL SUBL
0.8000 mg | SUBLINGUAL_TABLET | Freq: Once | SUBLINGUAL | Status: AC
  Administered 2021-04-09: 0.8 mg via SUBLINGUAL

## 2021-04-09 MED ORDER — SODIUM CHLORIDE 0.9 % IV SOLN: Freq: Once | INTRAVENOUS | Status: AC

## 2021-04-09 MED ORDER — NITROGLYCERIN 0.4 MG SL SUBL
SUBLINGUAL_TABLET | SUBLINGUAL | Status: DC
Start: 2021-04-09 — End: 2021-04-10
  Filled 2021-04-09: qty 2

## 2021-04-09 MED ORDER — IOHEXOL 350 MG/ML SOLN
95.0000 mL | Freq: Once | INTRAVENOUS | Status: AC | PRN
  Administered 2021-04-09: 95 mL via INTRAVENOUS

## 2021-04-09 NOTE — Assessment & Plan Note (Signed)
There is some itching and thickening of the skin above left breast. S/P bilateral mastectomy so mammogram is not an option. Ordered US left breast inc axillary.  ?

## 2021-04-09 NOTE — Assessment & Plan Note (Signed)
Rx cipro to take for traveler's diarrhea as she does travel out of country and to locations without available medical care. She will fill once needed.  ?

## 2021-04-12 ENCOUNTER — Other Ambulatory Visit: Payer: Self-pay | Admitting: *Deleted

## 2021-04-12 MED ORDER — ASPIRIN EC 81 MG PO TBEC: 81.0000 mg | DELAYED_RELEASE_TABLET | Freq: Every day | ORAL | 3 refills | Status: DC

## 2021-04-16 ENCOUNTER — Ambulatory Visit (HOSPITAL_COMMUNITY): Payer: BC Managed Care – PPO | Attending: Cardiology

## 2021-04-16 DIAGNOSIS — R072 Precordial pain: Secondary | ICD-10-CM | POA: Insufficient documentation

## 2021-04-16 DIAGNOSIS — E785 Hyperlipidemia, unspecified: Secondary | ICD-10-CM | POA: Insufficient documentation

## 2021-04-16 DIAGNOSIS — R000 Tachycardia, unspecified: Secondary | ICD-10-CM | POA: Insufficient documentation

## 2021-04-16 LAB — ECHOCARDIOGRAM COMPLETE
Area-P 1/2: 3.65 cm2
S' Lateral: 2.7 cm

## 2021-04-20 ENCOUNTER — Encounter (INDEPENDENT_AMBULATORY_CARE_PROVIDER_SITE_OTHER): Payer: Self-pay | Admitting: Family Medicine

## 2021-04-20 ENCOUNTER — Ambulatory Visit (INDEPENDENT_AMBULATORY_CARE_PROVIDER_SITE_OTHER): Payer: BC Managed Care – PPO | Admitting: Family Medicine

## 2021-04-20 VITALS — BP 100/67 | HR 83 | Temp 97.9°F | Ht 62.0 in | Wt 165.0 lb

## 2021-04-20 DIAGNOSIS — R7303 Prediabetes: Secondary | ICD-10-CM | POA: Diagnosis not present

## 2021-04-20 DIAGNOSIS — Z9189 Other specified personal risk factors, not elsewhere classified: Secondary | ICD-10-CM

## 2021-04-20 DIAGNOSIS — E669 Obesity, unspecified: Secondary | ICD-10-CM | POA: Diagnosis not present

## 2021-04-20 DIAGNOSIS — Z6830 Body mass index (BMI) 30.0-30.9, adult: Secondary | ICD-10-CM

## 2021-04-20 DIAGNOSIS — E559 Vitamin D deficiency, unspecified: Secondary | ICD-10-CM

## 2021-04-20 DIAGNOSIS — E785 Hyperlipidemia, unspecified: Secondary | ICD-10-CM

## 2021-04-20 MED ORDER — VITAMIN D (ERGOCALCIFEROL) 1.25 MG (50000 UNIT) PO CAPS: 50000.0000 [IU] | ORAL_CAPSULE | ORAL | 0 refills | Status: DC

## 2021-04-20 MED ORDER — METFORMIN HCL ER 500 MG PO TB24: ORAL_TABLET | ORAL | 0 refills | Status: DC

## 2021-04-21 LAB — LIPID PANEL WITH LDL/HDL RATIO
Cholesterol, Total: 246 mg/dL — ABNORMAL HIGH (ref 100–199)
HDL: 61 mg/dL (ref >39)
LDL Chol Calc (NIH): 174 mg/dL — ABNORMAL HIGH (ref 0–99)
LDL/HDL Ratio: 2.9 ratio (ref 0.0–3.2)
Triglycerides: 68 mg/dL (ref 0–149)
VLDL Cholesterol Cal: 11 mg/dL (ref 5–40)

## 2021-04-21 LAB — HEMOGLOBIN A1C
Est. average glucose Bld gHb Est-mCnc: 123 mg/dL
Hgb A1c MFr Bld: 5.9 % — ABNORMAL HIGH (ref 4.8–5.6)

## 2021-04-21 LAB — CMP14+EGFR
ALT: 25 IU/L (ref 0–32)
AST: 24 IU/L (ref 0–40)
Albumin/Globulin Ratio: 1.8 (ref 1.2–2.2)
Albumin: 4.6 g/dL (ref 3.8–4.9)
Alkaline Phosphatase: 67 IU/L (ref 44–121)
BUN/Creatinine Ratio: 22 (ref 9–23)
BUN: 17 mg/dL (ref 6–24)
Bilirubin Total: 0.3 mg/dL (ref 0.0–1.2)
CO2: 23 mmol/L (ref 20–29)
Calcium: 9.7 mg/dL (ref 8.7–10.2)
Chloride: 104 mmol/L (ref 96–106)
Creatinine, Ser: 0.76 mg/dL (ref 0.57–1.00)
Globulin, Total: 2.5 g/dL (ref 1.5–4.5)
Glucose: 83 mg/dL (ref 70–99)
Potassium: 4.5 mmol/L (ref 3.5–5.2)
Sodium: 142 mmol/L (ref 134–144)
Total Protein: 7.1 g/dL (ref 6.0–8.5)
eGFR: 91 mL/min/{1.73_m2} (ref >59)

## 2021-04-21 LAB — INSULIN, RANDOM: INSULIN: 6.6 u[IU]/mL (ref 2.6–24.9)

## 2021-04-21 LAB — VITAMIN D 25 HYDROXY (VIT D DEFICIENCY, FRACTURES): Vit D, 25-Hydroxy: 90.4 ng/mL (ref 30.0–100.0)

## 2021-04-22 NOTE — Progress Notes (Signed)
? ? ? ?Chief Complaint:  ? ?OBESITY ?Samantha Andersen is here to discuss her progress with her obesity treatment plan along with follow-up of her obesity related diagnoses. Samantha Andersen is on keeping a food journal and adhering to recommended goals of 1000-1200 calories and 75+ grams of protein daily and states she is following her eating plan approximately 90% of the time. Samantha Andersen states she is walking for 40 minutes 2 times per week. ? ?Today's visit was #: 18 ?Starting weight: 186 lbs ?Starting date: 02/11/2020 ?Today's weight: 165 lbs ?Today's date: 04/20/2021 ?Total lbs lost to date: 21 ?Total lbs lost since last in-office visit: 8 ? ?Interim History: Samantha Andersen continues to do well with weight loss. She notes some increase in irritability recently with increased stress and trying to avoid stress eating.  ? ?Subjective:  ? ?1. Pre-diabetes ?Samantha Andersen is stable on metformin. She notes some irritability and wonders if this may be related to hypoglycemia.  ? ?2. Vitamin D deficiency ?Samantha Andersen is on Vitamin D, and she is due to have labs checked. She denies nausea, vomiting, or muscle weakness.  ? ?3. Hyperlipidemia, unspecified hyperlipidemia type ?Samantha Andersen is working on diet and weight loss, and she is doing well. She is due to have labs.  ? ?4. At risk for heart disease ?Samantha Andersen is at higher than average risk for cardiovascular disease due to obesity. ? ?Assessment/Plan:  ? ?1. Pre-diabetes ?We will check labs today, and we will refill metformin for 1 month. Samantha Andersen is ok to take 1 PO q AM and 1 PO q PM to see if this helps. No skipping meals. Samantha Andersen will continue to work on weight loss, exercise, and decreasing simple carbohydrates to help decrease the risk of diabetes.  ? ?- metFORMIN (GLUCOPHAGE-XR) 500 MG 24 hr tablet; TAKE 2 TABLETS BY MOUTH EVERY DAY  Dispense: 60 tablet; Refill: 0 ?- Insulin, random ?- Hemoglobin A1c ?- CMP14+EGFR ? ?2. Vitamin D deficiency ?We will refill prescription Vitamin D for 1 month. We will check labs today, and Samantha Andersen  will follow-up for routine testing of Vitamin D, at least 2-3 times per year to avoid over-replacement. ? ?- Vitamin D, Ergocalciferol, (DRISDOL) 1.25 MG (50000 UNIT) CAPS capsule; Take 1 capsule (50,000 Units total) by mouth every 7 (seven) days.  Dispense: 4 capsule; Refill: 0 ?- VITAMIN D 25 Hydroxy (Vit-D Deficiency, Fractures) ? ?3. Hyperlipidemia, unspecified hyperlipidemia type ?Cardiovascular risk and specific lipid/LDL goals reviewed. We discussed several lifestyle modifications today. We will check labs today. Samantha Andersen will continue her diet, exercise and weight loss efforts. Orders and follow up as documented in patient record.  ? ?- Lipid Panel With LDL/HDL Ratio ? ?4. At risk for heart disease ?Samantha Andersen was given approximately 15 minutes of coronary artery disease prevention counseling today. She is 56 y.o. female and has risk factors for heart disease including obesity. We discussed intensive lifestyle modifications today with an emphasis on specific weight loss instructions and strategies. ? ?Repetitive spaced learning was employed today to elicit superior memory formation and behavioral change.  ? ?5. Obesity with current  BMI of 30.3 ?Samantha Andersen is currently in the action stage of change. As such, her goal is to continue with weight loss efforts. She has agreed to keeping a food journal and adhering to recommended goals of 1000-1200 calories and 70+ grams of protein daily.  ? ?Exercise goals: As is.  ? ?Behavioral modification strategies: increasing lean protein intake, meal planning and cooking strategies, and emotional eating strategies. ? ?Samantha Andersen has agreed to follow-up  with our clinic in 4 weeks. She was informed of the importance of frequent follow-up visits to maximize her success with intensive lifestyle modifications for her multiple health conditions.  ? ?Objective:  ? ?Blood pressure 100/67, pulse 83, temperature 97.9 ?F (36.6 ?C), height 5' 2"  (1.575 m), weight 165 lb (74.8 kg), SpO2 98 %. ?Body mass  index is 30.18 kg/m?. ? ?General: Cooperative, alert, well developed, in no acute distress. ?HEENT: Conjunctivae and lids unremarkable. ?Cardiovascular: Regular rhythm.  ?Lungs: Normal work of breathing. ?Neurologic: No focal deficits.  ? ?Lab Results  ?Component Value Date  ? CREATININE 0.76 04/20/2021  ? BUN 17 04/20/2021  ? NA 142 04/20/2021  ? K 4.5 04/20/2021  ? CL 104 04/20/2021  ? CO2 23 04/20/2021  ? ?Lab Results  ?Component Value Date  ? ALT 25 04/20/2021  ? AST 24 04/20/2021  ? ALKPHOS 67 04/20/2021  ? BILITOT 0.3 04/20/2021  ? ?Lab Results  ?Component Value Date  ? HGBA1C 5.9 (H) 04/20/2021  ? HGBA1C 6.0 (H) 10/13/2020  ? HGBA1C 6.2 (H) 02/11/2020  ? ?Lab Results  ?Component Value Date  ? INSULIN 6.6 04/20/2021  ? INSULIN 13.2 10/13/2020  ? INSULIN 11.7 02/11/2020  ? ?Lab Results  ?Component Value Date  ? TSH 2.110 02/11/2020  ? ?Lab Results  ?Component Value Date  ? CHOL 246 (H) 04/20/2021  ? HDL 61 04/20/2021  ? LDLCALC 174 (H) 04/20/2021  ? TRIG 68 04/20/2021  ? CHOLHDL 2.2 12/26/2019  ? ?Lab Results  ?Component Value Date  ? VD25OH 90.4 04/20/2021  ? VD25OH 53.2 10/13/2020  ? VD25OH 31.4 02/11/2020  ? ?Lab Results  ?Component Value Date  ? WBC 6.8 10/13/2020  ? HGB 13.4 10/13/2020  ? HCT 41.5 10/13/2020  ? MCV 92 10/13/2020  ? PLT 375 10/13/2020  ? ?Lab Results  ?Component Value Date  ? IRON 69 12/15/2016  ? TIBC 296 12/15/2016  ? FERRITIN 42 12/15/2016  ? ?Attestation Statements:  ? ?Reviewed by clinician on day of visit: allergies, medications, problem list, medical history, surgical history, family history, social history, and previous encounter notes. ? ? ?I, Trixie Dredge, am acting as transcriptionist for Dennard Nip, MD. ? ?I have reviewed the above documentation for accuracy and completeness, and I agree with the above. -  Dennard Nip, MD ? ? ?

## 2021-04-24 ENCOUNTER — Encounter: Payer: Self-pay | Admitting: Internal Medicine

## 2021-05-02 NOTE — Progress Notes (Signed)
Patient ID: MEDIA PIZZINI                 DOB: 11/07/1965                    MRN: [id number] ? ? ? ? ?HPI: ?Samantha Andersen is a 56 y.o. female patient referred to lipid clinic by Rebekah Chesterfield, NP. PMH is significant for  breast CA, PCOS, anemia, anxiety, HLD, celiac disease, ankylosing spondylitis, inappropriate sinus tach. Pt has a history of statin intolerance and was on Nexlizet. A prior Josem Kaufmann was previously approved for Repatha in Jul 2021, but pt stayed on Nexlizet at the time. Pt saw Ambrose Pancoast, NP on 04/07/21 and pt reported having occasional bouts of dizziness and chest pain after starting Nexlizet so she discontinued it. Pt had cardiac CTA in 2017 that showed minimal CAD and was scheduled for another one to evaluate SOB and atypical chest pain symptoms. Cardiac CTA done Mar 2023 showed calcium score of 10 (78th percentile for age, sex, race) and mild non-obstructive CAD (25-49%). Pt was referred to lipid clinic to discuss further options regarding medications given intolerance to statins and nexlizet + her LDL being above goal. ? ?Pt presents today to lipid clinic appt. Pt has been off Nexlizet since Jan 2023 because it caused her excruciating headaches. States she had them ever since starting. She eventually stopped it and feels much better since. Pt does not remember if she has tried other statins besides atorvastatin and rosuvastatin, but she did not tolerant those two. Pt asked why she has had a sweet smell in her mouth since her cardiac CT scan. States she has it all day long, every day and nothing has helped. She has also been getting painful charley horses even after increasing her water intake to 60 oz per day.  ? ?Current Medications: None ?Intolerances: statins (atorvastatin and rosuvastatin) - rash and muscle weakness, nexlizet - increased dizziness/fatigue/SOB (d/c Jan 2023) ? ?Risk Factors: ASCVD, HLD ? ?LDL goal: <70 mg/dL ? ?Diet: Not discussed today ? ?Exercise: Does water aerobics and  yoga. Wants to start back walking but is having foot pain ? ?Family History: heart disease (father), HLD (father, mother) ? ?Social History: Used to smoke (quit 35 year ago).  ? ?Labs: ?04/20/21: TC 246, TG 68, HDL 61, LDL 174 (no medication) ?10/13/20: TC 161, TG 105, HDL 59, LDL 83 (Nexlizet) ? ?Past Medical History:  ?Diagnosis Date  ? Anemia   ? Anxiety   ? B12 deficiency   ? Back pain   ? Breast cancer ([place])   ? left  ? Cancer [facility])   ? Celiac disease   ? Complication of anesthesia   ? tachycardic, also reports that she needed increased anesth. because she has a high threshold for medicine/sedation  ? Esophagitis   ? GERD (gastroesophageal reflux disease)   ? Goiter   ? 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 Hemochromatosis carrier   ? History of gestational diabetes   ? Hot flashes   ? Hyperlipidemia   ? Inappropriate sinus tachycardia   ? Infertility associated with anovulation   ? Joint pain   ? Mild CAD   ? a. mild nonobstructive CAD (0-25% prox RCA, 0-25% prox LAD).   ? Other calcification of muscle, unspecified ankle and foot   ? Other fatigue   ? PCOS (polycystic ovarian syndrome)   ? PCOS (polycystic ovarian syndrome)   ? PONV (postoperative nausea and vomiting)   ?  also reports N&V accompanies anxiety  ? Shortness of breath on exertion   ? Skin cancer   ? Spondyloarthritis   ? Swallowing difficulty   ? Vitamin D deficiency   ? ? ?Current Outpatient Medications on File Prior to Visit  ?Medication Sig Dispense Refill  ? acetaminophen (TYLENOL) 325 MG tablet Take 650 mg by mouth as needed.    ? aspirin EC 81 MG tablet Take 1 tablet (81 mg total) by mouth daily. Swallow whole. 90 tablet 3  ? dexlansoprazole (DEXILANT) 60 MG capsule Take 60 mg by mouth daily.    ? metFORMIN (GLUCOPHAGE-XR) 500 MG 24 hr tablet TAKE 2 TABLETS BY MOUTH EVERY DAY 60 tablet 0  ? Vitamin D, Ergocalciferol, (DRISDOL) 1.25 MG (50000 UNIT) CAPS capsule Take 1 capsule (50,000 Units total) by mouth every 7 (seven) days. 4 capsule 0  ? ?No  current facility-administered medications on file prior to visit.  ? ? ?Allergies  ?Allergen Reactions  ? Doxycycline Shortness Of Breath  ? Gluten Meal Shortness Of Breath  ? Other Shortness Of Breath and Other (See Comments)  ?  SQUASH  ? Tetracyclines & Related Shortness Of Breath and Other (See Comments)  ?  Lethargy ?  ? Metformin And Related   ?  Patient prefers to take Tevo brand metformin XR due to sensitivity rection to other brands. Patient is allergic to gluten and must have gluten free Metformin  ? Statins Rash  ? ?Assessment/Plan: ? ?1. Hyperlipidemia - LDL 174 above goal <70. Discussed safety and efficacy profile of PCSK9 inhibitors. Pt amendable to starting PCSK9i pending insurance coverage to bring her LDL to goal <70. Educated pt on potential side effects and showed pt how to administer the injection. Will submit prior auth for Repatha and follow-up with pt once approved. Educated pt on modifiable risk factors and how those also have an affect on LDL. Will schedule labs in 2 to 3 months. ? ?Unsure why pt is having a sweet smell in her mouth after the cardiac CT scan. Advised pt she could try Biotene even though she does not have dry mouth to see if it helps. ? ?Patient seen with Debria Garret, [place] pharmacy student ? ?Ramond Dial, Pharm.D, BCPS, CPP ?[id number], [place], [place] [address]  ?Phone: [phone]; Fax: [phone]  ? ?

## 2021-05-03 ENCOUNTER — Ambulatory Visit: Payer: BC Managed Care – PPO | Admitting: Pharmacist

## 2021-05-03 ENCOUNTER — Ambulatory Visit
Admission: RE | Admit: 2021-05-03 | Discharge: 2021-05-03 | Disposition: A | Discharge status: Home / Self Care | Payer: BC Managed Care – PPO | Source: Ambulatory Visit | Attending: Internal Medicine | Admitting: Internal Medicine

## 2021-05-03 ENCOUNTER — Encounter: Payer: Self-pay | Admitting: Adult Health

## 2021-05-03 DIAGNOSIS — E785 Hyperlipidemia, unspecified: Secondary | ICD-10-CM

## 2021-05-03 DIAGNOSIS — Z853 Personal history of malignant neoplasm of breast: Secondary | ICD-10-CM

## 2021-05-03 DIAGNOSIS — N6489 Other specified disorders of breast: Secondary | ICD-10-CM | POA: Diagnosis not present

## 2021-05-03 NOTE — Patient Instructions (Addendum)
Your LDL goal is <70 mg/dL ? ?Will submit prior auth for Repatha and call when we hear back. ? ?Will check labs in 2 to 3 months ?

## 2021-05-06 ENCOUNTER — Encounter: Payer: Self-pay | Admitting: Pharmacist

## 2021-05-21 DIAGNOSIS — S61431A Puncture wound without foreign body of right hand, initial encounter: Secondary | ICD-10-CM | POA: Diagnosis not present

## 2021-05-24 ENCOUNTER — Ambulatory Visit (INDEPENDENT_AMBULATORY_CARE_PROVIDER_SITE_OTHER): Payer: BC Managed Care – PPO | Admitting: Family Medicine

## 2021-06-01 ENCOUNTER — Telehealth: Payer: Self-pay

## 2021-06-01 NOTE — Telephone Encounter (Signed)
-----   Message from Ramond Dial, [place] sent at 06/01/2021  2:19 PM EDT ----- Are you sure that wasn't the original? I had submitted an appeals ----- Message ----- From: Allean Found, CMA Sent: 06/01/2021   8:14 AM EDT To: Ramond Dial, RPH-CPP  Denied I found on fax machine ----- Message ----- From: Ramond Dial, RPH-CPP Sent: 06/01/2021   7:36 AM EDT To: Allean Found, CMA  Can you please call and follow up on this appeals? ----- Message ----- From: Ramond Dial, RPH-CPP Sent: 06/01/2021  12:00 AM EDT To: Ramond Dial, RPH-CPP  Follow up appeals ----- Message ----- From: Ramond Dial, RPH-CPP Sent: 05/04/2021  12:00 AM EDT To: Ramond Dial, RPH-CPP  Follow up PA

## 2021-06-01 NOTE — Telephone Encounter (Signed)
Called and spoke to a representative from [employer] to check appeal status of the repatha sureclick '140mg'$  q2w and they stated that the repatha was in fact denied deemed not medically necessary. You have the option to do a 2nd level appeal review basically by members who are not employees of [employer] by sending a written request to this address: [place] APPEALS DPT LEVEL 2 [address] [place] [address] [place]

## 2021-06-02 MED ORDER — REPATHA SURECLICK 140 MG/ML ~~LOC~~ SOAJ: 1.0000 "pen " | SUBCUTANEOUS | 11 refills | Status: DC

## 2021-06-02 NOTE — Telephone Encounter (Signed)
Submitted new request under the ASCVD diagnosis (angina). PA approved through 06/01/22. RX sent to pharmacy. Copay card activated and mychart message sent to patient.

## 2021-06-14 ENCOUNTER — Ambulatory Visit (INDEPENDENT_AMBULATORY_CARE_PROVIDER_SITE_OTHER): Payer: BC Managed Care – PPO | Admitting: Family Medicine

## 2021-06-14 ENCOUNTER — Encounter (INDEPENDENT_AMBULATORY_CARE_PROVIDER_SITE_OTHER): Payer: Self-pay | Admitting: Family Medicine

## 2021-06-14 VITALS — BP 98/66 | HR 84 | Temp 98.5°F | Ht 62.0 in | Wt 166.0 lb

## 2021-06-14 DIAGNOSIS — Z9189 Other specified personal risk factors, not elsewhere classified: Secondary | ICD-10-CM

## 2021-06-14 DIAGNOSIS — Z7984 Long term (current) use of oral hypoglycemic drugs: Secondary | ICD-10-CM

## 2021-06-14 DIAGNOSIS — E669 Obesity, unspecified: Secondary | ICD-10-CM | POA: Diagnosis not present

## 2021-06-14 DIAGNOSIS — Z6830 Body mass index (BMI) 30.0-30.9, adult: Secondary | ICD-10-CM

## 2021-06-14 DIAGNOSIS — R7303 Prediabetes: Secondary | ICD-10-CM

## 2021-06-14 DIAGNOSIS — E559 Vitamin D deficiency, unspecified: Secondary | ICD-10-CM | POA: Diagnosis not present

## 2021-06-14 DIAGNOSIS — H524 Presbyopia: Secondary | ICD-10-CM | POA: Diagnosis not present

## 2021-06-14 MED ORDER — METFORMIN HCL ER 500 MG PO TB24: ORAL_TABLET | ORAL | 0 refills | Status: DC

## 2021-06-14 MED ORDER — VITAMIN D (ERGOCALCIFEROL) 1.25 MG (50000 UNIT) PO CAPS: 50000.0000 [IU] | ORAL_CAPSULE | ORAL | 0 refills | Status: AC

## 2021-06-16 NOTE — Progress Notes (Signed)
Chief Complaint:   OBESITY Samantha Andersen is here to discuss her progress with her obesity treatment plan along with follow-up of her obesity related diagnoses. Samantha Andersen is on keeping a food journal and adhering to recommended goals of 1000-1200 calories and 70+ grams of protein daily and states she is following her eating plan approximately 70% of the time. Samantha Andersen states she is active while moving and cleaning for 60 minutes 7 times per week.  Today's visit was #: 21 Starting weight: 186 lbs Starting date: 02/11/2020 Today's weight: 166 lbs Today's date: 06/14/2021 Total lbs lost to date: 20 Total lbs lost since last in-office visit: 0  Interim History: Samantha Andersen has done well with maintaining her weight. She has been busy in the last 2 months but very active she is trying to be mindful of her eating.   Subjective:   1. Pre-diabetes Samantha Andersen's Vitamin D is improved and is now close to being over-replacement.   2. Vitamin D deficiency Samantha Andersen's A1c, glucose, and fasting insulin are all improving with her diet, exercise, and weight loss, and metformin. No side effects were noted.   3. At risk for diabetes mellitus Samantha Andersen is at higher than average risk for developing diabetes due to her obesity.  Assessment/Plan:   1. Pre-diabetes Samantha Andersen will continue to work on weight loss, exercise, and decreasing simple carbohydrates to help decrease the risk of diabetes.   - metFORMIN (GLUCOPHAGE-XR) 500 MG 24 hr tablet; TAKE 2 TABLETS BY MOUTH EVERY DAY  Dispense: 60 tablet; Refill: 0  2. Vitamin D deficiency We will refill prescription Vitamin D for 90 days (she I to change to once per month). Samantha Andersen will follow-up for routine testing of Vitamin D, at least 2-3 times per year to avoid over-replacement.  - Vitamin D, Ergocalciferol, (DRISDOL) 1.25 MG (50000 UNIT) CAPS capsule; Take 1 capsule (50,000 Units total) by mouth every 30 (thirty) days.  Dispense: 3 capsule; Refill: 0  3. At risk for diabetes  mellitus Samantha Andersen was given approximately 15 minutes of diabetic education and counseling today. We discussed intensive lifestyle modifications today with an emphasis on weight loss as well as increasing exercise and decreasing simple carbohydrates in her diet. We also reviewed medication options with an emphasis on risk versus benefits of those discussed.  Repetitive spaced learning was employed today to elicit superior memory formation and behavioral change.  4. Obesity with current  BMI of 30.5 Samantha Andersen is currently in the action stage of change. As such, her goal is to continue with weight loss efforts. She has agreed to keeping a food journal and adhering to recommended goals of 1000-1200 calories and 70+ grams of protein daily.   Behavioral modification strategies: meal planning and cooking strategies.  Samantha Andersen has agreed to follow-up with our clinic in 4 weeks. She was informed of the importance of frequent follow-up visits to maximize her success with intensive lifestyle modifications for her multiple health conditions.   Objective:   Blood pressure 98/66, pulse 84, temperature 98.5 F (36.9 C), height '5\' 2"'$  (1.575 m), weight 166 lb (75.3 kg), SpO2 97 %. Body mass index is 30.36 kg/m.  General: Cooperative, alert, well developed, in no acute distress. HEENT: Conjunctivae and lids unremarkable. Cardiovascular: Regular rhythm.  Lungs: Normal work of breathing. Neurologic: No focal deficits.   Lab Results  Component Value Date   CREATININE 0.76 04/20/2021   BUN 17 04/20/2021   NA 142 04/20/2021   K 4.5 04/20/2021   CL 104 04/20/2021  CO2 23 04/20/2021   Lab Results  Component Value Date   ALT 25 04/20/2021   AST 24 04/20/2021   ALKPHOS 67 04/20/2021   BILITOT 0.3 04/20/2021   Lab Results  Component Value Date   HGBA1C 5.9 (H) 04/20/2021   HGBA1C 6.0 (H) 10/13/2020   HGBA1C 6.2 (H) 02/11/2020   Lab Results  Component Value Date   INSULIN 6.6 04/20/2021   INSULIN 13.2  10/13/2020   INSULIN 11.7 02/11/2020   Lab Results  Component Value Date   TSH 2.110 02/11/2020   Lab Results  Component Value Date   CHOL 246 (H) 04/20/2021   HDL 61 04/20/2021   LDLCALC 174 (H) 04/20/2021   TRIG 68 04/20/2021   CHOLHDL 2.2 12/26/2019   Lab Results  Component Value Date   VD25OH 90.4 04/20/2021   VD25OH 53.2 10/13/2020   VD25OH 31.4 02/11/2020   Lab Results  Component Value Date   WBC 6.8 10/13/2020   HGB 13.4 10/13/2020   HCT 41.5 10/13/2020   MCV 92 10/13/2020   PLT 375 10/13/2020   Lab Results  Component Value Date   IRON 69 12/15/2016   TIBC 296 12/15/2016   FERRITIN 42 12/15/2016   Attestation Statements:   Reviewed by clinician on day of visit: allergies, medications, problem list, medical history, surgical history, family history, social history, and previous encounter notes.   I, Trixie Dredge, am acting as transcriptionist for Dennard Nip, MD.  I have reviewed the above documentation for accuracy and completeness, and I agree with the above. -  Dennard Nip, MD

## 2021-06-24 ENCOUNTER — Telehealth: Payer: Self-pay | Admitting: Pharmacist

## 2021-06-24 DIAGNOSIS — E785 Hyperlipidemia, unspecified: Secondary | ICD-10-CM

## 2021-06-24 NOTE — Telephone Encounter (Signed)
Patient made aware Rep[facility] approved. She has already picked up and began taking. Will do labs on same day at Dr. Johney Frame apt.

## 2021-07-04 NOTE — Progress Notes (Deleted)
Cardiology Office Note:    Date:  07/04/2021   ID:  Samantha Andersen, DOB 24-Aug-1965, MRN [id number]  PCP:  Hoyt Koch, MD   [facility] Providers Cardiologist:  Freada Bergeron, MD {    Referring MD: Hoyt Koch, *     History of Present Illness:    Samantha Andersen is a 56 y.o. female with a hx of breast CA, PCOS, anemia, anxiety, HLD, celiac disease, ankylosing spondylitis, inappropriate sinus tach who was previously followed by Dr. Meda Coffee and now returns to clinic for follow-up of inappropriate sinus tach.  Per review of the record, the patient was historically followed by Dr. Meda Coffee for concerns of frequent tachycardias with 1 episode observed during diagnostic colonoscopy. 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 Stress echo 2015 was normal. 48-hour monitor 09/2014 showed inappropriate sinus tachycardia for which carvedilol was started. She did not tolerate this nor metoprolol so was switched to diltiazem. She has not tolerated multiple statins and has been on red yeast rice. Coronary CTA 05/2015 showed calcium score of zero, mild nonobstructive CAD (0-25% prox RCA, 0-25% prox LAD).   Was last seen in clinic on 05/2020 where she was doing well. Occasional palpitations but they were not overly bothersome.  Today, ***  Past Medical History:  Diagnosis Date   Anemia    Anxiety    B12 deficiency    Back pain    Breast cancer ([place])    left   Cancer ([facility])    Celiac disease    Complication of anesthesia    tachycardic, also reports that she needed increased anesth. 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 mild nonobstructive CAD (0-25% prox RCA, 0-25% prox LAD).    Other calcification of muscle, unspecified ankle and foot    Other fatigue    PCOS (polycystic ovarian syndrome)    PCOS (polycystic ovarian syndrome)    PONV (postoperative nausea and vomiting)    also reports N&V accompanies anxiety   Shortness of breath on exertion    Skin cancer    Spondyloarthritis    Swallowing difficulty    Vitamin D deficiency     Past Surgical History:  Procedure Laterality Date   ABDOMINAL HYSTERECTOMY     CESAREAN SECTION     x2   MASTECTOMY Bilateral    SIMPLE MASTECTOMY WITH AXILLARY SENTINEL NODE BIOPSY  11/24/2011   Procedure: SIMPLE MASTECTOMY WITH AXILLARY SENTINEL NODE BIOPSY;  Surgeon: Haywood Lasso, MD;  Location: [facility];  Service: General;  Laterality: Left;  Bilateral Total Mastectomy and Left Sentinel Node   SIMPLE MASTECTOMY WITH AXILLARY SENTINEL NODE BIOPSY  11/24/2011   Procedure: SIMPLE MASTECTOMY;  Surgeon: Haywood Lasso, MD;  Location: [place];  Service: General;  Laterality: Right;   TISSUE EXPANDER PLACEMENT  11/24/2011   Procedure: TISSUE EXPANDER;  Surgeon: Crissie Reese, MD;  Location: [place];  Service: Plastics;  Laterality: Bilateral;    Current Medications: No outpatient medications have been marked as taking for the 07/12/21 encounter (  Appointment) with Freada Bergeron, MD.     Allergies:   Doxycycline, Gluten meal, Other, Tetracyclines & related, Metformin and related, and Statins   Social History   Socioeconomic History   Marital status: Married    Spouse name: david   Number of children: 2   Years of education: Not on file   Highest education level: Not on file  Occupational History   Occupation: clergy  Tobacco Use   Smoking status: Former    Packs/day: 2.00    Years: 4.00    Total pack years: 8.00    Types: Cigarettes    Quit date: 10/04/1985    Years since quitting: 35.7   Smokeless tobacco:  Never  Vaping Use   Vaping Use: Never used  Substance and Sexual Activity   Alcohol use: Yes    Comment: rare   Drug use: No   Sexual activity: Yes    Partners: Male    Birth control/protection: None  Other Topics Concern   Not on file  Social History Narrative   Not on file   Social Determinants of Health   Financial Resource Strain: Not on file  Food Insecurity: Not on file  Transportation Needs: Not on file  Physical Activity: Not on file  Stress: Not on file  Social Connections: Not on file     Family History: The patient's family history includes Cancer in her mother; Cancer (age of onset: 31) in her paternal uncle; Depression in her father; Healthy in her father; Heart disease in her father; Hemochromatosis in her sister; Hyperlipidemia in her father and mother; Lung cancer in her maternal grandfather; Prostate cancer (age of onset: 83) in her paternal uncle; Sleep apnea in her father.  ROS:   Please see the history of present illness.    Review of Systems  Constitutional:  Negative for chills and fever.  HENT:  Negative for sore throat.   Eyes:  Negative for blurred vision.  Respiratory:  Positive for shortness of breath.   Cardiovascular:  Positive for chest pain and palpitations. Negative for orthopnea, claudication, leg swelling and PND.  Gastrointestinal:  Negative for nausea and vomiting.  Genitourinary:  Negative for flank pain.  Musculoskeletal:  Negative for falls.  Neurological:  Positive for dizziness. Negative for loss of consciousness.  Psychiatric/Behavioral:  Negative for substance abuse.     EKGs/Labs/Other Studies Reviewed:    The following studies were reviewed today:  ETT 01/2017: Blood pressure demonstrated a normal response to exercise. There was no ST segment deviation noted during stress. No T wave inversion was noted during stress.   Normal ECG stress test.  DSE 03/2013: Study Conclusions   - HPI and indications: Resting chest  pain.  - Stress ECG conclusions: The stress ECG was normal.  - Baseline: LV global systolic function was normal. The    estimated LV ejection fraction was 60%. Normal wall    motion; no LV regional wall motion abnormalities.  - Peak stress: LV global systolic function was vigorous.    Normal wall motion; no LV regional wall motion    abnormalities.  - Impressions: There is normal increase in thickening and    contractility in all segments. This is a normal stress    echo.  Impressions:   - There is normal increase in thickening and contractility    in all segments. This is a normal stress echo.    EKG:  EKG not performed.  Recent Labs: 10/13/2020: Hemoglobin 13.4; Platelets 375 04/20/2021:  ALT 25; BUN 17; Creatinine, Ser 0.76; Potassium 4.5; Sodium 142  Recent Lipid Panel    Component Value Date/Time   CHOL 246 (H) 04/20/2021 0952   CHOL 233 (H) 04/04/2014 1022   TRIG 68 04/20/2021 0952   TRIG 118 04/04/2014 1022   HDL 61 04/20/2021 0952   HDL 64 04/04/2014 1022   CHOLHDL 2.2 12/26/2019 0946   CHOLHDL 4 07/28/2014 0844   VLDL 24.6 07/28/2014 0844   LDLCALC 174 (H) 04/20/2021 0952   LDLCALC 145 (H) 04/04/2014 1022       Physical Exam:    VS:  There were no vitals taken for this visit.    Wt Readings from Last 3 Encounters:  06/14/21 166 lb (75.3 kg)  04/20/21 165 lb (74.8 kg)  04/07/21 173 lb 6.4 oz (78.7 kg)     GEN:  Well nourished, well developed in no acute distress HEENT: Normal NECK: No JVD; No carotid bruits CARDIAC: RRR, no murmurs, rubs, gallops RESPIRATORY:  Clear to auscultation without rales, wheezing or rhonchi  ABDOMEN: Soft, non-tender, non-distended MUSCULOSKELETAL:  No edema; No deformity  SKIN: Warm and dry NEUROLOGIC:  Alert and oriented x 3 PSYCHIATRIC:  Normal affect   ASSESSMENT:    No diagnosis found.  PLAN:    In order of problems listed above:  #Atypical Chest Pain:  Minimal disease on CTA in 2017 with Ca score 0. Normal ETT  in 2019. Doing well with only occasional chest discomfort that is non-exertional in nature. Remains active and walks 63mles/day. -Continue diet and lifestyle modifications  #Inappropriate Sinus Tachycardia: HR currently 81. Did not tolerate BB in the past. Only occasional palpitations -Doing well with no palpitations -Continue hydration -Continue daily exercise  #HLD: #Statin Intolerance: Intolerant to all statins including red yeast rice. LDL 94; given minimal disease on cath, okay for goal <100.  -Continue nexlizet 180/'10mg'$  daily  #Dizziness: Has been ongoing for the past week. No positional related or worsened by movement of the head. Suspect vertigo. Blood pressure well controlled with no hypotension. HR 80s, -Follow-up with PCP for management   Medication Adjustments/Labs and Tests Ordered: Current medicines are reviewed at length with the patient today.  Concerns regarding medicines are outlined above.  No orders of the defined types were placed in this encounter.  No orders of the defined types were placed in this encounter.   There are no Patient Instructions on file for this visit.    Signed, HFreada Bergeron MD  07/04/2021 4:03 PM    [facility]

## 2021-07-12 ENCOUNTER — Other Ambulatory Visit: Payer: BC Managed Care – PPO | Admitting: *Deleted

## 2021-07-12 ENCOUNTER — Encounter: Payer: Self-pay | Admitting: Cardiology

## 2021-07-12 ENCOUNTER — Ambulatory Visit: Payer: BC Managed Care – PPO | Admitting: Cardiology

## 2021-07-12 VITALS — BP 100/72 | HR 84 | Ht 62.0 in | Wt 176.4 lb

## 2021-07-12 DIAGNOSIS — R072 Precordial pain: Secondary | ICD-10-CM | POA: Diagnosis not present

## 2021-07-12 DIAGNOSIS — R000 Tachycardia, unspecified: Secondary | ICD-10-CM | POA: Diagnosis not present

## 2021-07-12 DIAGNOSIS — Z789 Other specified health status: Secondary | ICD-10-CM

## 2021-07-12 DIAGNOSIS — M791 Myalgia, unspecified site: Secondary | ICD-10-CM

## 2021-07-12 DIAGNOSIS — E785 Hyperlipidemia, unspecified: Secondary | ICD-10-CM | POA: Diagnosis not present

## 2021-07-12 DIAGNOSIS — T466X5A Adverse effect of antihyperlipidemic and antiarteriosclerotic drugs, initial encounter: Secondary | ICD-10-CM

## 2021-07-12 DIAGNOSIS — T466X5D Adverse effect of antihyperlipidemic and antiarteriosclerotic drugs, subsequent encounter: Secondary | ICD-10-CM

## 2021-07-12 DIAGNOSIS — E7800 Pure hypercholesterolemia, unspecified: Secondary | ICD-10-CM

## 2021-07-12 NOTE — Patient Instructions (Signed)
Medication Instructions:  Your physician recommends that you continue on your current medications as directed. Please refer to the Current Medication list given to you today.  *If you need a refill on your cardiac medications before your next appointment, please call your pharmacy*   Lab Work: NONE If you have labs (blood work) drawn today and your tests are completely normal, you will receive your results only by: [place] (if you have MyChart) OR A paper copy in the mail If you have any lab test that is abnormal or we need to change your treatment, we will call you to review the results.   Testing/Procedures: NONE   Follow-Up: At [facility], you and your health needs are our priority.  As part of our continuing mission to provide you with exceptional heart care, we have created designated Provider Care Teams.  These Care Teams include your primary Cardiologist (physician) and Advanced Practice Providers (APPs -  Physician Assistants and Nurse Practitioners) who all work together to provide you with the care you need, when you need it.     Your next appointment:   1 year(s)  The format for your next appointment:   In Person  Provider:   Freada Bergeron, MD    Important Information About Sugar

## 2021-07-12 NOTE — Progress Notes (Signed)
Cardiology Office Note:    Date:  07/12/2021   ID:  Samantha Andersen, DOB 1965/01/27, MRN [id number]  PCP:  Hoyt Koch, MD   [facility] Providers Cardiologist:  Freada Bergeron, MD {    Referring MD: Hoyt Koch, *     History of Present Illness:    Samantha Andersen is a 56 y.o. female with a hx of breast CA, PCOS, anemia, anxiety, HLD, celiac disease, ankylosing spondylitis, inappropriate sinus tach who was previously followed by Dr. Meda Coffee and now returns to clinic for follow-up of inappropriate sinus tach.  Per review of the record, the patient was historically followed by Dr. Meda Coffee for concerns of frequent tachycardias with 1 episode observed during diagnostic colonoscopy. 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 Stress echo 2015 was normal. 48-hour monitor 09/2014 showed inappropriate sinus tachycardia for which carvedilol was started. She did not tolerate this nor metoprolol so was switched to diltiazem. She has not tolerated multiple statins and has been on red yeast rice. Coronary CTA 05/2015 showed calcium score of zero, mild nonobstructive CAD (0-25% prox RCA, 0-25% prox LAD).   Was last seen in clinic on 05/2020 where she was doing well. Occasional palpitations but they were not overly bothersome.  Today, the patient states that her palpitations are fine and are not bothersome. Currently she is off of diltiazem. Also she reports her dizziness has resolved at this time, and she denies any recurring chest pain.   She endorses mild swelling that occurs with walking outside, especially in the heat.  Ever since her recent CT scan she has a lingering sweet taste in her mouth. She had gestational diabetes, but she denies diabetes at this time.   She reports doing well on Repatha, which was started about a month ago.    Recently she had two tick bites, which she believes was from 1 dog tick  and 1 deer tick. Ever since then she has suffered from myalgias and aches.   She is taking 81 mg ASA every other day.  She denies any palpitations, chest pain, shortness of breath, or peripheral edema. No lightheadedness, headaches, syncope, orthopnea, or PND.  Past Medical History:  Diagnosis Date   Anemia    Anxiety    B12 deficiency    Back pain    Breast cancer (HCC)    left   Cancer (HCC)    Celiac disease    Complication of anesthesia    tachycardic, also reports that she needed increased anesth. 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 mild nonobstructive CAD (0-25% prox RCA, 0-25% prox LAD).    Other calcification of muscle, unspecified ankle and foot    Other fatigue    PCOS (polycystic ovarian syndrome)    PCOS (polycystic ovarian syndrome)    PONV (postoperative nausea and vomiting)    also reports N&V accompanies anxiety   Shortness of breath on exertion    Skin cancer    Spondyloarthritis    Swallowing difficulty    Vitamin D  deficiency     Past Surgical History:  Procedure Laterality Date   ABDOMINAL HYSTERECTOMY     CESAREAN SECTION     x2   MASTECTOMY Bilateral    SIMPLE MASTECTOMY WITH AXILLARY SENTINEL NODE BIOPSY  11/24/2011   Procedure: SIMPLE MASTECTOMY WITH AXILLARY SENTINEL NODE BIOPSY;  Surgeon: Haywood Lasso, MD;  Location: [place];  Service: General;  Laterality: Left;  Bilateral Total Mastectomy and Left Sentinel Node   SIMPLE MASTECTOMY WITH AXILLARY SENTINEL NODE BIOPSY  11/24/2011   Procedure: SIMPLE MASTECTOMY;  Surgeon: Haywood Lasso, MD;  Location: [place];  Service: General;  Laterality: Right;   TISSUE  EXPANDER PLACEMENT  11/24/2011   Procedure: TISSUE EXPANDER;  Surgeon: Crissie Reese, MD;  Location: [place];  Service: Plastics;  Laterality: Bilateral;    Current Medications: Current Meds  Medication Sig   acetaminophen (TYLENOL) 325 MG tablet Take 650 mg by mouth as needed.   aspirin EC 81 MG tablet Take 1 tablet (81 mg total) by mouth daily. Swallow whole.   dexlansoprazole (DEXILANT) 60 MG capsule Take 60 mg by mouth daily.   Evolocumab (REPATHA SURECLICK) 371 MG/ML SOAJ Inject 1 pen. into the skin every 14 (fourteen) days.   metFORMIN (GLUCOPHAGE-XR) 500 MG 24 hr tablet TAKE 2 TABLETS BY MOUTH EVERY DAY   Vitamin D, Ergocalciferol, (DRISDOL) 1.25 MG (50000 UNIT) CAPS capsule Take 1 capsule (50,000 Units total) by mouth every 30 (thirty) days.     Allergies:   Doxycycline, Gluten meal, Other, Tetracyclines & related, Metformin and related, and Statins   Social History   Socioeconomic History   Marital status: Married    Spouse name: david   Number of children: 2   Years of education: Not on file   Highest education level: Not on file  Occupational History   Occupation: clergy  Tobacco Use   Smoking status: Former    Packs/day: 2.00    Years: 4.00    Total pack years: 8.00    Types: Cigarettes    Quit date: 10/04/1985    Years since quitting: 35.7   Smokeless tobacco: Never  Vaping Use   Vaping Use: Never used  Substance and Sexual Activity   Alcohol use: Yes    Comment: rare   Drug use: No   Sexual activity: Yes    Partners: Male    Birth control/protection: None  Other Topics Concern   Not on file  Social History Narrative   Not on file   Social Determinants of Health   Financial Resource Strain: Not on file  Food Insecurity: Not on file  Transportation Needs: Not on file  Physical Activity: Not on file  Stress: Not on file  Social Connections: Not on file     Family History: The patient's family history includes Cancer in her mother; Cancer (age of  onset: 70) in her paternal uncle; Depression in her father; Healthy in her father; Heart disease in her father; Hemochromatosis in her sister; Hyperlipidemia in her father and mother; Lung cancer in her maternal grandfather; Prostate cancer (age of onset: 49) in her paternal uncle; Sleep apnea in her father.  ROS:   Please see the history of present illness.    Review of Systems  Constitutional:  Negative for chills and fever.  HENT:  Negative for sore throat.   Eyes:  Negative for blurred vision.  Respiratory:  Negative for shortness of breath.   Cardiovascular:  Positive for palpitations and leg swelling. Negative for  chest pain, orthopnea, claudication and PND.  Gastrointestinal:  Negative for nausea and vomiting.  Genitourinary:  Negative for flank pain.  Musculoskeletal:  Positive for myalgias. Negative for falls.  Neurological:  Negative for dizziness and loss of consciousness.  Psychiatric/Behavioral:  Negative for substance abuse.     EKGs/Labs/Other Studies Reviewed:    The following studies were reviewed today:  Echocardiogram  04/16/2021:  1. Left ventricular ejection fraction, by estimation, is 60 to 65%. The  left ventricle has normal function. The left ventricle has no regional  wall motion abnormalities. Left ventricular diastolic parameters were  normal.   2. Right ventricular systolic function is normal. The right ventricular  size is normal.   3. Left atrial size was mildly dilated.   4. The mitral valve is normal in structure. Trivial mitral valve  regurgitation. No evidence of mitral stenosis.   5. The aortic valve is normal in structure. Aortic valve regurgitation is  not visualized. Aortic valve sclerosis/calcification is present, without  any evidence of aortic stenosis.   6. The inferior vena cava is normal in size with greater than 50%  respiratory variability, suggesting right atrial pressure of 3 mmHg.   Coronary CT 04/09/2021: FINDINGS: Image quality:  Good   Noise artifact is: Limited   Coronary calcium score is 10, which places the patient in the 78th percentile for age and sex matched control.   Coronary arteries: Normal coronary origins.  Right dominance.   Right Coronary Artery: Mild mixed atherosclerotic plaque in the proximal RCA, 25-49% stenosis. Minimal mixed atherosclerotic plaque in the distal RCA, <25% stenosis.   Left Main Coronary Artery: No detectable plaque or stenosis.   Left Anterior Descending Coronary Artery: Minimal mixed atherosclerotic plaque in the proximal first diagonal artery, mid LAD, and distal LAD, <25% stenosis.   Left Circumflex Artery: Minimal mixed atherosclerotic plaque in the proximal L circumflex artery, <25% stenosis.   Aorta: Normal size, 35 mm at the mid ascending aorta (level of the PA bifurcation) measured double oblique. No calcifications. No dissection.   Aortic Valve: No calcifications.   Other findings:   Normal pulmonary vein drainage into the left atrium.   Normal left atrial appendage without thrombus.   Normal size of the pulmonary artery.   Probable PFO with small left to right shunt.   IMPRESSION: 1. Mild CAD in proximal RCA, CADRADS = 2.   2. Coronary calcium score is 10, which places the patient in the 78th percentile for age and sex matched control.   3. Normal coronary origins with right dominance.   CAD-RADS 2. Mild non-obstructive CAD (25-49%). Consider non-atherosclerotic causes of chest pain. Consider preventive therapy and risk factor modification.    ETT 01/2017: Blood pressure demonstrated a normal response to exercise. There was no ST segment deviation noted during stress. No T wave inversion was noted during stress.   Normal ECG stress test.  DSE 03/2013: Study Conclusions   - HPI and indications: Resting chest pain.  - Stress ECG conclusions: The stress ECG was normal.  - Baseline: LV global systolic function was normal. The     estimated LV ejection fraction was 60%. Normal wall    motion; no LV regional wall motion abnormalities.  - Peak stress: LV global systolic function was vigorous.    Normal wall motion; no LV regional wall motion    abnormalities.  - Impressions: There is normal increase in thickening and    contractility in all segments. This is a normal stress  echo.  Impressions:   - There is normal increase in thickening and contractility    in all segments. This is a normal stress echo.    EKG:  EKG is personally reviewed. 07/12/2021:  EKG was not ordered. 04/07/2021 Ambrose Pancoast, NP):  sinus rhythm with rate of 77- no acute changes.   Recent Labs: 10/13/2020: Hemoglobin 13.4; Platelets 375 04/20/2021: ALT 25; BUN 17; Creatinine, Ser 0.76; Potassium 4.5; Sodium 142  Recent Lipid Panel    Component Value Date/Time   CHOL 246 (H) 04/20/2021 0952   CHOL 233 (H) 04/04/2014 1022   TRIG 68 04/20/2021 0952   TRIG 118 04/04/2014 1022   HDL 61 04/20/2021 0952   HDL 64 04/04/2014 1022   CHOLHDL 2.2 12/26/2019 0946   CHOLHDL 4 07/28/2014 0844   VLDL 24.6 07/28/2014 0844   LDLCALC 174 (H) 04/20/2021 0952   LDLCALC 145 (H) 04/04/2014 1022       Physical Exam:    VS:  BP 100/72   Pulse 84   Ht '5\' 2"'$  (1.575 m)   Wt 176 lb 6.4 oz (80 kg)   SpO2 94%   BMI 32.26 kg/m     Wt Readings from Last 3 Encounters:  07/12/21 176 lb 6.4 oz (80 kg)  06/14/21 166 lb (75.3 kg)  04/20/21 165 lb (74.8 kg)     GEN:  Well nourished, well developed in no acute distress HEENT: Normal NECK: No JVD; No carotid bruits CARDIAC: RRR, no murmurs, rubs, gallops RESPIRATORY:  Clear to auscultation without rales, wheezing or rhonchi  ABDOMEN: Soft, non-tender, non-distended MUSCULOSKELETAL:  No edema; No deformity  SKIN: Warm and dry NEUROLOGIC:  Alert and oriented x 3 PSYCHIATRIC:  Normal affect   ASSESSMENT:    1. Precordial pain   2. Hyperlipidemia, unspecified hyperlipidemia type   3. Inappropriate  sinus tachycardia   4. Myalgia due to statin   5. Pure hypercholesterolemia   6. Statin intolerance     PLAN:    In order of problems listed above:  #Atypical Chest Pain:  Mild disease on coronary CTA 03/2021 with Ca score 0. TTE with normal BiV function and no significant valve disease. Remains active and walks 57mles/day. -Continue diet and lifestyle modifications -Continue repatha 140 q 2weeks  #Inappropriate Sinus Tachycardia: HR currently 81. Did not tolerate BB in the past. Only occasional palpitations -Doing well with no palpitations -Continue hydration -Continue daily exercise  #HLD: #Statin Intolerance: Intolerant to all statins including red yeast rice. Now on repatha and doing well. -Continue repatha '140mg'$  q 2 weeks -Repeat lipids today  #Dizziness: Resolved.    Medication Adjustments/Labs and Tests Ordered: Current medicines are reviewed at length with the patient today.  Concerns regarding medicines are outlined above.  No orders of the defined types were placed in this encounter.  No orders of the defined types were placed in this encounter.   Patient Instructions  Medication Instructions:  Your physician recommends that you continue on your current medications as directed. Please refer to the Current Medication list given to you today.  *If you need a refill on your cardiac medications before your next appointment, please call your pharmacy*   Lab Work: NONE If you have labs (blood work) drawn today and your tests are completely normal, you will receive your results only by: [place](if you have MyChart) OR A paper copy in the mail If you have any lab test that is abnormal or we need to change your treatment, we  will call you to review the results.   Testing/Procedures: NONE   Follow-Up: At [facility], you and your health needs are our priority.  As part of our continuing mission to provide you with exceptional heart care, we have  created designated Provider Care Teams.  These Care Teams include your primary Cardiologist (physician) and Advanced Practice Providers (APPs -  Physician Assistants and Nurse Practitioners) who all work together to provide you with the care you need, when you need it.     Your next appointment:   1 year(s)  The format for your next appointment:   In Person  Provider:   Freada Bergeron, MD    Important Information About Sugar        I,Mathew Stumpf,acting as a scribe for Freada Bergeron, MD.,have documented all relevant documentation on the behalf of Freada Bergeron, MD,as directed by  Freada Bergeron, MD while in the presence of Freada Bergeron, MD.  I, Freada Bergeron, MD, have reviewed all documentation for this visit. The documentation on 07/12/21 for the exam, diagnosis, procedures, and orders are all accurate and complete.   Signed, Freada Bergeron, MD  07/12/2021 8:29 AM    [facility]

## 2021-07-13 LAB — LIPID PANEL
Chol/HDL Ratio: 2.5 ratio (ref 0.0–4.4)
Cholesterol, Total: 182 mg/dL (ref 100–199)
HDL: 73 mg/dL (ref >39)
LDL Chol Calc (NIH): 95 mg/dL (ref 0–99)
Triglycerides: 78 mg/dL (ref 0–149)
VLDL Cholesterol Cal: 14 mg/dL (ref 5–40)

## 2021-07-13 LAB — APOLIPOPROTEIN B: Apolipoprotein B: 74 mg/dL (ref <90)

## 2021-07-14 ENCOUNTER — Ambulatory Visit (INDEPENDENT_AMBULATORY_CARE_PROVIDER_SITE_OTHER): Payer: BC Managed Care – PPO | Admitting: Family Medicine

## 2021-07-22 DIAGNOSIS — R432 Parageusia: Secondary | ICD-10-CM | POA: Diagnosis not present

## 2021-07-22 DIAGNOSIS — R7309 Other abnormal glucose: Secondary | ICD-10-CM | POA: Diagnosis not present

## 2021-07-22 DIAGNOSIS — W57XXXA Bitten or stung by nonvenomous insect and other nonvenomous arthropods, initial encounter: Secondary | ICD-10-CM | POA: Diagnosis not present

## 2021-07-26 ENCOUNTER — Encounter: Payer: Self-pay | Admitting: Internal Medicine

## 2021-08-04 DIAGNOSIS — L910 Hypertrophic scar: Secondary | ICD-10-CM | POA: Diagnosis not present

## 2021-08-04 DIAGNOSIS — L298 Other pruritus: Secondary | ICD-10-CM | POA: Diagnosis not present

## 2021-08-04 DIAGNOSIS — L309 Dermatitis, unspecified: Secondary | ICD-10-CM | POA: Diagnosis not present

## 2021-08-04 DIAGNOSIS — Z789 Other specified health status: Secondary | ICD-10-CM | POA: Diagnosis not present

## 2021-08-18 ENCOUNTER — Encounter (INDEPENDENT_AMBULATORY_CARE_PROVIDER_SITE_OTHER): Payer: Self-pay

## 2021-08-18 ENCOUNTER — Ambulatory Visit (INDEPENDENT_AMBULATORY_CARE_PROVIDER_SITE_OTHER): Payer: BC Managed Care – PPO | Admitting: Family Medicine

## 2021-08-20 ENCOUNTER — Encounter: Payer: Self-pay | Admitting: Family Medicine

## 2021-08-20 ENCOUNTER — Ambulatory Visit: Payer: BC Managed Care – PPO | Admitting: Family Medicine

## 2021-08-20 VITALS — BP 120/76 | HR 110 | Temp 97.8°F | Ht 62.0 in | Wt 182.0 lb

## 2021-08-20 DIAGNOSIS — Z8719 Personal history of other diseases of the digestive system: Secondary | ICD-10-CM | POA: Insufficient documentation

## 2021-08-20 DIAGNOSIS — R590 Localized enlarged lymph nodes: Secondary | ICD-10-CM | POA: Diagnosis not present

## 2021-08-20 DIAGNOSIS — R432 Parageusia: Secondary | ICD-10-CM | POA: Diagnosis not present

## 2021-08-20 NOTE — Progress Notes (Signed)
Subjective:     Patient ID: Samantha Andersen, female    DOB: 1965-09-21, 56 y.o.   MRN: [id number]  Chief Complaint  Patient presents with   Adenopathy    Bilateral swollen lymph nodes for a week now. Mentioned she was bit by ticks in late July and was tested for lyme disease and it came back negative.  Sores on tongue on and off for the last couple weeks.     HPI Patient is in today for bilateral cervical lymphadenopathy x 2 wks, improved. Denies rhinorrhea, nasal congestion, ear pain, sore throat, cough.   Intermittent sores on her tongue that lasted 4-5 days and then resolved.   Sweet taste in her mouth since February 2023. Started after heart CT. Becoming more intermittent.   2 ticks that bit her in June in [place]. She has them with her.   Negative Lyme testing in 2013 and again in July 2023.   Denies fever, chills, dizziness, chest pain, palpitations, shortness of breath, abdominal pain, N/V/D, urinary symptoms, LE edema.     Health Maintenance Due  Topic Date Due   HIV Screening  Never done   Hepatitis C Screening  Never done   Zoster Vaccines- Shingrix (1 of 2) Never done   PAP SMEAR-Modifier  07/06/2017   INFLUENZA VACCINE  08/10/2021    Past Medical History:  Diagnosis Date   Anemia    Anxiety    B12 deficiency    Back pain    Breast cancer (HCC)    left   Cancer ([place])    Celiac disease    Complication of anesthesia    tachycardic, also reports that she needed increased anesth. 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 mild nonobstructive CAD (0-25% prox RCA, 0-25% prox LAD).    Other calcification of muscle, unspecified ankle  and foot    Other fatigue    PCOS (polycystic ovarian syndrome)    PCOS (polycystic ovarian syndrome)    PONV (postoperative nausea and vomiting)    also reports N&V accompanies anxiety   Shortness of breath on exertion    Skin cancer    Spondyloarthritis    Swallowing difficulty    Vitamin D deficiency     Past Surgical History:  Procedure Laterality Date   ABDOMINAL HYSTERECTOMY     CESAREAN SECTION     x2   MASTECTOMY Bilateral    SIMPLE MASTECTOMY WITH AXILLARY SENTINEL NODE BIOPSY  11/24/2011   Procedure: SIMPLE MASTECTOMY WITH AXILLARY SENTINEL NODE BIOPSY;  Surgeon: Haywood Lasso, MD;  Location: [place];  Service: General;  Laterality: Left;  Bilateral Total Mastectomy and Left Sentinel Node   SIMPLE MASTECTOMY WITH AXILLARY SENTINEL NODE BIOPSY  11/24/2011   Procedure: SIMPLE MASTECTOMY;  Surgeon: Haywood Lasso, MD;  Location: [place];  Service: General;  Laterality: Right;   TISSUE EXPANDER PLACEMENT  11/24/2011   Procedure: TISSUE EXPANDER;  Surgeon: Crissie Reese, MD;  Location: [place];  Service: Plastics;  Laterality: Bilateral;    Family History  Problem Relation Age of Onset   Hyperlipidemia Mother  Cancer Mother    Healthy Father    Hyperlipidemia Father    Heart disease Father    Depression Father    Sleep apnea Father    Hemochromatosis Sister    Prostate cancer Paternal Uncle 23   Cancer Paternal Uncle 71       unknown type of cancer   Lung cancer Maternal Grandfather     Social History   Socioeconomic History   Marital status: Married    Spouse name: david   Number of children: 2   Years of education: Not on file   Highest education level: Not on file  Occupational History   Occupation: clergy  Tobacco Use   Smoking status: Former    Packs/day: 2.00    Years: 4.00    Total pack years: 8.00    Types: Cigarettes    Quit date: 10/04/1985    Years since quitting: 35.9   Smokeless tobacco: Never  Vaping Use   Vaping Use: Never used   Substance and Sexual Activity   Alcohol use: Yes    Comment: rare   Drug use: No   Sexual activity: Yes    Partners: Male    Birth control/protection: None  Other Topics Concern   Not on file  Social History Narrative   Not on file   Social Determinants of Health   Financial Resource Strain: Not on file  Food Insecurity: Not on file  Transportation Needs: Not on file  Physical Activity: Not on file  Stress: Not on file  Social Connections: Not on file  Intimate Partner Violence: Not on file    Outpatient Medications Prior to Visit  Medication Sig Dispense Refill   acetaminophen (TYLENOL) 325 MG tablet Take 650 mg by mouth as needed.     aspirin EC 81 MG tablet Take 1 tablet (81 mg total) by mouth daily. Swallow whole. 90 tablet 3   dexlansoprazole (DEXILANT) 60 MG capsule Take 60 mg by mouth daily.     Evolocumab (REPATHA SURECLICK) 373 MG/ML SOAJ Inject 1 pen. into the skin every 14 (fourteen) days. 2 mL 11   metFORMIN (GLUCOPHAGE-XR) 500 MG 24 hr tablet TAKE 2 TABLETS BY MOUTH EVERY DAY 60 tablet 0   Vitamin D, Ergocalciferol, (DRISDOL) 1.25 MG (50000 UNIT) CAPS capsule Take 1 capsule (50,000 Units total) by mouth every 30 (thirty) days. 3 capsule 0   No facility-administered medications prior to visit.    Allergies  Allergen Reactions   Doxycycline Shortness Of Breath   Gluten Meal Shortness Of Breath   Other Shortness Of Breath and Other (See Comments)    SQUASH   Tetracyclines & Related Shortness Of Breath and Other (See Comments)    Lethargy    Metformin And Related     Patient prefers to take Tevo brand metformin XR due to sensitivity rection to other brands. Patient is allergic to gluten and must have gluten free Metformin   Statins Rash    ROS     Objective:    Physical Exam Constitutional:      General: She is not in acute distress.    Appearance: She is not ill-appearing.  HENT:     Right Ear: Tympanic membrane and ear canal normal.     Left  Ear: Tympanic membrane and ear canal normal.     Nose: Nose normal.     Mouth/Throat:     Lips: Pink.     Mouth: Mucous membranes are moist. No oral lesions.  Dentition: Normal dentition. No gum lesions.     Tongue: No lesions.     Palate: No lesions.     Pharynx: Oropharynx is clear. No oropharyngeal exudate or posterior oropharyngeal erythema.  Eyes:     Extraocular Movements: Extraocular movements intact.     Conjunctiva/sclera: Conjunctivae normal.     Pupils: Pupils are equal, round, and reactive to light.  Cardiovascular:     Rate and Rhythm: Normal rate and regular rhythm.  Pulmonary:     Effort: Pulmonary effort is normal.     Breath sounds: Normal breath sounds.  Musculoskeletal:     Cervical back: Normal range of motion and neck supple. No tenderness.  Lymphadenopathy:     Cervical: No cervical adenopathy.  Neurological:     Mental Status: She is alert and oriented to person, place, and time.     Cranial Nerves: Cranial nerves 2-12 are intact.     Sensory: Sensation is intact.     Motor: Motor function is intact.     Coordination: Coordination is intact.  Psychiatric:        Mood and Affect: Mood normal.        Speech: Speech normal.        Behavior: Behavior normal.        Cognition and Memory: Cognition normal.     BP 120/76 (BP Location: Left Arm, Patient Position: Sitting, Cuff Size: Large)   Pulse (!) 110   Temp 97.8 F (36.6 C) (Temporal)   Ht '5\' 2"'$  (1.575 m)   Wt 182 lb (82.6 kg)   SpO2 98%   BMI 33.29 kg/m  Wt Readings from Last 3 Encounters:  08/20/21 182 lb (82.6 kg)  07/12/21 176 lb 6.4 oz (80 kg)  06/14/21 166 lb (75.3 kg)       Assessment & Plan:   Problem List Items Addressed This Visit       Digestive   History of oral lesions    Pictures of previous lesions on tongue and tonsil which have resolved. Normal exam today. Follow up if any new symptoms.         Immune and Lymphatic   Lymphadenopathy, cervical    Appears to have  resolved. She will follow up if any new or worsening symptoms.  Reassured patient and answered all of her questions to my best ability.  Reviewed recent negative Lyme testing with her on her phone.         Other   Altered taste - Primary    Becoming intermittent. No obvious etiology. She will continue to monitor.       Visit time 25 minutes in face to face communication with patient and coordination of care, additional 10 minutes spent in record review, coordination or care, ordering tests, communicating/referring to other healthcare professionals, documenting in medical records all on the same day of the visit for total time 35 minutes spent on the visit.    I am having Samantha Andersen maintain her acetaminophen, dexlansoprazole, aspirin EC, Repatha SureClick, metFORMIN, and Vitamin D (Ergocalciferol).  No orders of the defined types were placed in this encounter.

## 2021-08-20 NOTE — Assessment & Plan Note (Signed)
Becoming intermittent. No obvious etiology. She will continue to monitor.

## 2021-08-20 NOTE — Assessment & Plan Note (Signed)
Appears to have resolved. She will follow up if any new or worsening symptoms.  Reassured patient and answered all of her questions to my best ability.  Reviewed recent negative Lyme testing with her on her phone.

## 2021-08-20 NOTE — Patient Instructions (Signed)
Your exam today is normal.  If anything changes, new symptoms arise, please follow-up with [place].

## 2021-08-20 NOTE — Assessment & Plan Note (Signed)
Pictures of previous lesions on tongue and tonsil which have resolved. Normal exam today. Follow up if any new symptoms.

## 2021-08-26 DIAGNOSIS — M9906 Segmental and somatic dysfunction of lower extremity: Secondary | ICD-10-CM | POA: Diagnosis not present

## 2021-08-26 DIAGNOSIS — M2142 Flat foot [pes planus] (acquired), left foot: Secondary | ICD-10-CM | POA: Diagnosis not present

## 2021-08-26 DIAGNOSIS — M2141 Flat foot [pes planus] (acquired), right foot: Secondary | ICD-10-CM | POA: Diagnosis not present

## 2021-09-09 DIAGNOSIS — D225 Melanocytic nevi of trunk: Secondary | ICD-10-CM | POA: Diagnosis not present

## 2021-09-09 DIAGNOSIS — L738 Other specified follicular disorders: Secondary | ICD-10-CM | POA: Diagnosis not present

## 2021-09-09 DIAGNOSIS — L821 Other seborrheic keratosis: Secondary | ICD-10-CM | POA: Diagnosis not present

## 2021-09-09 DIAGNOSIS — L814 Other melanin hyperpigmentation: Secondary | ICD-10-CM | POA: Diagnosis not present

## 2021-09-13 ENCOUNTER — Other Ambulatory Visit (INDEPENDENT_AMBULATORY_CARE_PROVIDER_SITE_OTHER): Payer: Self-pay | Admitting: Family Medicine

## 2021-09-13 DIAGNOSIS — E559 Vitamin D deficiency, unspecified: Secondary | ICD-10-CM

## 2021-10-08 ENCOUNTER — Telehealth: Payer: Self-pay

## 2021-10-08 ENCOUNTER — Other Ambulatory Visit: Payer: Self-pay | Admitting: Internal Medicine

## 2021-10-08 MED ORDER — CIPROFLOXACIN HCL 500 MG PO TABS: 500.0000 mg | ORAL_TABLET | Freq: Two times a day (BID) | ORAL | 0 refills | Status: AC

## 2021-10-08 MED ORDER — DIAZEPAM 5 MG PO TABS: 5.0000 mg | ORAL_TABLET | Freq: Two times a day (BID) | ORAL | 0 refills | Status: DC | PRN

## 2021-10-08 NOTE — Telephone Encounter (Signed)
PLEASE ADVISE.

## 2021-10-08 NOTE — Telephone Encounter (Signed)
Patient was seen in March where she requested medication for traveling. Samantha Andersen goes out of the country on Sunday is needing these medications, cipro and is needing anxiety meds for the plane ride as well. Wondering if these can be called in. Asking if another physician can send this in as she is aware Dr.Crawford is out of the office.

## 2021-10-28 ENCOUNTER — Encounter: Payer: BC Managed Care – PPO | Admitting: Internal Medicine

## 2021-11-01 DIAGNOSIS — H43812 Vitreous degeneration, left eye: Secondary | ICD-10-CM | POA: Diagnosis not present

## 2021-11-04 ENCOUNTER — Encounter: Payer: Self-pay | Admitting: Internal Medicine

## 2021-11-04 ENCOUNTER — Ambulatory Visit (INDEPENDENT_AMBULATORY_CARE_PROVIDER_SITE_OTHER): Payer: BC Managed Care – PPO | Admitting: Internal Medicine

## 2021-11-04 VITALS — BP 118/60 | HR 96 | Temp 97.2°F | Ht 62.0 in | Wt 186.0 lb

## 2021-11-04 DIAGNOSIS — D509 Iron deficiency anemia, unspecified: Secondary | ICD-10-CM | POA: Diagnosis not present

## 2021-11-04 DIAGNOSIS — Z0000 Encounter for general adult medical examination without abnormal findings: Secondary | ICD-10-CM

## 2021-11-04 DIAGNOSIS — T466X5A Adverse effect of antihyperlipidemic and antiarteriosclerotic drugs, initial encounter: Secondary | ICD-10-CM

## 2021-11-04 DIAGNOSIS — M791 Myalgia, unspecified site: Secondary | ICD-10-CM

## 2021-11-04 DIAGNOSIS — E782 Mixed hyperlipidemia: Secondary | ICD-10-CM

## 2021-11-04 DIAGNOSIS — R7303 Prediabetes: Secondary | ICD-10-CM

## 2021-11-04 LAB — CBC
HCT: 41.4 % (ref 36.0–46.0)
Hemoglobin: 13.4 g/dL (ref 12.0–15.0)
MCHC: 32.4 g/dL (ref 30.0–36.0)
MCV: 90.9 fl (ref 78.0–100.0)
Platelets: 352 10*3/uL (ref 150.0–400.0)
RBC: 4.56 Mil/uL (ref 3.87–5.11)
RDW: 14.2 % (ref 11.5–15.5)
WBC: 8 10*3/uL (ref 4.0–10.5)

## 2021-11-04 LAB — COMPREHENSIVE METABOLIC PANEL
ALT: 24 U/L (ref 0–35)
AST: 24 U/L (ref 0–37)
Albumin: 4.2 g/dL (ref 3.5–5.2)
Alkaline Phosphatase: 56 U/L (ref 39–117)
BUN: 15 mg/dL (ref 6–23)
CO2: 24 mEq/L (ref 19–32)
Calcium: 9.7 mg/dL (ref 8.4–10.5)
Chloride: 103 mEq/L (ref 96–112)
Creatinine, Ser: 0.68 mg/dL (ref 0.40–1.20)
GFR: 95.95 mL/min (ref >60.00)
Glucose, Bld: 130 mg/dL — ABNORMAL HIGH (ref 70–99)
Potassium: 3.8 mEq/L (ref 3.5–5.1)
Sodium: 137 mEq/L (ref 135–145)
Total Bilirubin: 0.3 mg/dL (ref 0.2–1.2)
Total Protein: 7.4 g/dL (ref 6.0–8.3)

## 2021-11-04 LAB — HEMOGLOBIN A1C: Hgb A1c MFr Bld: 6.4 % (ref 4.6–6.5)

## 2021-11-04 NOTE — Progress Notes (Signed)
   Subjective:   Patient ID: Samantha Andersen, female    DOB: [birth year], 56 y.o.   MRN: [id number]  HPI The patient is here for physical.  PMH, [facility], social history reviewed and updated  Review of Systems  Constitutional: Negative.   HENT: Negative.    Eyes: Negative.   Respiratory:  Negative for cough, chest tightness and shortness of breath.   Cardiovascular:  Negative for chest pain, palpitations and leg swelling.  Gastrointestinal:  Negative for abdominal distention, abdominal pain, constipation, diarrhea, nausea and vomiting.  Musculoskeletal: Negative.   Skin: Negative.   Neurological: Negative.   Psychiatric/Behavioral: Negative.      Objective:  Physical Exam Constitutional:      Appearance: She is well-developed.  HENT:     Head: Normocephalic and atraumatic.  Cardiovascular:     Rate and Rhythm: Normal rate and regular rhythm.  Pulmonary:     Effort: Pulmonary effort is normal. No respiratory distress.     Breath sounds: Normal breath sounds. No wheezing or rales.  Abdominal:     General: Bowel sounds are normal. There is no distension.     Palpations: Abdomen is soft.     Tenderness: There is no abdominal tenderness. There is no rebound.  Musculoskeletal:     Cervical back: Normal range of motion.  Skin:    General: Skin is warm and dry.  Neurological:     Mental Status: She is alert and oriented to person, place, and time.     Coordination: Coordination normal.     Vitals:   11/04/21 0934  BP: 118/60  Pulse: 96  Temp: (!) 97.2 F (36.2 C)  TempSrc: Oral  SpO2: 97%  Weight: 186 lb (84.4 kg)  Height: '5\' 2"'$  (1.575 m)    Assessment & Plan:

## 2021-11-05 DIAGNOSIS — Z0000 Encounter for general adult medical examination without abnormal findings: Secondary | ICD-10-CM | POA: Insufficient documentation

## 2021-11-05 NOTE — Assessment & Plan Note (Signed)
Taking repatha but stopped. Advised to resume.

## 2021-11-05 NOTE — Assessment & Plan Note (Signed)
Checking HgA1c and counseled about diet.

## 2021-11-05 NOTE — Assessment & Plan Note (Signed)
She stopped repatha about 1 month ago due to travel and feeling like it was impacting her sugars. Checking HgA1c. Advised to resume as it helped her cholesterol significantly.

## 2021-11-05 NOTE — Assessment & Plan Note (Signed)
Checking CBC for stability adjust as needed.

## 2021-11-05 NOTE — Assessment & Plan Note (Signed)
Flu shot declines. Covid-19 counseled. Shingrix declines. Tetanus up to date. Colonoscopy counseled. Mammogram counseled, pap smear counseled. Counseled about sun safety and mole surveillance. Counseled about the dangers of distracted driving. Given 10 year screening recommendations.

## 2021-11-08 ENCOUNTER — Other Ambulatory Visit: Payer: Self-pay | Admitting: Internal Medicine

## 2021-11-08 DIAGNOSIS — R7303 Prediabetes: Secondary | ICD-10-CM

## 2021-11-08 DIAGNOSIS — F5089 Other specified eating disorder: Secondary | ICD-10-CM

## 2021-11-29 DIAGNOSIS — H43812 Vitreous degeneration, left eye: Secondary | ICD-10-CM | POA: Diagnosis not present

## 2021-12-22 DIAGNOSIS — M47899 Other spondylosis, site unspecified: Secondary | ICD-10-CM | POA: Diagnosis not present

## 2021-12-22 DIAGNOSIS — M1991 Primary osteoarthritis, unspecified site: Secondary | ICD-10-CM | POA: Diagnosis not present

## 2021-12-22 DIAGNOSIS — R768 Other specified abnormal immunological findings in serum: Secondary | ICD-10-CM | POA: Diagnosis not present

## 2022-01-13 DIAGNOSIS — H43812 Vitreous degeneration, left eye: Secondary | ICD-10-CM | POA: Diagnosis not present

## 2022-01-20 DIAGNOSIS — Z13820 Encounter for screening for osteoporosis: Secondary | ICD-10-CM | POA: Diagnosis not present

## 2022-01-25 DIAGNOSIS — R635 Abnormal weight gain: Secondary | ICD-10-CM | POA: Diagnosis not present

## 2022-01-25 DIAGNOSIS — K219 Gastro-esophageal reflux disease without esophagitis: Secondary | ICD-10-CM | POA: Diagnosis not present

## 2022-01-25 DIAGNOSIS — K449 Diaphragmatic hernia without obstruction or gangrene: Secondary | ICD-10-CM | POA: Diagnosis not present

## 2022-02-10 DIAGNOSIS — M8580 Other specified disorders of bone density and structure, unspecified site: Secondary | ICD-10-CM | POA: Diagnosis not present

## 2022-02-10 DIAGNOSIS — Z01419 Encounter for gynecological examination (general) (routine) without abnormal findings: Secondary | ICD-10-CM | POA: Diagnosis not present

## 2022-02-10 DIAGNOSIS — Z1272 Encounter for screening for malignant neoplasm of vagina: Secondary | ICD-10-CM | POA: Diagnosis not present

## 2022-02-10 DIAGNOSIS — Z6834 Body mass index (BMI) 34.0-34.9, adult: Secondary | ICD-10-CM | POA: Diagnosis not present

## 2022-03-03 DIAGNOSIS — H43812 Vitreous degeneration, left eye: Secondary | ICD-10-CM | POA: Diagnosis not present

## 2022-03-03 DIAGNOSIS — H2513 Age-related nuclear cataract, bilateral: Secondary | ICD-10-CM | POA: Diagnosis not present

## 2022-03-03 DIAGNOSIS — H35372 Puckering of macula, left eye: Secondary | ICD-10-CM | POA: Diagnosis not present

## 2022-03-03 DIAGNOSIS — H43392 Other vitreous opacities, left eye: Secondary | ICD-10-CM | POA: Diagnosis not present

## 2022-03-03 DIAGNOSIS — H43813 Vitreous degeneration, bilateral: Secondary | ICD-10-CM | POA: Diagnosis not present

## 2022-03-08 DIAGNOSIS — E7800 Pure hypercholesterolemia, unspecified: Secondary | ICD-10-CM | POA: Diagnosis not present

## 2022-03-08 DIAGNOSIS — E559 Vitamin D deficiency, unspecified: Secondary | ICD-10-CM | POA: Diagnosis not present

## 2022-03-08 DIAGNOSIS — R7301 Impaired fasting glucose: Secondary | ICD-10-CM | POA: Diagnosis not present

## 2022-03-08 DIAGNOSIS — E049 Nontoxic goiter, unspecified: Secondary | ICD-10-CM | POA: Diagnosis not present

## 2022-03-08 DIAGNOSIS — E538 Deficiency of other specified B group vitamins: Secondary | ICD-10-CM | POA: Diagnosis not present

## 2022-03-16 ENCOUNTER — Other Ambulatory Visit: Payer: Self-pay | Admitting: Endocrinology

## 2022-03-16 DIAGNOSIS — E049 Nontoxic goiter, unspecified: Secondary | ICD-10-CM | POA: Diagnosis not present

## 2022-03-16 DIAGNOSIS — M9906 Segmental and somatic dysfunction of lower extremity: Secondary | ICD-10-CM | POA: Diagnosis not present

## 2022-03-16 DIAGNOSIS — M2141 Flat foot [pes planus] (acquired), right foot: Secondary | ICD-10-CM | POA: Diagnosis not present

## 2022-03-16 DIAGNOSIS — E559 Vitamin D deficiency, unspecified: Secondary | ICD-10-CM | POA: Diagnosis not present

## 2022-03-16 DIAGNOSIS — M2142 Flat foot [pes planus] (acquired), left foot: Secondary | ICD-10-CM | POA: Diagnosis not present

## 2022-03-16 DIAGNOSIS — E282 Polycystic ovarian syndrome: Secondary | ICD-10-CM | POA: Diagnosis not present

## 2022-03-16 DIAGNOSIS — M9903 Segmental and somatic dysfunction of lumbar region: Secondary | ICD-10-CM | POA: Diagnosis not present

## 2022-03-16 DIAGNOSIS — R7301 Impaired fasting glucose: Secondary | ICD-10-CM | POA: Diagnosis not present

## 2022-03-17 DIAGNOSIS — M791 Myalgia, unspecified site: Secondary | ICD-10-CM | POA: Diagnosis not present

## 2022-03-17 DIAGNOSIS — E7800 Pure hypercholesterolemia, unspecified: Secondary | ICD-10-CM | POA: Diagnosis not present

## 2022-03-17 DIAGNOSIS — Z789 Other specified health status: Secondary | ICD-10-CM | POA: Diagnosis not present

## 2022-03-17 DIAGNOSIS — I2510 Atherosclerotic heart disease of native coronary artery without angina pectoris: Secondary | ICD-10-CM | POA: Diagnosis not present

## 2022-03-22 ENCOUNTER — Other Ambulatory Visit: Payer: Self-pay

## 2022-03-25 ENCOUNTER — Telehealth: Payer: Self-pay | Admitting: Pharmacy Technician

## 2022-03-25 NOTE — Telephone Encounter (Addendum)
 Auth Submission: APPROVED Site of care: Site of care: [facility] Payer: BCBS Medication & CPT/J Code(s) submitted: Leqvio  (Inclisiran) [id number] Route of submission (phone, fax, portal):  Phone # Fax # Auth type: Buy/Bill Units/visits requested: 3 Reference number: [id number] Approval from: 03/23/22 to 03/23/23   RENEWAL: ID: [id number] 08/03/23 - 08/02/24   LEQVIO  CO-PAY CARD: APPROVED ID: [id number] PHONE: [phone]

## 2022-04-04 ENCOUNTER — Other Ambulatory Visit: Payer: Self-pay

## 2022-04-07 ENCOUNTER — Ambulatory Visit (INDEPENDENT_AMBULATORY_CARE_PROVIDER_SITE_OTHER): Payer: BC Managed Care – PPO | Admitting: *Deleted

## 2022-04-07 VITALS — BP 114/71 | HR 66 | Temp 98.0°F | Resp 16 | Ht 62.0 in | Wt 167.8 lb

## 2022-04-07 DIAGNOSIS — E782 Mixed hyperlipidemia: Secondary | ICD-10-CM | POA: Diagnosis not present

## 2022-04-07 DIAGNOSIS — M791 Myalgia, unspecified site: Secondary | ICD-10-CM | POA: Diagnosis not present

## 2022-04-07 DIAGNOSIS — T466X5A Adverse effect of antihyperlipidemic and antiarteriosclerotic drugs, initial encounter: Secondary | ICD-10-CM | POA: Diagnosis not present

## 2022-04-07 MED ORDER — INCLISIRAN SODIUM 284 MG/1.5ML ~~LOC~~ SOSY
284.0000 mg | PREFILLED_SYRINGE | Freq: Once | SUBCUTANEOUS | Status: AC
  Administered 2022-04-07: 284 mg via SUBCUTANEOUS
  Filled 2022-04-07: qty 1.5

## 2022-04-07 NOTE — Progress Notes (Addendum)
Diagnosis: Hyperlipidemia  Provider:  Praveen Mannam MD  Procedure: Injection  Leqvio (inclisiran), Dose: 284 mg, Site: subcutaneous, Number of injections: 1  Post Care: Observation period completed  Discharge: Condition: Good, Destination: Home . AVS Provided  Performed by:  Keasha Malkiewicz A, RN       

## 2022-04-18 DIAGNOSIS — Z9011 Acquired absence of right breast and nipple: Secondary | ICD-10-CM | POA: Diagnosis not present

## 2022-04-18 DIAGNOSIS — C50912 Malignant neoplasm of unspecified site of left female breast: Secondary | ICD-10-CM | POA: Diagnosis not present

## 2022-05-24 ENCOUNTER — Encounter: Payer: Self-pay | Admitting: Adult Health

## 2022-05-24 ENCOUNTER — Other Ambulatory Visit (HOSPITAL_COMMUNITY): Payer: Self-pay

## 2022-05-24 ENCOUNTER — Encounter: Payer: Self-pay | Admitting: Endocrinology

## 2022-06-08 ENCOUNTER — Encounter: Payer: Self-pay | Admitting: Internal Medicine

## 2022-06-08 ENCOUNTER — Ambulatory Visit: Payer: BC Managed Care – PPO | Admitting: Internal Medicine

## 2022-06-08 VITALS — BP 112/70 | HR 56 | Ht 62.0 in | Wt 159.0 lb

## 2022-06-08 DIAGNOSIS — Z7184 Encounter for health counseling related to travel: Secondary | ICD-10-CM

## 2022-06-08 DIAGNOSIS — E782 Mixed hyperlipidemia: Secondary | ICD-10-CM

## 2022-06-08 DIAGNOSIS — Z23 Encounter for immunization: Secondary | ICD-10-CM

## 2022-06-08 DIAGNOSIS — M791 Myalgia, unspecified site: Secondary | ICD-10-CM

## 2022-06-08 DIAGNOSIS — R109 Unspecified abdominal pain: Secondary | ICD-10-CM | POA: Diagnosis not present

## 2022-06-08 DIAGNOSIS — R7303 Prediabetes: Secondary | ICD-10-CM

## 2022-06-08 DIAGNOSIS — T466X5A Adverse effect of antihyperlipidemic and antiarteriosclerotic drugs, initial encounter: Secondary | ICD-10-CM

## 2022-06-08 LAB — COMPREHENSIVE METABOLIC PANEL
ALT: 15 U/L (ref 0–35)
AST: 16 U/L (ref 0–37)
Albumin: 4.2 g/dL (ref 3.5–5.2)
Alkaline Phosphatase: 48 U/L (ref 39–117)
BUN: 15 mg/dL (ref 6–23)
CO2: 29 mEq/L (ref 19–32)
Calcium: 9.5 mg/dL (ref 8.4–10.5)
Chloride: 104 mEq/L (ref 96–112)
Creatinine, Ser: 0.66 mg/dL (ref 0.40–1.20)
GFR: 96.25 mL/min (ref >60.00)
Glucose, Bld: 88 mg/dL (ref 70–99)
Potassium: 4 mEq/L (ref 3.5–5.1)
Sodium: 140 mEq/L (ref 135–145)
Total Bilirubin: 0.4 mg/dL (ref 0.2–1.2)
Total Protein: 7.2 g/dL (ref 6.0–8.3)

## 2022-06-08 LAB — CBC
HCT: 40.8 % (ref 36.0–46.0)
Hemoglobin: 13.6 g/dL (ref 12.0–15.0)
MCHC: 33.2 g/dL (ref 30.0–36.0)
MCV: 93.3 fl (ref 78.0–100.0)
Platelets: 255 10*3/uL (ref 150.0–400.0)
RBC: 4.38 Mil/uL (ref 3.87–5.11)
RDW: 14.4 % (ref 11.5–15.5)
WBC: 6 10*3/uL (ref 4.0–10.5)

## 2022-06-08 LAB — LIPID PANEL
Cholesterol: 185 mg/dL (ref 0–200)
HDL: 54.3 mg/dL (ref >39.00)
LDL Cholesterol: 113 mg/dL — ABNORMAL HIGH (ref 0–99)
NonHDL: 130.36
Total CHOL/HDL Ratio: 3
Triglycerides: 89 mg/dL (ref 0.0–149.0)
VLDL: 17.8 mg/dL (ref 0.0–40.0)

## 2022-06-08 LAB — HEMOGLOBIN A1C: Hgb A1c MFr Bld: 6 % (ref 4.6–6.5)

## 2022-06-08 MED ORDER — VIVOTIF PO CPDR: 1.0000 | DELAYED_RELEASE_CAPSULE | ORAL | 0 refills | Status: DC

## 2022-06-08 MED ORDER — DIAZEPAM 5 MG PO TABS: 5.0000 mg | ORAL_TABLET | Freq: Two times a day (BID) | ORAL | 0 refills | Status: DC | PRN

## 2022-06-08 MED ORDER — CIPROFLOXACIN HCL 500 MG PO TABS: 500.0000 mg | ORAL_TABLET | Freq: Two times a day (BID) | ORAL | 0 refills | Status: AC

## 2022-06-08 NOTE — Patient Instructions (Addendum)
We have sent in the typhoid vaccine to take every other day until gone. Plan to finish the vaccine 2 weeks before travel.  Let [place] know if the stomach pain comes back and we can get a CT scan.  We have sent in the ciprofloxacin to take 1 pill twice a day for 5 days in case of diarrhea illness or uti.   We have sent in the refill of the anxiety pills for you for travel.

## 2022-06-08 NOTE — Progress Notes (Signed)
   Subjective:   Patient ID: Samantha Andersen, female    DOB: [birth year], 57 y.o.   MRN: [id number]  HPI The patient is a 57 YO female coming in for right side pain. And needing travel advice.  Review of Systems  Constitutional: Negative.   HENT: Negative.    Eyes: Negative.   Respiratory:  Negative for cough, chest tightness and shortness of breath.   Cardiovascular:  Negative for chest pain, palpitations and leg swelling.  Gastrointestinal:  Positive for abdominal pain. Negative for abdominal distention, constipation, diarrhea, nausea and vomiting.  Musculoskeletal: Negative.   Skin: Negative.   Neurological: Negative.   Psychiatric/Behavioral: Negative.      Objective:  Physical Exam Constitutional:      Appearance: She is well-developed.  HENT:     Head: Normocephalic and atraumatic.  Cardiovascular:     Rate and Rhythm: Normal rate and regular rhythm.  Pulmonary:     Effort: Pulmonary effort is normal. No respiratory distress.     Breath sounds: Normal breath sounds. No wheezing or rales.  Abdominal:     General: Bowel sounds are normal. There is no distension.     Palpations: Abdomen is soft.     Tenderness: There is no abdominal tenderness. There is no rebound.  Musculoskeletal:     Cervical back: Normal range of motion.  Skin:    General: Skin is warm and dry.  Neurological:     Mental Status: She is alert and oriented to person, place, and time.     Coordination: Coordination normal.     Vitals:   06/08/22 0805  BP: 112/70  Pulse: (!) 56  SpO2: 95%  Weight: 159 lb (72.1 kg)  Height: 5\' 2"  (1.575 m)   Visit time 25 minutes in face to face communication with patient and coordination of care, additional 10 minutes spent in record review, coordination or care, ordering tests, communicating/referring to other healthcare professionals, documenting in medical records all on the same day of the visit for total time 35 minutes spent on the visit.   Assessment &  Plan:  Hepatitis A given at visit

## 2022-06-10 ENCOUNTER — Encounter: Payer: Self-pay | Admitting: Internal Medicine

## 2022-06-10 DIAGNOSIS — R109 Unspecified abdominal pain: Secondary | ICD-10-CM | POA: Insufficient documentation

## 2022-06-10 NOTE — Assessment & Plan Note (Signed)
Going on for years off and on. She is not having pain today. We discussed prior workup of her gallbladder mostly with [place] and HIDA scan. She has not had CT abdomen and pelvis and this would be the next step. Checking CBC and CMP today. She will call us if pain returns.

## 2022-06-10 NOTE — Assessment & Plan Note (Signed)
Checking lipid panel and adjust as needed.  

## 2022-06-10 NOTE — Assessment & Plan Note (Signed)
Checking HgA1c today. Adjust as needed.  °

## 2022-06-10 NOTE — Assessment & Plan Note (Signed)
Traveling to [place], [place]. Recommendation for hep a (she is unsure if she has had) given 1st today reminded 2nd in 6 months, typhoid (oral rx prescribed today) and rx cipro for travel. Encouraged to take all medications with her. Given benzo for travel (flight). Counseled about safety and given letter to take suture kit with her (family member can do sutures).

## 2022-06-10 NOTE — Assessment & Plan Note (Signed)
Unable to tolerate statins. Checking lipid panel today.

## 2022-06-30 DIAGNOSIS — H43392 Other vitreous opacities, left eye: Secondary | ICD-10-CM | POA: Diagnosis not present

## 2022-08-08 ENCOUNTER — Ambulatory Visit: Admission: RE | Admit: 2022-08-08 | Payer: BC Managed Care – PPO | Source: Ambulatory Visit

## 2022-08-08 DIAGNOSIS — E049 Nontoxic goiter, unspecified: Secondary | ICD-10-CM

## 2022-08-11 ENCOUNTER — Ambulatory Visit (INDEPENDENT_AMBULATORY_CARE_PROVIDER_SITE_OTHER): Payer: BC Managed Care – PPO | Admitting: *Deleted

## 2022-08-11 VITALS — BP 114/72 | HR 101 | Temp 99.2°F | Resp 17 | Ht 62.0 in | Wt 163.0 lb

## 2022-08-11 DIAGNOSIS — M791 Myalgia, unspecified site: Secondary | ICD-10-CM

## 2022-08-11 DIAGNOSIS — E7800 Pure hypercholesterolemia, unspecified: Secondary | ICD-10-CM | POA: Diagnosis not present

## 2022-08-11 DIAGNOSIS — E559 Vitamin D deficiency, unspecified: Secondary | ICD-10-CM | POA: Diagnosis not present

## 2022-08-11 DIAGNOSIS — R432 Parageusia: Secondary | ICD-10-CM | POA: Diagnosis not present

## 2022-08-11 DIAGNOSIS — E782 Mixed hyperlipidemia: Secondary | ICD-10-CM

## 2022-08-11 DIAGNOSIS — R7309 Other abnormal glucose: Secondary | ICD-10-CM | POA: Diagnosis not present

## 2022-08-11 MED ORDER — INCLISIRAN SODIUM 284 MG/1.5ML ~~LOC~~ SOSY
284.0000 mg | PREFILLED_SYRINGE | Freq: Once | SUBCUTANEOUS | Status: AC
  Administered 2022-08-11: 284 mg via SUBCUTANEOUS
  Filled 2022-08-11: qty 1.5

## 2022-08-11 NOTE — Patient Instructions (Signed)
Inclisiran Injection What is this medication? INCLISIRAN (in kli SIR an) treats high cholesterol. It works by decreasing bad cholesterol (such as LDL) in your blood. Changes to diet and exercise are often combined with this medication. This medicine may be used for other purposes; ask your health care provider or pharmacist if you have questions. COMMON BRAND NAME(S): LEQVIO What should I tell my care team before I take this medication? They need to know if you have any of these conditions: An unusual or allergic reaction to inclisiran, other medications, foods, dyes, or preservatives Pregnant or trying to get pregnant Breast-feeding How should I use this medication? This medication is injected under the skin. It is given by your care team in a hospital or clinic setting. Talk to your care team about the use of this medication in children. Special care may be needed. Overdosage: If you think you have taken too much of this medicine contact a poison control center or emergency room at once. NOTE: This medicine is only for you. Do not share this medicine with others. What if I miss a dose? Keep appointments for follow-up doses. It is important not to miss your dose. Call your care team if you are unable to keep an appointment. What may interact with this medication? Interactions are not expected. This list may not describe all possible interactions. Give your health care provider a list of all the medicines, herbs, non-prescription drugs, or dietary supplements you use. Also tell them if you smoke, drink alcohol, or use illegal drugs. Some items may interact with your medicine. What should I watch for while using this medication? Visit your care team for regular checks on your progress. Tell your care team if your symptoms do not start to get better or if they get worse. You may need blood work while you are taking this medication. What side effects may I notice from receiving this  medication? Side effects that you should report to your care team as soon as possible: Allergic reactions--skin rash, itching, hives, swelling of the face, lips, tongue, or throat Side effects that usually do not require medical attention (report these to your care team if they continue or are bothersome): Joint pain Pain, redness, or irritation at injection site This list may not describe all possible side effects. Call your doctor for medical advice about side effects. You may report side effects to FDA at 1-800-FDA-1088. Where should I keep my medication? This medication is given in a hospital or clinic. It will not be stored at home. NOTE: This sheet is a summary. It may not cover all possible information. If you have questions about this medicine, talk to your doctor, pharmacist, or health care provider.  2024 Elsevier/Gold Standard (2021-07-23 00:00:00)

## 2022-08-11 NOTE — Progress Notes (Signed)
Diagnosis: Hyperlipidemia  Provider:  Mannam, Praveen MD  Procedure: Injection  Leqvio (inclisiran), Dose: 284 mg, Site: subcutaneous, Number of injections: 1  Post Care: Observation period completed  Discharge: Condition: Good, Destination: Home . AVS Provided  Performed by:  Williams, Kathryn A, RN       

## 2022-08-18 DIAGNOSIS — K900 Celiac disease: Secondary | ICD-10-CM | POA: Diagnosis not present

## 2022-08-18 DIAGNOSIS — E049 Nontoxic goiter, unspecified: Secondary | ICD-10-CM | POA: Diagnosis not present

## 2022-08-18 DIAGNOSIS — E559 Vitamin D deficiency, unspecified: Secondary | ICD-10-CM | POA: Diagnosis not present

## 2022-08-18 DIAGNOSIS — R7301 Impaired fasting glucose: Secondary | ICD-10-CM | POA: Diagnosis not present

## 2022-09-14 DIAGNOSIS — Z853 Personal history of malignant neoplasm of breast: Secondary | ICD-10-CM | POA: Diagnosis not present

## 2022-09-20 DIAGNOSIS — D225 Melanocytic nevi of trunk: Secondary | ICD-10-CM | POA: Diagnosis not present

## 2022-09-20 DIAGNOSIS — Z08 Encounter for follow-up examination after completed treatment for malignant neoplasm: Secondary | ICD-10-CM | POA: Diagnosis not present

## 2022-09-20 DIAGNOSIS — L814 Other melanin hyperpigmentation: Secondary | ICD-10-CM | POA: Diagnosis not present

## 2022-09-20 DIAGNOSIS — L821 Other seborrheic keratosis: Secondary | ICD-10-CM | POA: Diagnosis not present

## 2022-10-01 LAB — AMB RESULTS CONSOLE CBG: Glucose: 99

## 2022-10-03 DIAGNOSIS — E669 Obesity, unspecified: Secondary | ICD-10-CM | POA: Diagnosis not present

## 2022-10-03 DIAGNOSIS — Z0489 Encounter for examination and observation for other specified reasons: Secondary | ICD-10-CM | POA: Diagnosis not present

## 2022-10-03 DIAGNOSIS — Z1501 Genetic susceptibility to malignant neoplasm of breast: Secondary | ICD-10-CM | POA: Diagnosis not present

## 2022-10-03 DIAGNOSIS — Z6831 Body mass index (BMI) 31.0-31.9, adult: Secondary | ICD-10-CM | POA: Diagnosis not present

## 2022-10-03 DIAGNOSIS — Z1589 Genetic susceptibility to other disease: Secondary | ICD-10-CM | POA: Diagnosis not present

## 2022-10-04 DIAGNOSIS — N83201 Unspecified ovarian cyst, right side: Secondary | ICD-10-CM | POA: Diagnosis not present

## 2022-10-04 DIAGNOSIS — Z1589 Genetic susceptibility to other disease: Secondary | ICD-10-CM | POA: Diagnosis not present

## 2022-10-04 DIAGNOSIS — Z1501 Genetic susceptibility to malignant neoplasm of breast: Secondary | ICD-10-CM | POA: Diagnosis not present

## 2022-11-07 ENCOUNTER — Encounter: Payer: BC Managed Care – PPO | Admitting: Internal Medicine

## 2022-12-19 DIAGNOSIS — I517 Cardiomegaly: Secondary | ICD-10-CM | POA: Diagnosis not present

## 2022-12-19 DIAGNOSIS — Z6833 Body mass index (BMI) 33.0-33.9, adult: Secondary | ICD-10-CM | POA: Diagnosis not present

## 2022-12-19 DIAGNOSIS — E669 Obesity, unspecified: Secondary | ICD-10-CM | POA: Diagnosis not present

## 2022-12-19 DIAGNOSIS — Z01818 Encounter for other preprocedural examination: Secondary | ICD-10-CM | POA: Diagnosis not present

## 2022-12-19 DIAGNOSIS — Z1501 Genetic susceptibility to malignant neoplasm of breast: Secondary | ICD-10-CM | POA: Diagnosis not present

## 2022-12-19 DIAGNOSIS — Z1502 Genetic susceptibility to malignant neoplasm of ovary: Secondary | ICD-10-CM | POA: Diagnosis not present

## 2022-12-19 DIAGNOSIS — Z1589 Genetic susceptibility to other disease: Secondary | ICD-10-CM | POA: Diagnosis not present

## 2022-12-22 DIAGNOSIS — M47899 Other spondylosis, site unspecified: Secondary | ICD-10-CM | POA: Diagnosis not present

## 2022-12-22 DIAGNOSIS — K900 Celiac disease: Secondary | ICD-10-CM | POA: Diagnosis not present

## 2022-12-22 DIAGNOSIS — R768 Other specified abnormal immunological findings in serum: Secondary | ICD-10-CM | POA: Diagnosis not present

## 2022-12-27 DIAGNOSIS — N736 Female pelvic peritoneal adhesions (postinfective): Secondary | ICD-10-CM | POA: Diagnosis not present

## 2022-12-27 DIAGNOSIS — R9431 Abnormal electrocardiogram [ECG] [EKG]: Secondary | ICD-10-CM | POA: Diagnosis not present

## 2022-12-27 DIAGNOSIS — Z9013 Acquired absence of bilateral breasts and nipples: Secondary | ICD-10-CM | POA: Diagnosis not present

## 2022-12-27 DIAGNOSIS — E785 Hyperlipidemia, unspecified: Secondary | ICD-10-CM | POA: Diagnosis not present

## 2022-12-27 DIAGNOSIS — Z79899 Other long term (current) drug therapy: Secondary | ICD-10-CM | POA: Diagnosis not present

## 2022-12-27 DIAGNOSIS — R5383 Other fatigue: Secondary | ICD-10-CM | POA: Diagnosis not present

## 2022-12-27 DIAGNOSIS — Z853 Personal history of malignant neoplasm of breast: Secondary | ICD-10-CM | POA: Diagnosis not present

## 2022-12-27 DIAGNOSIS — R109 Unspecified abdominal pain: Secondary | ICD-10-CM | POA: Diagnosis not present

## 2022-12-27 DIAGNOSIS — R0609 Other forms of dyspnea: Secondary | ICD-10-CM | POA: Diagnosis not present

## 2022-12-27 DIAGNOSIS — R0789 Other chest pain: Secondary | ICD-10-CM | POA: Diagnosis not present

## 2022-12-27 DIAGNOSIS — Z01810 Encounter for preprocedural cardiovascular examination: Secondary | ICD-10-CM | POA: Diagnosis not present

## 2022-12-28 DIAGNOSIS — R0609 Other forms of dyspnea: Secondary | ICD-10-CM | POA: Diagnosis not present

## 2022-12-28 DIAGNOSIS — R0789 Other chest pain: Secondary | ICD-10-CM | POA: Diagnosis not present

## 2023-01-06 ENCOUNTER — Telehealth: Payer: Self-pay | Admitting: Cardiovascular Disease

## 2023-01-06 NOTE — Telephone Encounter (Signed)
Pt c/o swelling/edema: STAT if pt has developed SOB within 24 hours  If swelling, where is the swelling located?   Hands and feet  How much weight have you gained and in what time span?   Unknown  Have you gained 2 pounds in a day or 5 pounds in a week?   Unknown  Do you have a log of your daily weights (if so, list)?   No  Are you currently taking a fluid pill?   No  Are you currently SOB?  Yes  Have you traveled recently in a car or plane for an extended period of time?  Patient dropped off daughter in [place]  Patient stated she just noticed the swelling today.  Patient stated she has also been having dizziness and her energy level has dropped.

## 2023-01-06 NOTE — Telephone Encounter (Signed)
Called and spoke to patient. Verified name and DOB. Patient report she woke up with swelling in her hands and feet. She is also having dizziness and SOB. She is leaving to go out of town on Sunday to [place]. No appointments available in the the office. She is unsure if she has had any weight gain and her BP is 103/70. Advised patient to go the urgent care or the ED for evaluation. Patient verbalized understanding and agree.

## 2023-01-08 NOTE — Progress Notes (Deleted)
°  Cardiology Office Note:  .   Date:  01/08/2023  ID:  Samantha Andersen, DOB [birth year], MRN [id number] PCP: Myrlene Broker, MD  [facility] Providers Cardiologist:  Meriam Sprague, MD (Inactive) { Click to update primary MD,subspecialty MD or APP then REFRESH:1}   History of Present Illness: .   Samantha Andersen is a 57 y.o. female with history of sinus tachycardia and non-obstructive CAD who presents for follow-up.      Problem List Sinus tachycardia Non-obstructive CAD -mild 25-49% pRCA -CAC 10 (78th percentile) 3. HLD -T chol 185, HDL 54, LDL 113, TG 89    ROS: All other ROS reviewed and negative. Pertinent positives noted in the HPI.     Studies Reviewed: Marland Kitchen        TTE 04/16/2021  1. Left ventricular ejection fraction, by estimation, is 60 to 65%. The  left ventricle has normal function. The left ventricle has no regional  wall motion abnormalities. Left ventricular diastolic parameters were  normal.   2. Right ventricular systolic function is normal. The right ventricular  size is normal.   3. Left atrial size was mildly dilated.   4. The mitral valve is normal in structure. Trivial mitral valve  regurgitation. No evidence of mitral stenosis.   5. The aortic valve is normal in structure. Aortic valve regurgitation is  not visualized. Aortic valve sclerosis/calcification is present, without  any evidence of aortic stenosis.   6. The inferior vena cava is normal in size with greater than 50%  respiratory variability, suggesting right atrial pressure of 3 mmHg.  Physical Exam:   VS:  There were no vitals taken for this visit.   Wt Readings from Last 3 Encounters:  08/11/22 163 lb (73.9 kg)  06/08/22 159 lb (72.1 kg)  04/07/22 167 lb 12.8 oz (76.1 kg)    GEN: Well nourished, well developed in no acute distress NECK: No JVD; No carotid bruits CARDIAC: ***RRR, no murmurs, rubs, gallops RESPIRATORY:  Clear to auscultation without rales, wheezing or  rhonchi  ABDOMEN: Soft, non-tender, non-distended EXTREMITIES:  No edema; No deformity  ASSESSMENT AND PLAN: .   ***    {Are you ordering a CV Procedure (e.g. stress test, cath, DCCV, TEE, etc)?   Press F2        :[id number]}   Follow-up: No follow-ups on file.  Time Spent with Patient: I have spent a total of *** minutes caring for this patient today face to face, ordering and reviewing labs/tests, reviewing prior records/medical history, examining the patient, establishing an assessment and plan, communicating results/findings to the patient/family, and documenting in the medical record.   Signed, Lenna Gilford. Flora Lipps, MD, [facility]  [facility]  [address], [address] [place], [place] [address] [phone]  6:35 PM

## 2023-01-10 ENCOUNTER — Ambulatory Visit: Payer: BC Managed Care – PPO | Admitting: Cardiovascular Disease

## 2023-01-10 DIAGNOSIS — M791 Myalgia, unspecified site: Secondary | ICD-10-CM

## 2023-01-10 DIAGNOSIS — I4711 Inappropriate sinus tachycardia, so stated: Secondary | ICD-10-CM

## 2023-01-10 DIAGNOSIS — I2510 Atherosclerotic heart disease of native coronary artery without angina pectoris: Secondary | ICD-10-CM

## 2023-01-10 DIAGNOSIS — E785 Hyperlipidemia, unspecified: Secondary | ICD-10-CM

## 2023-01-18 DIAGNOSIS — Z1509 Genetic susceptibility to other malignant neoplasm: Secondary | ICD-10-CM | POA: Diagnosis not present

## 2023-01-18 DIAGNOSIS — R7303 Prediabetes: Secondary | ICD-10-CM | POA: Diagnosis not present

## 2023-01-18 DIAGNOSIS — E66812 Obesity, class 2: Secondary | ICD-10-CM | POA: Diagnosis not present

## 2023-01-18 DIAGNOSIS — K660 Peritoneal adhesions (postprocedural) (postinfection): Secondary | ICD-10-CM | POA: Diagnosis not present

## 2023-01-18 DIAGNOSIS — Z800 Family history of malignant neoplasm of digestive organs: Secondary | ICD-10-CM | POA: Diagnosis not present

## 2023-01-18 DIAGNOSIS — Z85828 Personal history of other malignant neoplasm of skin: Secondary | ICD-10-CM | POA: Diagnosis not present

## 2023-01-18 DIAGNOSIS — Z803 Family history of malignant neoplasm of breast: Secondary | ICD-10-CM | POA: Diagnosis not present

## 2023-01-18 DIAGNOSIS — D271 Benign neoplasm of left ovary: Secondary | ICD-10-CM | POA: Diagnosis not present

## 2023-01-18 DIAGNOSIS — Z9013 Acquired absence of bilateral breasts and nipples: Secondary | ICD-10-CM | POA: Diagnosis not present

## 2023-01-18 DIAGNOSIS — Z1589 Genetic susceptibility to other disease: Secondary | ICD-10-CM | POA: Diagnosis not present

## 2023-01-18 DIAGNOSIS — Z7984 Long term (current) use of oral hypoglycemic drugs: Secondary | ICD-10-CM | POA: Diagnosis not present

## 2023-01-18 DIAGNOSIS — Z853 Personal history of malignant neoplasm of breast: Secondary | ICD-10-CM | POA: Diagnosis not present

## 2023-01-18 DIAGNOSIS — Z0489 Encounter for examination and observation for other specified reasons: Secondary | ICD-10-CM | POA: Diagnosis not present

## 2023-01-18 DIAGNOSIS — Z1502 Genetic susceptibility to malignant neoplasm of ovary: Secondary | ICD-10-CM | POA: Diagnosis not present

## 2023-01-18 DIAGNOSIS — Z1501 Genetic susceptibility to malignant neoplasm of breast: Secondary | ICD-10-CM | POA: Diagnosis not present

## 2023-01-18 DIAGNOSIS — Z4002 Encounter for prophylactic removal of ovary: Secondary | ICD-10-CM | POA: Diagnosis not present

## 2023-01-18 DIAGNOSIS — Z8041 Family history of malignant neoplasm of ovary: Secondary | ICD-10-CM | POA: Diagnosis not present

## 2023-01-18 DIAGNOSIS — E282 Polycystic ovarian syndrome: Secondary | ICD-10-CM | POA: Diagnosis not present

## 2023-01-31 ENCOUNTER — Encounter: Payer: Self-pay | Admitting: Internal Medicine

## 2023-01-31 ENCOUNTER — Ambulatory Visit: Payer: BC Managed Care – PPO | Admitting: Internal Medicine

## 2023-01-31 ENCOUNTER — Other Ambulatory Visit: Payer: Self-pay | Admitting: Internal Medicine

## 2023-01-31 VITALS — BP 120/70 | HR 84 | Temp 98.0°F | Ht 62.0 in | Wt 182.0 lb

## 2023-01-31 DIAGNOSIS — R0681 Apnea, not elsewhere classified: Secondary | ICD-10-CM

## 2023-01-31 DIAGNOSIS — E782 Mixed hyperlipidemia: Secondary | ICD-10-CM | POA: Diagnosis not present

## 2023-01-31 DIAGNOSIS — T466X5A Adverse effect of antihyperlipidemic and antiarteriosclerotic drugs, initial encounter: Secondary | ICD-10-CM

## 2023-01-31 DIAGNOSIS — M791 Myalgia, unspecified site: Secondary | ICD-10-CM

## 2023-01-31 DIAGNOSIS — R200 Anesthesia of skin: Secondary | ICD-10-CM

## 2023-01-31 DIAGNOSIS — E559 Vitamin D deficiency, unspecified: Secondary | ICD-10-CM

## 2023-01-31 DIAGNOSIS — K219 Gastro-esophageal reflux disease without esophagitis: Secondary | ICD-10-CM | POA: Diagnosis not present

## 2023-01-31 DIAGNOSIS — R9431 Abnormal electrocardiogram [ECG] [EKG]: Secondary | ICD-10-CM

## 2023-01-31 DIAGNOSIS — R7303 Prediabetes: Secondary | ICD-10-CM

## 2023-01-31 DIAGNOSIS — R202 Paresthesia of skin: Secondary | ICD-10-CM | POA: Diagnosis not present

## 2023-01-31 LAB — LIPID PANEL
Cholesterol: 214 mg/dL — ABNORMAL HIGH (ref 0–200)
HDL: 60.1 mg/dL (ref >39.00)
LDL Cholesterol: 123 mg/dL — ABNORMAL HIGH (ref 0–99)
NonHDL: 154.17
Total CHOL/HDL Ratio: 4
Triglycerides: 154 mg/dL — ABNORMAL HIGH (ref 0.0–149.0)
VLDL: 30.8 mg/dL (ref 0.0–40.0)

## 2023-01-31 LAB — HEMOGLOBIN A1C: Hgb A1c MFr Bld: 6.4 % (ref 4.6–6.5)

## 2023-01-31 LAB — VITAMIN B12: Vitamin B-12: 388 pg/mL (ref 211–911)

## 2023-01-31 LAB — VITAMIN D 25 HYDROXY (VIT D DEFICIENCY, FRACTURES): VITD: 20.94 ng/mL — ABNORMAL LOW (ref 30.00–100.00)

## 2023-01-31 NOTE — Patient Instructions (Addendum)
We will do the sleep study and the cardiology referral.   We will check the labs today and the breath test.

## 2023-01-31 NOTE — Progress Notes (Signed)
   Subjective:   Patient ID: Samantha Andersen, female    DOB: Jun 12, 1965, 58 y.o.   MRN: [id number]  HPI The patient is a 58 YO female coming in for several concerns including abnormal EKG at [facility] prior to recent surgery (did stress test which was normal but her cardiologist retired some time ago and needs a new one, EKG scanned into media tab) and sleep apnea concerns during recent surgery (apnea maybe during surgery noticed they told her to get sleep study) and h pylori concerns (worsening gerd and recent travel to [place], no stomach pain, taking dexilant), and tingling in legs (concerned about b12 deficiency recent thyroid levels normal, pre-diabetes needs follow up).   Review of Systems  Constitutional: Negative.   HENT: Negative.    Eyes: Negative.   Respiratory:  Negative for cough, chest tightness and shortness of breath.   Cardiovascular:  Negative for chest pain, palpitations and leg swelling.  Gastrointestinal:  Negative for abdominal distention, abdominal pain, constipation, diarrhea, nausea and vomiting.  Musculoskeletal: Negative.   Skin: Negative.   Neurological:  Positive for numbness.  Psychiatric/Behavioral: Negative.      Objective:  Physical Exam Constitutional:      Appearance: She is well-developed.  HENT:     Head: Normocephalic and atraumatic.  Cardiovascular:     Rate and Rhythm: Normal rate and regular rhythm.  Pulmonary:     Effort: Pulmonary effort is normal. No respiratory distress.     Breath sounds: Normal breath sounds. No wheezing or rales.  Abdominal:     General: Bowel sounds are normal. There is no distension.     Palpations: Abdomen is soft.     Tenderness: There is no abdominal tenderness. There is no rebound.  Musculoskeletal:     Cervical back: Normal range of motion.  Skin:    General: Skin is warm and dry.  Neurological:     Mental Status: She is alert and oriented to person, place, and time.     Coordination: Coordination normal.      Vitals:   01/31/23 1415  BP: 120/70  Pulse: 84  Temp: 98 F (36.7 C)  TempSrc: Oral  SpO2: 98%  Weight: 182 lb (82.6 kg)  Height: 5\' 2"  (1.575 m)    Assessment & Plan:

## 2023-02-01 ENCOUNTER — Encounter: Payer: Self-pay | Admitting: Internal Medicine

## 2023-02-01 LAB — H. PYLORI BREATH TEST: H. pylori Breath Test: NOT DETECTED

## 2023-02-02 DIAGNOSIS — R0681 Apnea, not elsewhere classified: Secondary | ICD-10-CM | POA: Insufficient documentation

## 2023-02-02 DIAGNOSIS — R202 Paresthesia of skin: Secondary | ICD-10-CM | POA: Insufficient documentation

## 2023-02-02 DIAGNOSIS — K219 Gastro-esophageal reflux disease without esophagitis: Secondary | ICD-10-CM | POA: Insufficient documentation

## 2023-02-02 DIAGNOSIS — R9431 Abnormal electrocardiogram [ECG] [EKG]: Secondary | ICD-10-CM | POA: Insufficient documentation

## 2023-02-02 NOTE — Assessment & Plan Note (Signed)
Noted during recent surgery. Ordered home sleep test.

## 2023-02-02 NOTE — Assessment & Plan Note (Addendum)
Feels this is worsening. Ordered H pylori and adjust as needed. Is taking dexilant 60 mg daily.

## 2023-02-02 NOTE — Assessment & Plan Note (Signed)
Referral done to cardiology as her cardiologist has retired. EKG scanned into media she states recent stress test normal but I cannot review this with her.

## 2023-02-03 NOTE — Assessment & Plan Note (Signed)
Checking lipid panel and adjust repatha as needed.

## 2023-02-03 NOTE — Assessment & Plan Note (Signed)
Checking vitamin D and adjust as needed.

## 2023-02-03 NOTE — Assessment & Plan Note (Signed)
New problem so checking HgA1c and B12 and vitamin D. Recent thyroid testing normal.

## 2023-02-03 NOTE — Assessment & Plan Note (Signed)
Is intolerant to statins and is taking repatha.

## 2023-02-03 NOTE — Assessment & Plan Note (Signed)
Due for follow up and taking metformin 1000 mg daily. Adjust as needed checking HgA1c.

## 2023-02-06 ENCOUNTER — Encounter: Payer: Self-pay | Admitting: Internal Medicine

## 2023-02-10 ENCOUNTER — Ambulatory Visit: Payer: BC Managed Care – PPO | Attending: Cardiology | Admitting: Cardiology

## 2023-02-10 VITALS — BP 100/64 | HR 103 | Resp 16 | Ht 62.0 in | Wt 188.0 lb

## 2023-02-10 DIAGNOSIS — Z789 Other specified health status: Secondary | ICD-10-CM

## 2023-02-10 DIAGNOSIS — R0683 Snoring: Secondary | ICD-10-CM

## 2023-02-10 DIAGNOSIS — E785 Hyperlipidemia, unspecified: Secondary | ICD-10-CM

## 2023-02-10 DIAGNOSIS — E7800 Pure hypercholesterolemia, unspecified: Secondary | ICD-10-CM

## 2023-02-10 DIAGNOSIS — I4711 Inappropriate sinus tachycardia, so stated: Secondary | ICD-10-CM

## 2023-02-10 DIAGNOSIS — R9431 Abnormal electrocardiogram [ECG] [EKG]: Secondary | ICD-10-CM | POA: Diagnosis not present

## 2023-02-10 NOTE — Patient Instructions (Addendum)
Medication Instructions:  Your physician recommends that you continue on your current medications as directed. Please refer to the Current Medication list given to you today.  *If you need a refill on your cardiac medications before your next appointment, please call your pharmacy*  Lab Work: None ordered today. If you have labs (blood work) drawn today and your tests are completely normal, you will receive your results only by: MyChart Message (if you have MyChart) OR A paper copy in the mail If you have any lab test that is abnormal or we need to change your treatment, we will call you to review the results.  Testing/Procedures: None ordered today.  Follow-Up: At [facility], you and your health needs are our priority.  As part of our continuing mission to provide you with exceptional heart care, we have created designated Provider Care Teams.  These Care Teams include your primary Cardiologist (physician) and Advanced Practice Providers (APPs -  Physician Assistants and Nurse Practitioners) who all work together to provide you with the care you need, when you need it.   Your next appointment:   1 year(s)  The format for your next appointment:   In Person  Provider:   Tessa Lerner, DO {  Other Instructions You have been referred to sleep study by your provider. Someone will reach out to you to schedule an appointment.

## 2023-02-10 NOTE — Progress Notes (Unsigned)
Cardiology Office Note:  .   Date:  02/10/2023  ID:  Samantha Andersen, DOB 22-Nov-1965, MRN [id number] PCP:  Myrlene Broker, MD  Former Cardiology Providers: Dr. Delton See, Dr. Lowella [facility] Providers Cardiologist:  Meriam Sprague, MD (Inactive) , [facility] (established care 07/12/2021) Electrophysiologist:  None  Click to update primary MD,subspecialty MD or APP then REFRESH:1}    No chief complaint on file.   History of Present Illness: .   Samantha Andersen is a 58 y.o. *** female whose past medical history and cardiovascular risk factors includes: hx of breast CA, PCOS, prediabetes, anemia, anxiety, HLD, celiac disease, ankylosing spondylitis, inappropriate sinus tachycardia.       Review of Systems: .   Review of Systems  Cardiovascular:  Positive for dyspnea on exertion. Negative for chest pain, claudication, irregular heartbeat, leg swelling, near-syncope, orthopnea, palpitations, paroxysmal nocturnal dyspnea and syncope.  Respiratory:  Negative for shortness of breath.   Hematologic/Lymphatic: Negative for bleeding problem.    Studies Reviewed:   EKG: EKG Interpretation Date/Time:  Friday February 10 2023 14:51:56 EST Ventricular Rate:  95 PR Interval:  152 QRS Duration:  78 QT Interval:  344 QTC Calculation: 432 R Axis:   6  Text Interpretation: Normal sinus rhythm Inferior infarct , age undetermined Cannot rule out Anterior infarct , age undetermined No previous ECGs available Confirmed by Tessa Lerner [phone]) on 02/10/2023 3:06:55 PM  Echocardiogram:  04/16/2021:  1. Left ventricular ejection fraction, by estimation, is 60 to 65%. The  left ventricle has normal function. The left ventricle has no regional  wall motion abnormalities. Left ventricular diastolic parameters were  normal.   2. Right ventricular systolic function is normal. The right ventricular  size is normal.   3. Left atrial size was mildly dilated.   4. The mitral valve is  normal in structure. Trivial mitral valve  regurgitation. No evidence of mitral stenosis.   5. The aortic valve is normal in structure. Aortic valve regurgitation is  not visualized. Aortic valve sclerosis/calcification is present, without  any evidence of aortic stenosis.   6. The inferior vena cava is normal in size with greater than 50%  respiratory variability, suggesting right atrial pressure of 3 mmHg.   ETT 01/2017: Blood pressure demonstrated a normal response to exercise. There was no ST segment deviation noted during stress. No T wave inversion was noted during stress.  Stress Echo 12/28/2022: NORMAL STRESS ECHOCARDIOGRAM. NORMAL RESTING STUDY WITH NO WALL MOTION ABNORMALITIES AT REST AND PEAK STRESS. Maximum workload of 11.2 METs was achieved during exercise.    Coronary CT 04/09/2021:  1. Mild CAD in proximal RCA, CADRADS = 2.   2. Coronary calcium score is 10, which places the patient in the 78th percentile for age and sex matched control.   3. Normal coronary origins with right dominance.   CAD-RADS 2. Mild non-obstructive CAD (25-49%). Consider non-atherosclerotic causes of chest pain. Consider preventive therapy and risk factor modification.  RADIOLOGY: NA   Risk Assessment/Calculations:   ***   Labs:       Latest Ref Rng & Units 06/08/2022    8:42 AM 11/04/2021   10:04 AM 10/13/2020    7:47 AM  CBC  WBC 4.0 - 10.5 K/uL 6.0  8.0  6.8   Hemoglobin 12.0 - 15.0 g/dL 65.7  84.6  96.2   Hematocrit 36.0 - 46.0 % 40.8  41.4  41.5   Platelets 150.0 - 400.0 K/uL 255.0  352.0  375  Latest Ref Rng & Units 06/08/2022    8:42 AM 11/04/2021   10:04 AM 04/20/2021    9:52 AM  BMP  Glucose 70 - 99 mg/dL 88  161  83   BUN 6 - 23 mg/dL 15  15  17    Creatinine 0.40 - 1.20 mg/dL 0.96  0.45  4.09   BUN/Creat Ratio 9 - 23   22   Sodium 135 - 145 mEq/L 140  137  142   Potassium 3.5 - 5.1 mEq/L 4.0  3.8  4.5   Chloride 96 - 112 mEq/L 104  103  104   CO2 19 - 32  mEq/L 29  24  23    Calcium 8.4 - 10.5 mg/dL 9.5  9.7  9.7       Latest Ref Rng & Units 06/08/2022    8:42 AM 11/04/2021   10:04 AM 04/20/2021    9:52 AM  CMP  Glucose 70 - 99 mg/dL 88  811  83   BUN 6 - 23 mg/dL 15  15  17    Creatinine 0.40 - 1.20 mg/dL 9.14  7.82  9.56   Sodium 135 - 145 mEq/L 140  137  142   Potassium 3.5 - 5.1 mEq/L 4.0  3.8  4.5   Chloride 96 - 112 mEq/L 104  103  104   CO2 19 - 32 mEq/L 29  24  23    Calcium 8.4 - 10.5 mg/dL 9.5  9.7  9.7   Total Protein 6.0 - 8.3 g/dL 7.2  7.4  7.1   Total Bilirubin 0.2 - 1.2 mg/dL 0.4  0.3  0.3   Alkaline Phos 39 - 117 U/L 48  56  67   AST 0 - 37 U/L 16  24  24    ALT 0 - 35 U/L 15  24  25      Lab Results  Component Value Date   CHOL 214 (H) 01/31/2023   HDL 60.10 01/31/2023   LDLCALC 123 (H) 01/31/2023   TRIG 154.0 (H) 01/31/2023   CHOLHDL 4 01/31/2023   No results for input(s): "LIPOA" in the last 8760 hours. No components found for: "NTPROBNP" No results for input(s): "PROBNP" in the last 8760 hours. No results for input(s): "TSH" in the last 8760 hours.  ***  Physical Exam:    Today's Vitals   02/10/23 1449  BP: 100/64  Pulse: (!) 103  Resp: 16  SpO2: 96%  Weight: 188 lb (85.3 kg)  Height: 5\' 2"  (1.575 m)   Body mass index is 34.39 kg/m. Wt Readings from Last 3 Encounters:  02/10/23 188 lb (85.3 kg)  01/31/23 182 lb (82.6 kg)  08/11/22 163 lb (73.9 kg)    Physical Exam   Impression & Recommendation(s):  Impression:   ICD-10-CM   1. Hyperlipidemia, unspecified hyperlipidemia type  E78.5 EKG 12-Lead       Recommendation(s):  ***  Orders Placed:  Orders Placed This Encounter  Procedures   EKG 12-Lead    As part of medical decision making ***  Final Medication List:   No orders of the defined types were placed in this encounter.   Medications Discontinued During This Encounter  Medication Reason   diazepam (VALIUM) 5 MG tablet Patient Preference     Current Outpatient  Medications:    acetaminophen (TYLENOL) 325 MG tablet, Take 650 mg by mouth as needed., Disp: , Rfl:    aspirin EC 81 MG tablet, Take 1 tablet (81 mg total) by  mouth daily. Swallow whole., Disp: 90 tablet, Rfl: 3   Evolocumab (REPATHA SURECLICK) 140 MG/ML SOAJ, Inject 1 pen. into the skin every 14 (fourteen) days., Disp: 2 mL, Rfl: 11   metFORMIN (GLUCOPHAGE-XR) 500 MG 24 hr tablet, TAKE 2 TABLETS BY MOUTH EVERY DAY, Disp: 60 tablet, Rfl: 0   Vitamin D, Ergocalciferol, (DRISDOL) 1.25 MG (50000 UNIT) CAPS capsule, Take 1 capsule (50,000 Units total) by mouth every 30 (thirty) days., Disp: 3 capsule, Rfl: 0   dexlansoprazole (DEXILANT) 60 MG capsule, Take 60 mg by mouth daily. (Patient not taking: Reported on 02/10/2023), Disp: , Rfl:   Consent:   ***  Disposition:   *** Patient may be asked to follow-up sooner based on the results of the above-mentioned testing.  Her questions and concerns were addressed to her satisfaction. She voices understanding of the recommendations provided during this encounter.    Signed, Tessa Lerner, DO, [facility]  [facility]  [address] [place], [place] [address] 02/10/2023 3:07 PM

## 2023-02-11 ENCOUNTER — Encounter: Payer: Self-pay | Admitting: Cardiology

## 2023-02-13 ENCOUNTER — Ambulatory Visit: Payer: BC Managed Care – PPO

## 2023-02-25 ENCOUNTER — Encounter: Payer: Self-pay | Admitting: Internal Medicine

## 2023-02-28 ENCOUNTER — Telehealth: Payer: Self-pay

## 2023-02-28 ENCOUNTER — Telehealth: Payer: Self-pay | Admitting: Cardiology

## 2023-02-28 NOTE — Telephone Encounter (Signed)
STAT if patient feels like he/she is going to faint   1. Are you feeling dizzy, lightheaded, or faint right now? Yes     2. Have you passed out?  No  (If yes move to .[facility])   3. Do you have any other symptoms? Heart Palpitations, headache   4. Have you checked your HR and BP (record if available)? No

## 2023-02-28 NOTE — Telephone Encounter (Signed)
STAT call received from front desk and pt states that she is having dizziness and heart palpitation. Blood pressure is currently 128/90 with heart rate of 97. Pt reports that dizziness and palpations occur after activity. Pt denies SHOB, chest pain, and swelling. Pt states that she has had a cold the last 10 days. Pt declines taking OTC medication, other than taking aspirin. Pt reports that symptoms have occurred the last 2 days. Admits that after activity when she initially sits down her O2 is in the 90s and then after 5 minutes or so her O2 drops to low 90s to 87. Spoke to DOD (Dr. Jacinto Halim) and he states to monitor and if symptoms continue to progress then to go to urgent care or follow up with PCP. Pt agreed with plan of care and denies further questions.

## 2023-02-28 NOTE — Telephone Encounter (Signed)
Refer to telephone encounter from 2/18

## 2023-03-29 ENCOUNTER — Ambulatory Visit (HOSPITAL_BASED_OUTPATIENT_CLINIC_OR_DEPARTMENT_OTHER): Payer: BC Managed Care – PPO | Attending: Internal Medicine | Admitting: Internal Medicine

## 2023-03-29 DIAGNOSIS — Z6835 Body mass index (BMI) 35.0-35.9, adult: Secondary | ICD-10-CM | POA: Diagnosis not present

## 2023-03-29 DIAGNOSIS — Z01419 Encounter for gynecological examination (general) (routine) without abnormal findings: Secondary | ICD-10-CM | POA: Diagnosis not present

## 2023-03-29 DIAGNOSIS — R0681 Apnea, not elsewhere classified: Secondary | ICD-10-CM

## 2023-03-29 DIAGNOSIS — G4733 Obstructive sleep apnea (adult) (pediatric): Secondary | ICD-10-CM | POA: Diagnosis not present

## 2023-04-02 DIAGNOSIS — R0681 Apnea, not elsewhere classified: Secondary | ICD-10-CM

## 2023-04-02 NOTE — Procedures (Signed)
 [facility] [address] [place], [place] [address] Tel: [phone]   Fax: [phone]  Home Sleep Test Interpretation  Patient Name: Samantha, Andersen Date: 03/29/2023  Date of Birth: [birth year] Study Type: HST  Age: 58 year MRN #: [id number]  Sex: Female Interpreting Physician: Jetty Duhamel [id number]  Height: 5' Referring Physician: Hillard Danker, MD  Weight: 189.0 lbs Recording Tech: Elaina Pattee RPSGT RST  BMI: 37.1 Scoring Tech: Holly Neeriemer RPSGT RST   Indications for Polysomnography The patient is a 58 year-old Female who is 5' and weighs 189.0 lbs. Her BMI equals 37.1.  A home sleep apnea test was performed to evaluate for -.OSA  Medication  No Data.   Polysomnogram Data A home sleep test recorded the standard physiologic parameters including EKG, nasal and oral airflow.  Respiratory parameters of chest and abdominal movements were recorded with Respiratory Inductance Plethysmography belts.  Oxygen saturation was recorded by pulse oximetry.   Study Architecture The total recording time of the polysomnogram was 452.0 minutes.  The total monitoring time was 452.5 minutes.  Time spent in Supine position was 346.0 minutes.   Respiratory Events The study revealed a presence of 4 obstructive, - central, and - mixed apneas resulting in an Apnea index of 0.5 events per hour.  There were 109 hypopneas (>=3% desaturation and/or arousal) resulting in an Apnea\Hypopnea Index (AHI >=3% desaturation and/or arousal) of 15.0 events per hour.  There were 56 hypopneas (>=4% desaturation) resulting in an Apnea\Hypopnea Index (AHI >=4% desaturation) of 8.0 events per hour.  There were - Respiratory Effort Related Arousals resulting in a RERA index of - events per hour. The Respiratory Disturbance Index is 15.0 events per hour.  The snore index was 3.7 events per hour.  Mean oxygen saturation was 94.3%.  The lowest oxygen saturation  during monitoring time was 86.0%.  Time spent <=88% oxygen saturation was 1.4 minutes (0.3%).  Cardiac Summary The average pulse rate was 91.9 bpm.  The minimum pulse rate was 73.0 bpm while the maximum pulse rate was 116.0 bpm.  Cardiac rhythm was normal.  Comment: Mild obstructive sleep apnea, AHI (4%) 8/hr. Snoring with oxygen desaturation to a nadir of 86%, mean 94.3%.  Diagnosis: Obstructive sleep apnea  Recommendations: Conservative management may include observation, weight loss and sleep position off back. Other options, including CPAP or a fitted oral appliance, would be based on clinical judgment.   This study was personally reviewed and electronically signed by: Jetty Duhamel, MD Accredited Board Certified in Sleep Medicine Date/Time: 04/02/23  12:58   Study Overview  Recording Time: 601.2 min. Monitoring Time: 452.5 min.  Analysis Start:  09:13:09 PM Supine Time: 346.0 min.  Analysis Stop:  04:45:07 AM     Study Summary   Count Index Longest Event Duration  Apneas & Hypopneas: 113 15.0  Apneas: 18.9 sec.     Hypopneas: 55.9 sec.  RERAs: - - - sec.  Desaturations: 119 15.9 109.0 sec.  Snores: 28 3.7 14.5 sec.    Minimum Oxygen Saturation: 86.0%    Respiratory Summary   Total Duration Supine Non-Supine   Count Index Average Longest Count Index Count Index  Obstructive Apnea 4 0.5 16.2 18.9 4 0.7 - -   Mixed Apnea - - - - - - - -   Central Apnea - - - - - - - -   Total Apneas 4 0.5 16.2 18.9 4 0.7 - -  Hypopneas 3% 109 14.5 N.A. N.A. 100 17.3 9 5.1   Apneas & Hyp. 3% 113 15.0 N.A. N.A. 104 18.0 9 5.1            Hypopneas 4% 56 7.4 N.A. N.A. 50 8.7 6 3.4  Apneas & Hyp. 4% 60 8.0 N.A. N.A. 54 9.4 6 3.4             RERAs - - - - - - - -  RDI 115 15.2 N.A. N.A. 105 18.2 10 5.6   Oxygen Saturation Summary   Total Supine Non-Supine  Average SpO2 94.3% 94.2% 94.3%  Minimum SpO2 86.0% 86.0% 89.0%   Maximum SpO2 99.0% 99.0% 97.0%   Oxygen Saturation  Distribution  Range (%) Time in range (min) Time in range (%)  90.0 - 100.0 444.7 98.4%  80.0 - 90.0 5.8 1.3%  70.0 - 80.0 - -  60.0 - 70.0 - -  50.0 - 60.0 - -  0.0 - 50.0 - -  Time Spent <=88% SpO2  Range (%) Time in range (min) Time in range (%)  0.0 - 88.0 1.4 0.3%  Cardiac Summary   Total Supine Non-Supine  Average Pulse Rate (BPM) 91.9 92.4 90.6  Minimum Pulse Rate (BPM) 73.0 73.0 77.0  Maximum Pulse Rate (BPM) 116.0 113.0 116.0                    Comments  -                         Rumor Sun Diplomate, Biomedical engineer of Sleep Medicine  ELECTRONICALLY SIGNED ON:  04/02/2023, 12:54 PM [facility] PH: [phone]   FX: [phone] ACCREDITED BY THE AMERICAN ACADEMY OF SLEEP MEDICINE

## 2023-04-03 ENCOUNTER — Telehealth: Payer: Self-pay | Admitting: Internal Medicine

## 2023-04-03 NOTE — Telephone Encounter (Signed)
 Called pt and informed of sleep study results, pt verbalized understanding and had no questions at this  time

## 2023-04-03 NOTE — Telephone Encounter (Signed)
 The sleep study did show mild sleep apnea which does not require treatment.

## 2023-04-04 DIAGNOSIS — M9903 Segmental and somatic dysfunction of lumbar region: Secondary | ICD-10-CM | POA: Diagnosis not present

## 2023-04-04 DIAGNOSIS — M2141 Flat foot [pes planus] (acquired), right foot: Secondary | ICD-10-CM | POA: Diagnosis not present

## 2023-04-04 DIAGNOSIS — M2142 Flat foot [pes planus] (acquired), left foot: Secondary | ICD-10-CM | POA: Diagnosis not present

## 2023-04-04 DIAGNOSIS — M9906 Segmental and somatic dysfunction of lower extremity: Secondary | ICD-10-CM | POA: Diagnosis not present

## 2023-06-02 ENCOUNTER — Telehealth: Payer: Self-pay | Admitting: Cardiology

## 2023-06-02 NOTE — Telephone Encounter (Signed)
 Attempted phone call to pt and left voicemail message regarding appointment scheduled with Dr Albert Huff on 06/12/2023 at 10am.  Reiterated that pt should also follow up with PCP and ED if needed.  Call [phone] for further questions or concers.

## 2023-06-02 NOTE — Telephone Encounter (Signed)
 Pt c/o Shortness Of Breath: STAT if SOB developed within the last 24 hours or pt is noticeably SOB on the phone  1. Are you currently SOB (can you hear that pt is SOB on the phone)? Yes   2. How long have you been experiencing SOB? 3 days now   3. Are you SOB when sitting or when up moving around? Both   4. Are you currently experiencing any other symptoms? No

## 2023-06-02 NOTE — Telephone Encounter (Addendum)
 Spoke with pt who complains of increasing DOE x 3 days.  Pt denies current CP, dizziness or edema.  She does not have a current HR or BP for review.  She does report a non productive cough and O2 sat in the lower 90's at night.  She reports she is currently at [facility] meeting a friend and just wanted to schedule an appointment.  Encouraged pt to also contact her PCP re symptoms.  Reviewed ED precautions and advised pt will work with scheduling for appointment.  Pt verbalized understanding and agrees with current plan.

## 2023-07-17 ENCOUNTER — Encounter: Payer: Self-pay | Admitting: Internal Medicine

## 2023-07-17 ENCOUNTER — Ambulatory Visit: Admitting: Internal Medicine

## 2023-07-17 VITALS — BP 118/80 | HR 85 | Temp 98.4°F | Ht 62.0 in | Wt 191.0 lb

## 2023-07-17 DIAGNOSIS — H8110 Benign paroxysmal vertigo, unspecified ear: Secondary | ICD-10-CM | POA: Diagnosis not present

## 2023-07-17 DIAGNOSIS — R0600 Dyspnea, unspecified: Secondary | ICD-10-CM | POA: Diagnosis not present

## 2023-07-17 DIAGNOSIS — M79651 Pain in right thigh: Secondary | ICD-10-CM

## 2023-07-17 NOTE — Patient Instructions (Signed)
 We will do the vestibular therapy to help and the lung function test.

## 2023-07-17 NOTE — Progress Notes (Unsigned)
   Subjective:   Patient ID: Samantha Andersen, female    DOB: [birth year], 58 y.o.   MRN: [id number]  HPI The patient is a 58 YO female coming in for some SOB episodes which are going on and off. Not currently in episode. Wants to assess underlying lung function. She is also having a vertigo flare up. Got a tick bite on her thigh she wants to have checked.   Review of Systems  Constitutional: Negative.   HENT: Negative.    Eyes: Negative.   Respiratory:  Positive for shortness of breath. Negative for cough and chest tightness.   Cardiovascular:  Negative for chest pain, palpitations and leg swelling.  Gastrointestinal:  Negative for abdominal distention, abdominal pain, constipation, diarrhea, nausea and vomiting.  Musculoskeletal: Negative.   Skin:  Positive for wound.  Neurological:  Positive for dizziness.  Psychiatric/Behavioral: Negative.      Objective:  Physical Exam Constitutional:      Appearance: She is well-developed.  HENT:     Head: Normocephalic and atraumatic.  Cardiovascular:     Rate and Rhythm: Normal rate and regular rhythm.  Pulmonary:     Effort: Pulmonary effort is normal. No respiratory distress.     Breath sounds: Normal breath sounds. No wheezing or rales.  Abdominal:     General: Bowel sounds are normal. There is no distension.     Palpations: Abdomen is soft.     Tenderness: There is no abdominal tenderness. There is no rebound.  Musculoskeletal:     Cervical back: Normal range of motion.  Skin:    General: Skin is warm and dry.     Comments: Wound consistent with tick bite right upper thigh without rash or infection.   Neurological:     Mental Status: She is alert and oriented to person, place, and time.     Coordination: Coordination normal.     Vitals:   07/17/23 0944  BP: 118/80  Pulse: 85  Temp: 98.4 F (36.9 C)  TempSrc: Oral  SpO2: 98%  Weight: 191 lb (86.6 kg)  Height: 5' 2 (1.575 m)    Assessment & Plan:

## 2023-07-18 DIAGNOSIS — E119 Type 2 diabetes mellitus without complications: Secondary | ICD-10-CM | POA: Diagnosis not present

## 2023-07-19 ENCOUNTER — Encounter: Payer: Self-pay | Admitting: Internal Medicine

## 2023-07-19 DIAGNOSIS — H8110 Benign paroxysmal vertigo, unspecified ear: Secondary | ICD-10-CM | POA: Insufficient documentation

## 2023-07-19 DIAGNOSIS — M79651 Pain in right thigh: Secondary | ICD-10-CM | POA: Insufficient documentation

## 2023-07-19 DIAGNOSIS — R0600 Dyspnea, unspecified: Secondary | ICD-10-CM | POA: Insufficient documentation

## 2023-07-19 NOTE — Assessment & Plan Note (Signed)
 Checking PFTs to assess for underlying structural lung concerns.

## 2023-07-19 NOTE — Assessment & Plan Note (Signed)
 Referral for vestibular therapy to help current flare.

## 2023-07-19 NOTE — Assessment & Plan Note (Signed)
 With tick bite and no signs of rash or infection. Tick on for <12 hours no antibiotics needed. Educated on tick prevention and surveillance for ticks as time on skin is most important in risk for disease.

## 2023-07-28 ENCOUNTER — Ambulatory Visit: Attending: Internal Medicine

## 2023-07-28 ENCOUNTER — Other Ambulatory Visit: Payer: Self-pay

## 2023-07-28 DIAGNOSIS — H8112 Benign paroxysmal vertigo, left ear: Secondary | ICD-10-CM | POA: Insufficient documentation

## 2023-07-28 DIAGNOSIS — R42 Dizziness and giddiness: Secondary | ICD-10-CM | POA: Diagnosis not present

## 2023-07-28 NOTE — Therapy (Signed)
 OUTPATIENT PHYSICAL THERAPY VESTIBULAR EVALUATION     Patient Name: Samantha Andersen MRN: [id number] DOB:June 19, 1965, 58 y.o., female Today's Date: 07/28/2023  END OF SESSION:  PT End of Session - 07/28/23 0759     Visit Number 1    Number of Visits 6    Date for PT Re-Evaluation 09/01/23    Authorization Type BCBS    PT Start Time 0800    PT Stop Time 0845    PT Time Calculation (min) 45 min          Past Medical History:  Diagnosis Date   Anemia    Anxiety    B12 deficiency    Back pain    Breast cancer (HCC)    left   Cancer (HCC)    Celiac disease    Complication of anesthesia    tachycardic, also reports that she needed increased anesth. 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 mild nonobstructive CAD (0-25% prox RCA, 0-25% prox LAD).    Other calcification of muscle, unspecified ankle and foot    Other fatigue    PCOS (polycystic ovarian syndrome)    PCOS (polycystic ovarian syndrome)    PONV (postoperative nausea and vomiting)    also reports N&V accompanies anxiety   Shortness of breath on exertion    Skin cancer    Spondyloarthritis    Swallowing difficulty    Vitamin D  deficiency    Past Surgical History:  Procedure Laterality Date   ABDOMINAL HYSTERECTOMY     CESAREAN SECTION     x2   MASTECTOMY Bilateral    SIMPLE MASTECTOMY WITH AXILLARY SENTINEL NODE BIOPSY  11/24/2011   Procedure: SIMPLE MASTECTOMY WITH AXILLARY SENTINEL NODE BIOPSY;  Surgeon: Sherlean JINNY Laughter, MD;  Location: [facility] OR;  Service: General;  Laterality: Left;  Bilateral Total Mastectomy and Left Sentinel Node   SIMPLE MASTECTOMY WITH AXILLARY SENTINEL  NODE BIOPSY  11/24/2011   Procedure: SIMPLE MASTECTOMY;  Surgeon: Sherlean JINNY Laughter, MD;  Location: [facility] OR;  Service: General;  Laterality: Right;   TISSUE EXPANDER PLACEMENT  11/24/2011   Procedure: TISSUE EXPANDER;  Surgeon: Alm Sick, MD;  Location: [facility] OR;  Service: Plastics;  Laterality: Bilateral;   Patient Active Problem List   Diagnosis Date Noted   Right thigh pain 07/19/2023   Dyspnea 07/19/2023   Benign paroxysmal positional vertigo 07/19/2023   Numbness and tingling of both legs 02/02/2023   Gastroesophageal reflux disease 02/02/2023   Apnea 02/02/2023   Abnormal EKG 02/02/2023   Right lateral abdominal pain 06/10/2022   Routine general medical examination at a health care facility 11/05/2021   Altered taste 08/20/2021   History of oral lesions 08/20/2021   Lymphadenopathy, cervical 08/20/2021   Urinary incontinence 10/30/2020   Eating disorder 06/11/2020   Pre-diabetes 05/28/2020   Vitamin D  deficiency 05/28/2020   Myalgia due to statin 11/13/2019   Intermittent palpitations 05/20/2015   Genetic testing 01/06/2014   Hyperlipidemia 02/18/2013   Iron deficiency anemia 05/21/2012   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 Her son is a neuro DNP and performed Epley but notes now the symptoms seem to have shifted. Notes lying back in bed, tipping head back,  and difficulty with showering with tipping forward to shave legs or tip head back to wash hair and performing yoga still provokes. Notes increased frequency  of migraine lately with photophonia, no phonophobia, no otalgia. Notes her job responsibilities have been impacted and difficulty focusing Pt accompanied by: self  PERTINENT HISTORY:   PAIN:  Are you having pain? No  PRECAUTIONS: None  RED FLAGS: None   WEIGHT BEARING RESTRICTIONS: No  FALLS: Has patient fallen in last 6 months? Yes. Number of falls I fall when hiking, no strike to head  LIVING ENVIRONMENT: Lives with: lives with their family Lives in: House/apartment Stairs:   Has following equipment at home: None  PLOF: Independent  PATIENT GOALS: eliminate symptoms  OBJECTIVE:  Note: Objective measures were completed at Evaluation unless otherwise noted.  DIAGNOSTIC FINDINGS:   COGNITION: Overall cognitive status: Within functional limits for tasks assessed   SENSATION: WFL    POSTURE:  No Significant postural limitations  Cervical ROM:    Active A/PROM (deg) eval  Flexion WFL  Extension WFL  Right lateral flexion WFL  Left lateral flexion WFL  Right rotation WFL  Left rotation WFL  (Blank rows = not tested)  Independent with functional mobility     FUNCTIONAL TESTS:  FGA: TBD M-CTSIB: TBD  PATIENT SURVEYS:  DHI: 36  VESTIBULAR ASSESSMENT:  GENERAL OBSERVATION: wears progressive/transition lens glasses   SYMPTOM BEHAVIOR:  Subjective history: positional dizziness since January   Non-Vestibular symptoms: nausea/vomiting and migraine symptoms  Type of dizziness: Spinning/Vertigo and World moves  Frequency: daily  Duration: seconds to minute  Aggravating factors: Induced by position change: lying supine, rolling to the right, and rolling to the left and Induced by motion: looking up at the ceiling, bending down to the ground, turning body quickly, and turning head quickly  Relieving factors: closing eyes and slow movements  Progression of symptoms: better  OCULOMOTOR EXAM:  Ocular Alignment: normal  Ocular ROM: No  Limitations  Spontaneous Nystagmus: absent  Gaze-Induced Nystagmus: absent  Smooth Pursuits: intact  Saccades: intact  Convergence/Divergence: 4 cm --does not see double    VESTIBULAR - OCULAR REFLEX:   Slow VOR: Normal  VOR Cancellation: Normal  Head-Impulse Test: HIT Right: negative HIT Left: negative  Dynamic Visual Acuity: Not able to be assessed   POSITIONAL TESTING: Right Dix-Hallpike: no nystagmus Left Dix-Hallpike: upbeating, left nystagmus Right Roll Test: no nystagmus Left Roll Test: no nystagmus  Left upbeating nystagmus x 20 sec  MOTION SENSITIVITY:  Motion Sensitivity Quotient Intensity: 0 = none, 1 = Lightheaded, 2 = Mild, 3 = Moderate, 4 = Severe, 5 = Vomiting  Intensity  1. Sitting to supine   2. Supine to L side   3. Supine to R side   4. Supine to sitting   5. L Hallpike-Dix   6. Up from L    7. R Hallpike-Dix   8. Up from R    9. Sitting, head tipped to L knee   10. Head up  from L knee   11. Sitting, head tipped to R knee   12. Head up from R knee   13. Sitting head turns x5   14.Sitting head nods x5   15. In stance, 180 turn to L    16. In stance, 180 turn to R     OTHOSTATICS: not done  FUNCTIONAL GAIT:                                                                                                                              TREATMENT DATE: 07/28/23   Canalith Repositioning:  Epley Left: Comment: 1 rep   PATIENT EDUCATION: Education details: assessment details, rationale of intervention Person educated: Patient Education method: Explanation Education comprehension: verbalized understanding  HOME EXERCISE PROGRAM:  GOALS: Goals reviewed with patient? Yes  SHORT TERM GOALS: Target date: same as LTG    LONG TERM GOALS: Target date: 09/01/2023    Patient will be independent in HEP to improve functional outcomes Baseline:  Goal status: INITIAL  2.  Report improved symptoms and decreased dysfunction per score 19/100  DHI Baseline: 36/100 Goal status: INITIAL  3.  Pt to be free of positional dizziness for return to typical activities (e.g. yoga) Baseline: +left Dix-Hallpike Goal status: INITIAL  4.  Score 28/30 Functional Gait Assessment for low risk for falls and improved safety with mobility Baseline: TBD Goal status: INITIAL   ASSESSMENT:  CLINICAL IMPRESSION: Patient is a 58 y.o. lady who was seen today for physical therapy evaluation and treatment for BPPV/dizziness. Oculomotor and VOR unremarkable. Symptomatic to movements of head/neck primarily into extension and with tasks requiring forward bending causing impairment in activities of choice and ADL. Positional tests reveal left upbeating nystagmus x 20 sec duration w/ left Dix-Hallpike and initiated tx w/ left Epley maneuver w/ resolution of symptoms on f/u left Dix-Hallpike.   Pt educated on post-maneuver considerations and given her chronic nature would recommend f/u visits for ongoing assessment and treatment as indicated to address symptoms and deficits.    OBJECTIVE IMPAIRMENTS: decreased balance and dizziness.   ACTIVITY LIMITATIONS: bending, sleeping, bed mobility, bathing, reach over head, and locomotion level  PARTICIPATION LIMITATIONS: meal prep, cleaning, laundry, and interpersonal relationship  PERSONAL FACTORS: Time since onset of injury/illness/exacerbation are also affecting patient's functional outcome.   REHAB POTENTIAL: Excellent  CLINICAL DECISION MAKING: Stable/uncomplicated  EVALUATION COMPLEXITY: Low   PLAN:  PT FREQUENCY: 1-2x/week  PT DURATION: 5 weeks  PLANNED INTERVENTIONS: 97750- Physical Performance Testing, 97110-Therapeutic exercises, 97530- Therapeutic activity, [id number]- Neuromuscular re-education, 97535- Self Care, 02859- Manual therapy, [id number]- Gait training, [id number]- Canalith repositioning, and 20560 (1-2 muscles), 20561 (3+ muscles)- Dry Needling  PLAN FOR NEXT SESSION: re-check to left Dix-Hallpike  and tx as indicated. FGA. HEP as indicated   9:00 AM, 07/28/23 M. Kelly Dawid Dupriest, PT, DPT Physical Therapist- [place] Office Number: [phone]   For all possible CPT codes, reference the Planned Interventions line above.  Check all conditions that are expected to impact treatment: {Conditions expected to impact treatment:None of these apply   If treatment provided at initial evaluation, no treatment charged due to lack of authorization.

## 2023-08-04 ENCOUNTER — Ambulatory Visit: Admitting: Physical Therapy

## 2023-08-04 ENCOUNTER — Encounter: Payer: Self-pay | Admitting: Physical Therapy

## 2023-08-04 DIAGNOSIS — R42 Dizziness and giddiness: Secondary | ICD-10-CM | POA: Diagnosis not present

## 2023-08-04 DIAGNOSIS — H8112 Benign paroxysmal vertigo, left ear: Secondary | ICD-10-CM

## 2023-08-04 NOTE — Therapy (Signed)
 OUTPATIENT PHYSICAL THERAPY TREATMENT   Patient Name: Samantha Andersen MRN: [id number] DOB:08/19/1965, 58 y.o., female Today's Date: 08/04/2023  END OF SESSION:  PT End of Session - 08/04/23 1013     Visit Number 2    Number of Visits 6    Date for PT Re-Evaluation 09/01/23    Authorization Type BCBS    Authorization Time Period approved 5 PT visits from 07/28/2023 - 09/25/2023    Authorization - Visit Number 1    Authorization - Number of Visits 5    PT Start Time 1013    PT Stop Time 1055    PT Time Calculation (min) 42 min    Activity Tolerance Patient tolerated treatment well    Behavior During Therapy WFL for tasks assessed/performed           Past Medical History:  Diagnosis Date   Anemia    Anxiety    B12 deficiency    Back pain    Breast cancer (HCC)    left   Cancer (HCC)    Celiac disease    Complication of anesthesia    tachycardic, also reports that she needed increased anesth. 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 mild nonobstructive CAD (0-25% prox RCA, 0-25% prox LAD).    Other calcification of muscle, unspecified ankle and foot    Other fatigue    PCOS (polycystic ovarian syndrome)    PCOS (polycystic ovarian syndrome)    PONV (postoperative nausea and vomiting)    also reports N&V accompanies anxiety   Shortness of breath on exertion    Skin cancer    Spondyloarthritis    Swallowing difficulty    Vitamin D  deficiency    Past Surgical History:  Procedure Laterality Date   ABDOMINAL HYSTERECTOMY     CESAREAN SECTION     x2   MASTECTOMY Bilateral    SIMPLE MASTECTOMY WITH AXILLARY SENTINEL NODE BIOPSY   11/24/2011   Procedure: SIMPLE MASTECTOMY WITH AXILLARY SENTINEL NODE BIOPSY;  Surgeon: Sherlean JINNY Laughter, MD;  Location: [facility] OR;  Service: General;  Laterality: Left;  Bilateral Total Mastectomy and Left Sentinel Node   SIMPLE MASTECTOMY WITH AXILLARY SENTINEL NODE BIOPSY  11/24/2011   Procedure: SIMPLE MASTECTOMY;  Surgeon: Sherlean JINNY Laughter, MD;  Location: [facility] OR;  Service: General;  Laterality: Right;   TISSUE EXPANDER PLACEMENT  11/24/2011   Procedure: TISSUE EXPANDER;  Surgeon: Alm Sick, MD;  Location: [facility] OR;  Service: Plastics;  Laterality: Bilateral;   Patient Active Problem List   Diagnosis Date Noted   Right thigh pain 07/19/2023   Dyspnea 07/19/2023   Benign paroxysmal positional vertigo 07/19/2023   Numbness and tingling of both legs 02/02/2023   Gastroesophageal reflux disease 02/02/2023   Apnea 02/02/2023   Abnormal EKG 02/02/2023   Right lateral abdominal pain 06/10/2022   Routine general medical examination at a health care facility 11/05/2021   Altered taste 08/20/2021   History of oral lesions 08/20/2021   Lymphadenopathy, cervical 08/20/2021   Urinary incontinence 10/30/2020   Eating disorder 06/11/2020   Pre-diabetes 05/28/2020   Vitamin  D deficiency 05/28/2020   Myalgia due to statin 11/13/2019   Intermittent palpitations 05/20/2015   Genetic testing 01/06/2014   Hyperlipidemia 02/18/2013   Iron deficiency anemia 05/21/2012   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   From eval: Positional dizziness since January. Her son is a neuro DNP and performed Epley but notes now the symptoms seem to have shifted. Notes lying back in bed, tipping head back,  and difficulty with showering with tipping forward to shave legs or tip head back to wash hair and performing yoga still provokes. Notes increased frequency of migraine lately with photophonia, no phonophobia, no otalgia. Notes her job responsibilities have been impacted and difficulty focusing Pt accompanied by: self  PERTINENT HISTORY:   PAIN:  Are you having pain? No  PRECAUTIONS: None  RED FLAGS: None   WEIGHT BEARING RESTRICTIONS: No  FALLS: Has patient fallen in last 6 months? Yes. Number of falls I fall when hiking, no strike to head  LIVING ENVIRONMENT: Lives with: lives with their family Lives in: House/apartment Stairs:   Has following equipment at home: None  PLOF: Independent  PATIENT GOALS: eliminate symptoms  OBJECTIVE:  Note: Objective measures were completed at Evaluation unless otherwise noted.  08/04/2023 Activity Comments  L Dix-Hallpike x2 1st: 3 slow beats noted after latency of ~20 sec 2nd: no symptoms  L Epley x2   UT stretch x 30   Levator scap stretch x 30   Anterior scalene stretch x 30   Suboccipital stretch x 30    FUNCTIONAL TESTS:  FGA: TBD M-CTSIB: TBD   HOME EXERCISE PROGRAM: Access Code: [email or web address] URL: [email or web address] Date: 08/04/2023 Prepared by: Alda Gaultney April Earnie Starring  Exercises - Seated Upper Trapezius Stretch  - 1 x daily - 7 x weekly - 2 sets - 30 sec hold - Gentle Levator Scapulae Stretch  - 1 x daily - 7 x weekly - 2 sets - 30 sec hold - Seated Cervical Extension AROM  - 1 x daily - 7 x weekly - 2 sets - 30 sec hold - Sub-Occipital Cervical Stretch  - 1 x daily - 7 x weekly - 2 sets - 30 sec hold   Cervical ROM:    Active A/PROM (deg) eval  Flexion WFL  Extension WFL  Right lateral  flexion WFL  Left lateral flexion WFL  Right rotation WFL  Left rotation WFL  (Blank rows = not tested)  Independent with functional mobility  PATIENT SURVEYS:  DHI: 36  VESTIBULAR ASSESSMENT:  GENERAL OBSERVATION: wears progressive/transition lens glasses   SYMPTOM BEHAVIOR:  Subjective history: positional dizziness since January   Non-Vestibular symptoms: nausea/vomiting and migraine symptoms  Type of dizziness: Spinning/Vertigo and World moves  Frequency: daily  Duration: seconds to minute  Aggravating factors: Induced by position change: lying supine, rolling to the right, and rolling to the left and Induced by motion: looking up at the ceiling, bending down to the ground, turning body quickly, and turning head quickly  Relieving factors: closing eyes and slow movements  Progression of symptoms:  better  OCULOMOTOR EXAM:  Ocular Alignment: normal  Ocular ROM: No Limitations  Spontaneous Nystagmus: absent  Gaze-Induced Nystagmus: absent  Smooth Pursuits: intact  Saccades: intact  Convergence/Divergence: 4 cm --does not see double  VESTIBULAR - OCULAR REFLEX:   Slow VOR: Normal  VOR Cancellation: Normal  Head-Impulse Test: HIT Right: negative HIT Left: negative  Dynamic Visual Acuity: Not able to be assessed   POSITIONAL TESTING: Right Dix-Hallpike: no nystagmus Left Dix-Hallpike: upbeating, left nystagmus Right Roll Test: no nystagmus Left Roll Test: no nystagmus  Left upbeating nystagmus x 20 sec  MOTION SENSITIVITY: Not done   OTHOSTATICS: not done   PATIENT EDUCATION: Education details: assessment details, rationale of intervention Person educated: Patient Education method: Explanation Education comprehension: verbalized understanding    GOALS: Goals reviewed with patient? Yes  SHORT TERM GOALS: Target date: same as LTG    LONG TERM GOALS: Target date: 09/01/2023    Patient will be independent in HEP to improve functional  outcomes Baseline:  Goal status: MET  2.  Report improved symptoms and decreased dysfunction per score 19/100 DHI Baseline: 36/100 Goal status: IN PROGRESS  3.  Pt to be free of positional dizziness for return to typical activities (e.g. yoga) Baseline: +left Dix-Hallpike Goal status: MET  4.  Score 28/30 Functional Gait Assessment for low risk for falls and improved safety with mobility Baseline: TBD Goal status: DEFERRED   ASSESSMENT:  CLINICAL IMPRESSION: Ms. Lamiah returns with much improved symptoms. Only saw a few slow beats of nystagmus this session in room light with L Dix-Hallpike. Performed Epley and upon second check, had no symptoms. Provided gentle neck stretching as pt reports some migraine issues -- discussed that this may be due from guarding her neck/head movements due to her dizziness but she notes she has also been on a trip and not sleeping on her own bed. Discussed safe return to her normal activities such as yoga -- she remains fearful of getting dizziness. Re-educated on how to check if her otoconia are loose at home prior to yoga class and other ways she can help compensate. Pt reports understanding. Will f/u with PT PRN if her migraines continue or she has any new exacerbations. Deferred FGA due to pt already improving with her symptoms.   From eval: Patient is a 58 y.o. lady who was seen today for physical therapy evaluation and treatment for BPPV/dizziness. Oculomotor and VOR unremarkable. Symptomatic to movements of head/neck primarily into extension and with tasks requiring forward bending causing impairment in activities of choice and ADL. Positional tests reveal left upbeating nystagmus x 20 sec duration w/ left Dix-Hallpike and initiated tx w/ left Epley maneuver w/ resolution of symptoms on f/u left Dix-Hallpike.   Pt educated on post-maneuver considerations and given her chronic nature would recommend f/u visits for ongoing assessment and treatment as indicated  to address symptoms and deficits.    OBJECTIVE IMPAIRMENTS: decreased balance and dizziness.   ACTIVITY LIMITATIONS: bending, sleeping, bed mobility, bathing, reach over head, and locomotion level  PARTICIPATION LIMITATIONS: meal prep, cleaning, laundry, and interpersonal relationship  PERSONAL FACTORS: Time since onset of injury/illness/exacerbation are also affecting patient's functional outcome.   REHAB POTENTIAL: Excellent  CLINICAL DECISION MAKING: Stable/uncomplicated  EVALUATION COMPLEXITY: Low   PLAN:  PT FREQUENCY: 1-2x/week  PT DURATION: 5 weeks  PLANNED INTERVENTIONS: 97750- Physical Performance Testing, 97110-Therapeutic exercises, 97530- Therapeutic activity, [id number]- Neuromuscular re-education, 97535- Self Care, 02859- Manual therapy, [id number]- Gait training, [phone]- Canalith repositioning, and 20560 (1-2 muscles), 79438 (  3+ muscles)- Dry Needling  PLAN FOR NEXT SESSION: re-check to left Dix-Hallpike and tx as indicated. FGA. HEP as indicated   10:15 AM, 08/04/23 Asuzena Weis April Ma L Wolf Boulay, PT, DPT Physical TherapistGLENWOOD [place] Office Number: [phone]   For all possible CPT codes, reference the Planned Interventions line above.     Check all conditions that are expected to impact treatment: {Conditions expected to impact treatment:None of these apply   If treatment provided at initial evaluation, no treatment charged due to lack of authorization.

## 2023-08-17 DIAGNOSIS — E559 Vitamin D deficiency, unspecified: Secondary | ICD-10-CM | POA: Diagnosis not present

## 2023-08-17 DIAGNOSIS — R7309 Other abnormal glucose: Secondary | ICD-10-CM | POA: Diagnosis not present

## 2023-08-17 DIAGNOSIS — R432 Parageusia: Secondary | ICD-10-CM | POA: Diagnosis not present

## 2023-08-17 DIAGNOSIS — E7800 Pure hypercholesterolemia, unspecified: Secondary | ICD-10-CM | POA: Diagnosis not present

## 2023-08-30 ENCOUNTER — Telehealth: Payer: Self-pay

## 2023-08-30 DIAGNOSIS — E282 Polycystic ovarian syndrome: Secondary | ICD-10-CM | POA: Diagnosis not present

## 2023-08-30 DIAGNOSIS — R7301 Impaired fasting glucose: Secondary | ICD-10-CM | POA: Diagnosis not present

## 2023-08-30 DIAGNOSIS — E049 Nontoxic goiter, unspecified: Secondary | ICD-10-CM | POA: Diagnosis not present

## 2023-08-30 DIAGNOSIS — E559 Vitamin D deficiency, unspecified: Secondary | ICD-10-CM | POA: Diagnosis not present

## 2023-08-30 NOTE — Telephone Encounter (Signed)
 Copied from CRM #[id number]. Topic: Clinical - Request for Lab/Test Order >> Aug 30, 2023 10:21 AM Tinnie BROCKS wrote: Reason for CRM: Rollene said she would order a lung function test at 7/7 appointment, but she has not heard anything about it since then.

## 2023-08-30 NOTE — Telephone Encounter (Signed)
 Is this the pulmonary test that you ordered on 7/9?

## 2023-08-30 NOTE — Telephone Encounter (Signed)
 Called patient and gave her the name and number to where her referral was placed to patient verbalized ok to the information

## 2023-08-30 NOTE — Telephone Encounter (Signed)
 This was ordered on 7/9 can you have referrals help her schedule

## 2023-09-05 ENCOUNTER — Encounter: Payer: Self-pay | Admitting: Adult Health

## 2023-09-05 ENCOUNTER — Encounter: Payer: Self-pay | Admitting: Physical Therapy

## 2023-09-05 ENCOUNTER — Ambulatory Visit: Attending: Internal Medicine | Admitting: Physical Therapy

## 2023-09-05 ENCOUNTER — Encounter: Payer: Self-pay | Admitting: Endocrinology

## 2023-09-05 DIAGNOSIS — R2681 Unsteadiness on feet: Secondary | ICD-10-CM | POA: Diagnosis not present

## 2023-09-05 DIAGNOSIS — H8112 Benign paroxysmal vertigo, left ear: Secondary | ICD-10-CM | POA: Diagnosis not present

## 2023-09-05 DIAGNOSIS — R42 Dizziness and giddiness: Secondary | ICD-10-CM | POA: Insufficient documentation

## 2023-09-05 NOTE — Telephone Encounter (Signed)
 Auth Submission: APPROVED Site of care: Site of care: [facility] Payer: BCBS commercial Medication & CPT/J Code(s) submitted: Leqvio  (Inclisiran) J1306 Diagnosis Code:  Route of submission (phone, fax, portal): fax Phone # Fax # Auth type: Buy/Bill PB Units/visits requested: 284mg  x 2 doses Reference number: [id number] Approval from: 08/03/23 to 08/02/24

## 2023-09-05 NOTE — Therapy (Signed)
 OUTPATIENT PHYSICAL THERAPY TREATMENT/RECERT   Patient Name: Samantha Andersen MRN: [id number] DOB:January 13, 1965, 58 y.o., female Today's Date: 09/05/2023  END OF SESSION:  PT End of Session - 09/05/23 1534     Visit Number 3    Number of Visits 6    Date for PT Re-Evaluation 09/25/23    Authorization Type BC[employer]    Authorization Time Period approved 5 PT visits from 07/28/2023 - 09/25/2023    Authorization - Visit Number 2    Authorization - Number of Visits 5    PT Start Time 1535    PT Stop Time 1615    PT Time Calculation (min) 40 min    Activity Tolerance Patient tolerated treatment well    Behavior During Therapy WFL for tasks assessed/performed            Past Medical History:  Diagnosis Date   Anemia    Anxiety    B12 deficiency    Back pain    Breast cancer (HCC)    left   Cancer (HCC)    Celiac disease    Complication of anesthesia    tachycardic, also reports that she needed increased anesth. 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 mild nonobstructive CAD (0-25% prox RCA, 0-25% prox LAD).    Other calcification of muscle, unspecified ankle and foot    Other fatigue    PCOS (polycystic ovarian syndrome)    PCOS (polycystic ovarian syndrome)    PONV (postoperative nausea and vomiting)    also reports N&V accompanies anxiety   Shortness of breath on exertion    Skin cancer    Spondyloarthritis    Swallowing difficulty    Vitamin D  deficiency    Past Surgical History:  Procedure Laterality Date   ABDOMINAL HYSTERECTOMY     CESAREAN SECTION     x2   MASTECTOMY Bilateral    SIMPLE MASTECTOMY WITH AXILLARY SENTINEL NODE BIOPSY   11/24/2011   Procedure: SIMPLE MASTECTOMY WITH AXILLARY SENTINEL NODE BIOPSY;  Surgeon: Sherlean JINNY Laughter, MD;  Location: [facility] OR;  Service: General;  Laterality: Left;  Bilateral Total Mastectomy and Left Sentinel Node   SIMPLE MASTECTOMY WITH AXILLARY SENTINEL NODE BIOPSY  11/24/2011   Procedure: SIMPLE MASTECTOMY;  Surgeon: Sherlean JINNY Laughter, MD;  Location: [facility] OR;  Service: General;  Laterality: Right;   TISSUE EXPANDER PLACEMENT  11/24/2011   Procedure: TISSUE EXPANDER;  Surgeon: Alm Sick, MD;  Location: [facility] OR;  Service: Plastics;  Laterality: Bilateral;   Patient Active Problem List   Diagnosis Date Noted   Right thigh pain 07/19/2023   Dyspnea 07/19/2023   Benign paroxysmal positional vertigo 07/19/2023   Numbness and tingling of both legs 02/02/2023   Gastroesophageal reflux disease 02/02/2023   Apnea 02/02/2023   Abnormal EKG 02/02/2023   Right lateral abdominal pain 06/10/2022   Routine general medical examination at a health care facility 11/05/2021   Altered taste 08/20/2021   History of oral lesions 08/20/2021   Lymphadenopathy, cervical 08/20/2021   Urinary incontinence 10/30/2020   Eating disorder 06/11/2020   Pre-diabetes 05/28/2020  Vitamin D  deficiency 05/28/2020   Myalgia due to statin 11/13/2019   Intermittent palpitations 05/20/2015   Genetic testing 01/06/2014   Hyperlipidemia 02/18/2013   Iron deficiency anemia 05/21/2012   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   Typically have 1 every 5-8 years.  The dizziness is better, just feel it sometimes if I move too fast.  From eval: Positional dizziness since January. Her son is a neuro DNP and performed Epley but notes now the symptoms seem to have shifted. Notes lying back in bed, tipping head back,  and difficulty with showering with tipping forward to shave legs or tip head back to wash hair and performing yoga still provokes. Notes increased frequency of migraine lately with photophonia, no phonophobia, no otalgia. Notes her job responsibilities have been impacted and difficulty focusing Pt accompanied by: self  PERTINENT HISTORY:   PAIN:  Are you having pain? No *Maybe feel like a migraine is starting in the L eye*  PRECAUTIONS: None  RED FLAGS: None   WEIGHT BEARING RESTRICTIONS: No  FALLS: Has patient fallen in last 6 months? Yes. Number of falls I fall when hiking, no strike to head  LIVING ENVIRONMENT: Lives with: lives with their family Lives in: House/apartment Stairs:   Has following equipment at home: None  PLOF: Independent  PATIENT GOALS: eliminate symptoms  OBJECTIVE:  Note: Objective measures were completed at Evaluation unless otherwise noted.  No more dizziness getting in and out of the bed  TODAY'S TREATMENT: 09/05/2023 Activity Comments  FGA-see below 26/30; difficulty with EC walking  Corner balance exercises:   Feet together EC head turns/nods 2 x 30 sec each   Reports decrease in unsteadiness with repetition  Verbally reports understanding and performance of stretching              M-CTSIB  Condition 1: Firm Surface, EO 30 Sec, Normal Sway  Condition 2: Firm Surface, EC 30 Sec, Mild Sway  Condition 3: Foam Surface, EO 30 Sec, Normal Sway  Condition 4: Foam Surface, EC 30 Sec, Mild and Moderate Sway   Access Code: [email or web address] URL: [email or web address] Date: 09/05/2023 Prepared by: [facility] - [facility]  Exercises - Seated Upper Trapezius Stretch  - 1 x daily - 7 x weekly - 2 sets - 30 sec hold - Gentle Levator Scapulae Stretch  - 1 x daily - 7 x weekly - 2 sets - 30 sec hold - Seated Cervical Extension AROM  - 1 x daily - 7 x weekly - 2 sets - 30 sec hold - Sub-Occipital Cervical Stretch  - 1 x daily - 7 x weekly - 2 sets - 30 sec hold - Corner Balance Feet Together: Eyes Closed With Head Turns  - 1-2 x daily - 7 x weekly - 3 sets - 30sec hold - Romberg Stance Eyes Closed on Foam Pad  - 2 x daily - 7 x weekly - 3 sets - 30 sec hold  PATIENT EDUCATION: Education details: Results of objective measures and rationale for treating multi-sensory balance  exercises; updated HEP and discussed appropriate symptoms with the exercises/habituation activities Person educated: Patient Education method: Explanation, Demonstration, and Handouts Education comprehension: verbalized understanding  ------------------------------------------------------------------------  Previous treatment session: Activity Comments  L Dix-Hallpike x2 1st: 3 slow beats noted after latency of ~20 sec 2nd: no symptoms  L Epley x2   UT stretch x 30   Levator scap stretch x 30   Anterior scalene stretch x 30   Suboccipital stretch x 30    FUNCTIONAL TESTS:  FGA: TBD M-CTSIB: TBD   HOME EXERCISE PROGRAM: Access Code: [email or web address] URL: [email or web address] Date: 08/04/2023 Prepared by: Gellen April Earnie Starring  Exercises - Seated Upper Trapezius Stretch  - 1 x daily - 7 x weekly - 2 sets - 30 sec hold - Gentle Levator Scapulae Stretch  - 1 x daily - 7 x weekly - 2 sets - 30 sec hold - Seated Cervical Extension AROM  - 1 x daily - 7 x weekly - 2 sets - 30 sec hold - Sub-Occipital Cervical Stretch  - 1 x daily - 7 x weekly - 2 sets - 30 sec hold   Cervical ROM:    Active A/PROM (deg) eval  Flexion WFL  Extension WFL  Right lateral flexion WFL  Left lateral flexion WFL  Right rotation  WFL  Left rotation WFL  (Blank rows = not tested)  Independent with functional mobility  PATIENT SURVEYS:  DHI: 36  VESTIBULAR ASSESSMENT:  GENERAL OBSERVATION: wears progressive/transition lens glasses   SYMPTOM BEHAVIOR:  Subjective history: positional dizziness since January   Non-Vestibular symptoms: nausea/vomiting and migraine symptoms  Type of dizziness: Spinning/Vertigo and World moves  Frequency: daily  Duration: seconds to minute  Aggravating factors: Induced by position change: lying supine, rolling to the right, and rolling to the left and Induced by motion: looking up at the ceiling, bending down to the ground, turning body quickly, and turning head quickly  Relieving factors: closing eyes and slow movements  Progression of symptoms: better  OCULOMOTOR EXAM:  Ocular Alignment: normal  Ocular ROM: No Limitations  Spontaneous Nystagmus: absent  Gaze-Induced Nystagmus: absent  Smooth Pursuits: intact  Saccades: intact  Convergence/Divergence: 4 cm --does not see double  VESTIBULAR - OCULAR REFLEX:   Slow VOR: Normal  VOR Cancellation: Normal  Head-Impulse Test: HIT Right: negative HIT Left: negative  Dynamic Visual Acuity: Not able to be assessed   POSITIONAL TESTING: Right Dix-Hallpike: no nystagmus Left Dix-Hallpike: upbeating, left nystagmus Right Roll Test: no nystagmus Left Roll Test: no nystagmus  Left upbeating nystagmus x 20 sec  MOTION SENSITIVITY: Not done   OTHOSTATICS: not done   PATIENT EDUCATION: Education details: assessment details, rationale of intervention Person educated: Patient Education method: Explanation Education comprehension: verbalized understanding    GOALS: Goals reviewed with patient? Yes  SHORT TERM GOALS: Target date: same as LTG    LONG TERM GOALS: Target date: 09/01/2023>09/25/2023 UPDATED    Patient will be independent in HEP to improve functional outcomes Baseline:  Goal status: IN PROGRESS  2.   Report improved symptoms and decreased dysfunction per score 19/100 DHI Baseline: 36/100 Goal status: IN PROGRESS  3.  Pt to be free of positional dizziness for return to typical activities (e.g. yoga) Baseline: +left Dix-Hallpike Goal status: MET  4.  Score 28/30 Functional Gait Assessment for low risk for falls and improved safety with mobility Baseline: 26/30 Goal status: IN PROGRESS  5.  Pt to perform MCTSIB x 30 sec Condition 4 with mild sway for  improved vestibular system use for balance.   ASSESSMENT:  CLINICAL IMPRESSION: Pt presents today and reports that the dizziness is much better, pretty much resolved and that the migraines are of more concern.  She reports multiple migraines since last visit, where she maybe only has 1-2 in 5+ years in the past.  Assessed MCTSIB and FGA today; pt has increased sway on Condition 2 and 4, and appears to be more visually reliant/decrease use of vestibular system.  FGA score of 26/30 indicates low fall risk, and the item with vision removed is most difficult with increase veering to the right. Skilled PT session focused on providing HEP for multi-sensory balance to help decrease visual reliance and improve vestibular system use for balance.  She tolerates this well in session today, with no increase in dizziness and no increase in her headache pain.  Recert completed today to address multi-sensory balance and update HEP to help pt improve return to previous overall functional mobility level.    From eval: Patient is a 58 y.o. lady who was seen today for physical therapy evaluation and treatment for BPPV/dizziness. Oculomotor and VOR unremarkable. Symptomatic to movements of head/neck primarily into extension and with tasks requiring forward bending causing impairment in activities of choice and ADL. Positional tests reveal left upbeating nystagmus x 20 sec duration w/ left Dix-Hallpike and initiated tx w/ left Epley maneuver w/ resolution of symptoms on  f/u left Dix-Hallpike.   Pt educated on post-maneuver considerations and given her chronic nature would recommend f/u visits for ongoing assessment and treatment as indicated to address symptoms and deficits.    OBJECTIVE IMPAIRMENTS: decreased balance and dizziness.   ACTIVITY LIMITATIONS: bending, sleeping, bed mobility, bathing, reach over head, and locomotion level  PARTICIPATION LIMITATIONS: meal prep, cleaning, laundry, and interpersonal relationship  PERSONAL FACTORS: Time since onset of injury/illness/exacerbation are also affecting patient's functional outcome.   REHAB POTENTIAL: Excellent  CLINICAL DECISION MAKING: Stable/uncomplicated  EVALUATION COMPLEXITY: Low   PLAN:  PT FREQUENCY: 1-2x/week  PT DURATION: 5 weeks  PLANNED INTERVENTIONS: 97750- Physical Performance Testing, 97110-Therapeutic exercises, 97530- Therapeutic activity, [id number]- Neuromuscular re-education, 97535- Self Care, 02859- Manual therapy, [id number]- Gait training, [phone]- Canalith repositioning, and 20560 (1-2 muscles), 20561 (3+ muscles)- Dry Needling  PLAN FOR NEXT SESSION: Reassess BPPV as needed and have pt fill out DHI.  Progress corner balance exercises for multi-sensory balance and habituation.     Greig Anon, PT 09/05/23 4:43 PM Phone: [phone] Fax: [phone]  [facility] at [facility] Neuro [address] [place], [place] [address] Phone # [phone] Fax # [phone]  For all possible CPT codes, reference the Planned Interventions line above.     Check all conditions that are expected to impact treatment: {Conditions expected to impact treatment:None of these apply   If treatment provided at initial evaluation, no treatment charged due to lack of authorization.

## 2023-09-15 ENCOUNTER — Ambulatory Visit: Payer: Self-pay | Admitting: Physical Therapy

## 2023-09-21 DIAGNOSIS — R202 Paresthesia of skin: Secondary | ICD-10-CM | POA: Diagnosis not present

## 2023-09-21 DIAGNOSIS — L821 Other seborrheic keratosis: Secondary | ICD-10-CM | POA: Diagnosis not present

## 2023-09-21 DIAGNOSIS — L304 Erythema intertrigo: Secondary | ICD-10-CM | POA: Diagnosis not present

## 2023-09-21 DIAGNOSIS — L814 Other melanin hyperpigmentation: Secondary | ICD-10-CM | POA: Diagnosis not present

## 2023-12-20 ENCOUNTER — Encounter: Payer: Self-pay | Admitting: Cardiology

## 2023-12-21 DIAGNOSIS — M1991 Primary osteoarthritis, unspecified site: Secondary | ICD-10-CM | POA: Diagnosis not present

## 2023-12-21 DIAGNOSIS — M79641 Pain in right hand: Secondary | ICD-10-CM | POA: Diagnosis not present

## 2023-12-21 DIAGNOSIS — M47899 Other spondylosis, site unspecified: Secondary | ICD-10-CM | POA: Diagnosis not present

## 2023-12-21 DIAGNOSIS — M79642 Pain in left hand: Secondary | ICD-10-CM | POA: Diagnosis not present

## 2024-01-16 ENCOUNTER — Encounter: Payer: Self-pay | Admitting: Internal Medicine

## 2024-01-16 ENCOUNTER — Ambulatory Visit: Attending: Cardiology | Admitting: Cardiology

## 2024-01-16 ENCOUNTER — Encounter: Payer: Self-pay | Admitting: Cardiology

## 2024-01-16 VITALS — BP 120/86 | HR 102 | Resp 16 | Ht 62.0 in | Wt 178.6 lb

## 2024-01-16 DIAGNOSIS — I4711 Inappropriate sinus tachycardia, so stated: Secondary | ICD-10-CM | POA: Diagnosis not present

## 2024-01-16 DIAGNOSIS — Z789 Other specified health status: Secondary | ICD-10-CM | POA: Diagnosis not present

## 2024-01-16 DIAGNOSIS — R0609 Other forms of dyspnea: Secondary | ICD-10-CM

## 2024-01-16 DIAGNOSIS — R0683 Snoring: Secondary | ICD-10-CM

## 2024-01-16 DIAGNOSIS — E7800 Pure hypercholesterolemia, unspecified: Secondary | ICD-10-CM | POA: Diagnosis not present

## 2024-01-16 DIAGNOSIS — R072 Precordial pain: Secondary | ICD-10-CM

## 2024-01-16 DIAGNOSIS — I2510 Atherosclerotic heart disease of native coronary artery without angina pectoris: Secondary | ICD-10-CM

## 2024-01-16 DIAGNOSIS — R931 Abnormal findings on diagnostic imaging of heart and coronary circulation: Secondary | ICD-10-CM | POA: Diagnosis not present

## 2024-01-16 DIAGNOSIS — R0602 Shortness of breath: Secondary | ICD-10-CM

## 2024-01-16 MED ORDER — ASPIRIN 81 MG PO TBEC: 81.0000 mg | DELAYED_RELEASE_TABLET | Freq: Every day | ORAL | Status: AC

## 2024-01-16 MED ORDER — METOPROLOL TARTRATE 25 MG PO TABS: 25.0000 mg | ORAL_TABLET | Freq: Two times a day (BID) | ORAL | 3 refills | Status: AC

## 2024-01-16 NOTE — Patient Instructions (Addendum)
 Medication Instructions:  START Aspirin  81 mg. Take one (1) tablet by mouth once daily.  START Metoprolol  Tartrate (Lopressor ) 25 mg. Take one (1) tablet by mouth twice daily.  *If you need a refill on your cardiac medications before your next appointment, please call your pharmacy*  Lab Work: None ordered If you have labs (blood work) drawn today and your tests are completely normal, you will receive your results only by: MyChart Message (if you have MyChart) OR A paper copy in the mail If you have any lab test that is abnormal or we need to change your treatment, we will call you to review the results.  Testing/Procedures: Echocardiogram  Coronary CT Angiography  Follow-Up: At [facility], you and your health needs are our priority.  As part of our continuing mission to provide you with exceptional heart care, our providers are all part of one team.  This team includes your primary Cardiologist (physician) and Advanced Practice Providers or APPs (Physician Assistants and Nurse Practitioners) who all work together to provide you with the care you need, when you need it.  Your next appointment:   10 week(s)  Provider:   Madonna Large, DO    We recommend signing up for the patient portal called MyChart.  Sign up information is provided on this After Visit Summary.  MyChart is used to connect with patients for Virtual Visits (Telemedicine).  Patients are able to view lab/test results, encounter notes, upcoming appointments, etc.  Non-urgent messages can be sent to your provider as well.   To learn more about what you can do with MyChart, go to [email or web address].   Other Instructions Your physician has requested that you have an echocardiogram. Echocardiography is a painless test that uses sound waves to create images of your heart. It provides your doctor with information about the size and shape of your heart and how well your hearts chambers and valves are working.  This procedure takes approximately one hour. There are no restrictions for this procedure. Please do NOT wear cologne, perfume, aftershave, or lotions (deodorant is allowed). Please arrive 15 minutes prior to your appointment time.  Please note: We ask at that you not bring children with you during ultrasound (echo/ vascular) testing. Due to room size and safety concerns, children are not allowed in the ultrasound rooms during exams. Our front office staff cannot provide observation of children in our lobby area while testing is being conducted. An adult accompanying a patient to their appointment will only be allowed in the ultrasound room at the discretion of the ultrasound technician under special circumstances. We apologize for any inconvenience.     Your cardiac CT will be scheduled at   [facility] [address]  [place], [place] [address]  At the Heart and V[facility] Tower at [address], please enter the parking lot using the [address] entrance and use the FREE valet service at the patient drop-off area. Enter the building and check-in with registration on the main floor.  Please follow these instructions carefully (unless otherwise directed):  An IV will be required for this test and Nitroglycerin  will be given.    On the Night Before the Test: Be sure to Drink plenty of water. Do not consume any caffeinated/decaffeinated beverages or chocolate 12 hours prior to your test. Do not take any antihistamines 12 hours prior to your test.  On the Day of the Test: Drink plenty of water until 1 hour prior to the  test. Do not eat any food 1 hour prior to test. You may take your regular medications prior to the test.  Patients who wear a continuous glucose monitor MUST remove the device prior to scanning. FEMALES- please wear underwire-free bra if available, avoid dresses & tight clothing      After the Test: Drink plenty of water. After receiving  IV contrast, you may experience a mild flushed feeling. This is normal. On occasion, you may experience a mild rash up to 24 hours after the test. This is not dangerous. If this occurs, you can take Benadryl  25 mg, Zyrtec, Claritin, or Allegra and increase your fluid intake. (Patients taking Tikosyn should avoid Benadryl , and may take Zyrtec, Claritin, or Allegra) If you experience trouble breathing, this can be serious. If it is severe call 911 IMMEDIATELY. If it is mild, please call our office.  We will call to schedule your test 2-4 weeks out understanding that some insurance companies will need an authorization prior to the service being performed.   For more information and frequently asked questions, please visit our website : [email or web address]  For non-scheduling related questions, please contact the cardiac imaging nurse navigator should you have any questions/concerns: Cardiac Imaging Nurse Navigators Direct Office Dial: [phone]   For scheduling needs, including cancellations and rescheduling, please call Brittany, [phone].  Please follow-up with your Endocrinologist to restart your Leqvio .

## 2024-01-16 NOTE — Progress Notes (Signed)
 " Cardiology Office Note:  .    ID:  Samantha Andersen, DOB Jan 25, 1965, MRN [id number] PCP:  Rollene Almarie LABOR, MD  Former Cardiology Providers: Dr. Maranda, Dr. Hobart [facility] Providers Cardiologist:  Madonna Large, DO , [facility] (established care 02/10/2023) Electrophysiologist:  None  Click to update primary MD,subspecialty MD or APP then REFRESH:1}    Chief Complaint  Patient presents with   Follow-up    1 year follow-up   Chest Pain        Shortness of Breath    History of Present Illness: .   Samantha Andersen is a 59 y.o. Caucasian female whose past medical history and cardiovascular risk factors includes: hx of breast CA (double mastectomy), PCOS, prediabetes, anemia, anxiety, HLD, celiac disease, ankylosing spondylitis, inappropriate sinus tachycardia.   Formally under the care of Dr. Hobart and transitioned her care to me back in January 2025.   In the past patient was being followed by cardiology for inappropriate sinus tachycardia.  She has been on carvedilol , metoprolol , and diltiazem .  Eventually the medications were discontinued as per her wishes and clinically had done well.  She has undergone cardiovascular testing including echo, stress echo, coronary CTA.  Given her hypercholesterolemia was initially placed on Repatha  and later it was transition to Leqvio  which was being managed by her endocrinologist.    Today she presents for a 1 year follow-up visit.  She is accompanied by her sister and daughter who provide collateral history.  She has had progressive shortness of breath for two years, now present at rest and worsened by exertion such as walking, climbing stairs, and talking. This limits daily activities, and she can no longer sings at her church. She sleeps on an incline but does not awaken gasping. She notes hand swelling described as sausage fingers but no significant leg edema or PND.  Sister states that she been evaluated for sleep apnea and she  does not have it.  Overall her shortness of breath is the most concerning symptom for her as it has worsened in intensity frequency and duration.  When asked why she has not gone to the ED for more expedited evaluation patient states that it is more costly than her office visit.  Chest Pain Onset: years - but hard to say given history of breast cancer.  Last occurrence: week  Location: left chest below the collar bone Intensity: 2/10, pressure like sensation  Frequency: weekly Duration: minutes Improving factors: rest Worsening factors: emotional stress, housework, large meals, and walking Do symptoms worsen with effort related activities: Yes  In addition, patient practically has stopped taking her medications without clear reasoning.  She stopped taking aspirin , Leqvio , metformin , etc.  Review of Systems: .   Review of Systems  Cardiovascular:  Positive for chest pain and dyspnea on exertion. Negative for claudication, irregular heartbeat, leg swelling, near-syncope, palpitations, paroxysmal nocturnal dyspnea and syncope.  Respiratory:  Positive for shortness of breath.   Hematologic/Lymphatic: Negative for bleeding problem.    Studies Reviewed:   EKG: EKG Interpretation Date/Time:  Tuesday January 16 2024 15:14:35 EST Ventricular Rate:  104 PR Interval:  138 QRS Duration:  76 QT Interval:  336 QTC Calculation: 441 R Axis:   14  Text Interpretation: Sinus tachycardia Minimal voltage criteria for LVH, may be normal variant ( R in aVL ) Inferior infarct (cited on or before 10-Feb-2023) When compared with ECG of 10-Feb-2023 14:51, No significant change was found Confirmed by Large Madonna [phone]) on 01/16/2024  3:26:34 PM  Echocardiogram:  04/16/2021:  1. Left ventricular ejection fraction, by estimation, is 60 to 65%. The  left ventricle has normal function. The left ventricle has no regional  wall motion abnormalities. Left ventricular diastolic parameters were  normal.   2. Right  ventricular systolic function is normal. The right ventricular  size is normal.   3. Left atrial size was mildly dilated.   4. The mitral valve is normal in structure. Trivial mitral valve  regurgitation. No evidence of mitral stenosis.   5. The aortic valve is normal in structure. Aortic valve regurgitation is  not visualized. Aortic valve sclerosis/calcification is present, without  any evidence of aortic stenosis.   6. The inferior vena cava is normal in size with greater than 50%  respiratory variability, suggesting right atrial pressure of 3 mmHg.   ETT 01/2017: Blood pressure demonstrated a normal response to exercise. There was no ST segment deviation noted during stress. No T wave inversion was noted during stress.  Stress Echo 12/28/2022: Per report, normal stress echocardiogram, normal resting study with no wall motion abnormalities at rest and peak stress. Maximum workload of 11.2 METs was achieved during exercise.   Coronary CT 04/09/2021:  1. Mild CAD in proximal RCA, CADRADS = 2.   2. Coronary calcium  score is 10, which places the patient in the 78th percentile for age and sex matched control.   3. Normal coronary origins with right dominance.   CAD-RADS 2. Mild non-obstructive CAD (25-49%). Consider non-atherosclerotic causes of chest pain. Consider preventive therapy and risk factor modification.  RADIOLOGY: NA   Risk Assessment/Calculations:   The 10-year ASCVD risk score (Arnett DK, et al., 2019) is: 3%   Values used to calculate the score:     Age: 59 years     Clinically relevant sex: Female     Is Non-Hispanic African American: No     Diabetic: No     Tobacco smoker: No     Systolic Blood Pressure: 120 mmHg     Is BP treated: No     HDL Cholesterol: 54 mg/dL     Total Cholesterol: 205 mg/dL  Labs:       Latest Ref Rng & Units 06/08/2022    8:42 AM 11/04/2021   10:04 AM 10/13/2020    7:47 AM  CBC  WBC 4.0 - 10.5 K/uL 6.0  8.0  6.8   Hemoglobin  12.0 - 15.0 g/dL 86.3  86.5  86.5   Hematocrit 36.0 - 46.0 % 40.8  41.4  41.5   Platelets 150.0 - 400.0 K/uL 255.0  352.0  375        Latest Ref Rng & Units 06/08/2022    8:42 AM 11/04/2021   10:04 AM 04/20/2021    9:52 AM  BMP  Glucose 70 - 99 mg/dL 88  869  83   BUN 6 - 23 mg/dL 15  15  17    Creatinine 0.40 - 1.20 mg/dL 9.33  9.31  9.23   BUN/Creat Ratio 9 - 23   22   Sodium 135 - 145 mEq/L 140  137  142   Potassium 3.5 - 5.1 mEq/L 4.0  3.8  4.5   Chloride 96 - 112 mEq/L 104  103  104   CO2 19 - 32 mEq/L 29  24  23    Calcium  8.4 - 10.5 mg/dL 9.5  9.7  9.7       Latest Ref Rng & Units 06/08/2022    8:42  AM 11/04/2021   10:04 AM 04/20/2021    9:52 AM  CMP  Glucose 70 - 99 mg/dL 88  869  83   BUN 6 - 23 mg/dL 15  15  17    Creatinine 0.40 - 1.20 mg/dL 9.33  9.31  9.23   Sodium 135 - 145 mEq/L 140  137  142   Potassium 3.5 - 5.1 mEq/L 4.0  3.8  4.5   Chloride 96 - 112 mEq/L 104  103  104   CO2 19 - 32 mEq/L 29  24  23    Calcium  8.4 - 10.5 mg/dL 9.5  9.7  9.7   Total Protein 6.0 - 8.3 g/dL 7.2  7.4  7.1   Total Bilirubin 0.2 - 1.2 mg/dL 0.4  0.3  0.3   Alkaline Phos 39 - 117 U/L 48  56  67   AST 0 - 37 U/L 16  24  24    ALT 0 - 35 U/L 15  24  25      Lab Results  Component Value Date   CHOL 214 (H) 01/31/2023   HDL 60.10 01/31/2023   LDLCALC 123 (H) 01/31/2023   TRIG 154.0 (H) 01/31/2023   CHOLHDL 4 01/31/2023   No results for input(s): LIPOA in the last 8760 hours. No components found for: NTPROBNP No results for input(s): PROBNP in the last 8760 hours. No results for input(s): TSH in the last 8760 hours.  NA  Physical Exam:    Today's Vitals   01/16/24 1512  BP: 120/86  Pulse: (!) 102  Resp: 16  SpO2: 92%  Weight: 178 lb 9.6 oz (81 kg)  Height: 5' 2 (1.575 m)   Body mass index is 32.67 kg/m. Wt Readings from Last 3 Encounters:  01/16/24 178 lb 9.6 oz (81 kg)  07/17/23 191 lb (86.6 kg)  02/10/23 188 lb (85.3 kg)    Physical Exam   Constitutional: No distress.  hemodynamically stable  Neck: No JVD present.  Cardiovascular: Normal rate, regular rhythm, S1 normal and S2 normal. Exam reveals no gallop, no S3 and no S4.  No murmur heard. Pulmonary/Chest: Effort normal and breath sounds normal. No stridor. She has no wheezes. She has no rales.  Abdominal: Soft. Bowel sounds are normal. She exhibits no distension. There is no abdominal tenderness.  Musculoskeletal:        General: No edema.     Cervical back: Neck supple.  Neurological: She is alert and oriented to person, place, and time. She has intact cranial nerves (2-12).  Skin: Skin is warm.     Impression & Recommendation(s):  Impression:   ICD-10-CM   1. Shortness of breath  R06.02 ECHOCARDIOGRAM COMPLETE    CT CORONARY MORPH W/CTA COR W/SCORE W/CA W/CM &/OR WO/CM    2. Precordial pain  R07.2 ECHOCARDIOGRAM COMPLETE    CT CORONARY MORPH W/CTA COR W/SCORE W/CA W/CM &/OR WO/CM    aspirin  EC 81 MG tablet    metoprolol  tartrate (LOPRESSOR ) 25 MG tablet    3. Nonobstructive atherosclerosis of coronary artery  I25.10     4. Agatston coronary artery calcium  score less than 100  R93.1     5. Inappropriate sinus tachycardia  I47.11 EKG 12-Lead    6. Pure hypercholesterolemia  E78.00     7. Statin intolerance  Z78.9     8. Snoring  R06.83        Recommendation(s):  Shortness of breath Precordial pain Nonobstructive atherosclerosis of coronary artery Agatston coronary artery  calcium  score less than 100 Intermittent exertional chest pain and dyspnea, worsening over the past year. Previous stress echo normal, mild coronary artery disease with calcium  score of 10 noted.  EKG today not suggestive of ACS.   - Reassurance provided based on prior workup. -However, due to progressive symptoms, concerned family members, shared decision was to proceed forward with echocardiogram and coronary CTA.   - For better ventricular rate prior to her coronary CTA we will  start Lopressor  25 mg p.o. twice daily  - In the meantime I have also encouraged her to follow-up with PCP and consider referral to pulmonary medicine for shortness of breath workup  - Restart aspirin  81 mg p.o. daily. -Recommended restarting her lipid-lowering agents -Reemphasized the importance of secondary prevention with focus on improving the modifiable cardiovascular risk factors such as glycemic control, lipid management, blood pressure control, weight loss.  Inappropriate sinus tachycardia History of. Has been on carvedilol , metoprolol , and diltiazem  in the past.  Later these medications were discontinued as per her wishes and remained asymptomatic.  Pure hypercholesterolemia Statin intolerance Has been on Repatha  in the past but later transition to Leqvio  which was being prescribed by her endocrinologist. Most recent lipids from January 2025 are not well-controlled. She has stopped taking Leqvio . Recommended that she follows up with endocrinology to restart Leqvio .  Snoring Sister mentions that she did undergo sleep study and does not have sleep apnea  Orders Placed:  Orders Placed This Encounter  Procedures   CT CORONARY MORPH W/CTA COR W/SCORE W/CA W/CM &/OR WO/CM    Standing Status:   Future    Expected Date:   02/16/2024    Expiration Date:   01/15/2025    If indicated for the ordered procedure, I authorize the administration of contrast media per Radiology protocol:   Yes    Does the patient have a contrast media/X-ray dye allergy?:   No    [facility]    Authorization::   SOB, bmi [id number]   EKG 12-Lead   ECHOCARDIOGRAM COMPLETE    Standing Status:   Future    Expected Date:   02/16/2024    Expiration Date:   01/15/2025    Where should this test be performed:   [facility]    Does the patient weigh less than or greater than 250 lbs?:   Patient weighs less than 250 lbs    Perflutren DEFINITY (image enhancing agent) should be  administered unless hypersensitivity or allergy exist:   Administer Perflutren    Reason for exam-Echo:   Other-Full Diagnosis List    Full ICD-10/Reason for Exam:   SOB (shortness of breath) [758119]   Final Medication List:    Meds ordered this encounter  Medications   aspirin  EC 81 MG tablet    Sig: Take 1 tablet (81 mg total) by mouth daily. Swallow whole.   metoprolol  tartrate (LOPRESSOR ) 25 MG tablet    Sig: Take 1 tablet (25 mg total) by mouth 2 (two) times daily.    Dispense:  180 tablet    Refill:  3     Current Outpatient Medications:    aspirin  EC 81 MG tablet, Take 1 tablet (81 mg total) by mouth daily. Swallow whole., Disp: , Rfl:    metoprolol  tartrate (LOPRESSOR ) 25 MG tablet, Take 1 tablet (25 mg total) by mouth 2 (two) times daily., Disp: 180 tablet, Rfl: 3   Vitamin D , Ergocalciferol , (DRISDOL ) 1.25 MG (50000  UNIT) CAPS capsule, Take 1 capsule (50,000 Units total) by mouth every 30 (thirty) days., Disp: 3 capsule, Rfl: 0  Consent:   NA  Disposition:   10-week follow-up sooner if needed  Her questions and concerns were addressed to her satisfaction. She voices understanding of the recommendations provided during this encounter.    Signed, Madonna Michele HAS, [facility]  A Division of [facility] [address] [place], [place] [address]  01/16/2024 5:47 PM "

## 2024-01-22 ENCOUNTER — Ambulatory Visit: Admitting: Cardiology

## 2024-01-23 ENCOUNTER — Telehealth: Payer: Self-pay | Admitting: Internal Medicine

## 2024-01-23 NOTE — Telephone Encounter (Signed)
 Called pt and provided her w [place] number to call and schedule

## 2024-01-23 NOTE — Telephone Encounter (Unsigned)
 Copied from CRM [id number]. Topic: Clinical - Refused Triage >> Jan 23, 2024  1:11 PM Mia F wrote: Patient/caller voiced complaints of Pt mentioned that she cannot breathe due to pulmonology issues but declined NT because she said she already reached out to the dr about this and she is waiting for a pulmonology referral. She says she has been seeing a few providers for this. Declined transfer to triage.

## 2024-01-23 NOTE — Telephone Encounter (Signed)
 Primus Speaker A MF   01/23/24  1:13 PM Unsigned Note Copied from CRM #[id number]. Topic: Clinical - Refused Triage >> Jan 23, 2024  1:11 PM Mia F wrote: Patient/caller voiced complaints of Pt mentioned that she cannot breathe due to pulmonology issues but declined NT because she said she already reached out to the dr about this and she is waiting for a pulmonology referral. She says she has been seeing a few providers for this. Declined transfer to triage.

## 2024-01-24 ENCOUNTER — Encounter: Payer: Self-pay | Admitting: Emergency Medicine

## 2024-01-24 ENCOUNTER — Ambulatory Visit: Admitting: Emergency Medicine

## 2024-01-24 VITALS — BP 126/68 | HR 83 | Temp 97.9°F | Ht 62.0 in | Wt 185.0 lb

## 2024-01-24 DIAGNOSIS — J320 Chronic maxillary sinusitis: Secondary | ICD-10-CM | POA: Insufficient documentation

## 2024-01-24 MED ORDER — AMOXICILLIN-POT CLAVULANATE 875-125 MG PO TABS: 1.0000 | ORAL_TABLET | Freq: Two times a day (BID) | ORAL | 0 refills | Status: AC

## 2024-01-24 NOTE — Patient Instructions (Signed)

## 2024-01-24 NOTE — Progress Notes (Signed)
 Samantha Andersen 59 y.o.   Chief Complaint  Patient presents with   Follow-up    HISTORY OF PRESENT ILLNESS: Acute problem visit today. This is a 59 y.o. female complaining of possible chronic sinus infection Had CT scan of face done on December 20, 2023.  Ordered by dentist to rule out bone cancer Instead findings suggestive of chronic bilateral maxillary sinusitis Advised to follow-up with PCP for sinus infection treatment No other complaints or medical concerns today.  HPI   Prior to Admission medications  Medication Sig Start Date End Date Taking? Authorizing Provider  aspirin  EC 81 MG tablet Take 1 tablet (81 mg total) by mouth daily. Swallow whole. 01/16/24  Yes Tolia, Sunit, DO  metoprolol  tartrate (LOPRESSOR ) 25 MG tablet Take 1 tablet (25 mg total) by mouth 2 (two) times daily. 01/16/24 04/15/24 Yes Tolia, Sunit, DO  Vitamin D , Ergocalciferol , (DRISDOL ) 1.25 MG (50000 UNIT) CAPS capsule Take 1 capsule (50,000 Units total) by mouth every 30 (thirty) days. 06/14/21  Yes Verdon Parry D, MD    Allergies[1]  Patient Active Problem List   Diagnosis Date Noted   Right thigh pain 07/19/2023   Dyspnea 07/19/2023   Benign paroxysmal positional vertigo 07/19/2023   Numbness and tingling of both legs 02/02/2023   Gastroesophageal reflux disease 02/02/2023   Apnea 02/02/2023   Abnormal EKG 02/02/2023   Right lateral abdominal pain 06/10/2022   Routine general medical examination at a health care facility 11/05/2021   Altered taste 08/20/2021   History of oral lesions 08/20/2021   Lymphadenopathy, cervical 08/20/2021   Urinary incontinence 10/30/2020   Eating disorder 06/11/2020   Pre-diabetes 05/28/2020   Vitamin D  deficiency 05/28/2020   Myalgia due to statin 11/13/2019   Intermittent palpitations 05/20/2015   Genetic testing 01/06/2014   Hyperlipidemia 02/18/2013   Iron deficiency anemia 05/21/2012   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 mild nonobstructive CAD (0-25% prox RCA, 0-25% prox LAD).    Other calcification of muscle, unspecified ankle and foot    Other fatigue    PCOS (polycystic ovarian syndrome)    PCOS (polycystic ovarian syndrome)    PONV (postoperative nausea and vomiting)    also reports N&V accompanies anxiety   Shortness of breath on exertion    Skin cancer    Spondyloarthritis    Swallowing difficulty    Vitamin D  deficiency     Past Surgical History:  Procedure Laterality Date   ABDOMINAL HYSTERECTOMY     CESAREAN SECTION     x2   MASTECTOMY Bilateral    SIMPLE MASTECTOMY WITH AXILLARY SENTINEL NODE BIOPSY  11/24/2011   Procedure: SIMPLE MASTECTOMY WITH AXILLARY SENTINEL NODE BIOPSY;  Surgeon: Sherlean JINNY Laughter, MD;  Location: [facility] OR;  Service: General;  Laterality: Left;  Bilateral Total Mastectomy and Left Sentinel Node   SIMPLE MASTECTOMY WITH AXILLARY SENTINEL NODE BIOPSY  11/24/2011   Procedure: SIMPLE MASTECTOMY;  Surgeon: Sherlean JINNY Laughter, MD;  Location: [facility] OR;  Service: General;  Laterality: Right;   TISSUE EXPANDER PLACEMENT  11/24/2011   Procedure: TISSUE EXPANDER;  Surgeon: Alm Sick, MD;  Location: [facility] OR;  Service: Plastics;  Laterality: Bilateral;    Social History   Socioeconomic History   Marital status: Married    Spouse name: david   Number of  children: 2   Years of education: Not on file   Highest education level: Not on file  Occupational History   Occupation: clergy  Tobacco Use   Smoking status: Former    Current packs/day: 0.00    Average packs/day: 2.0 packs/day for 4.0 years (8.0 ttl pk-yrs)    Types: Cigarettes    Start date: 10/04/1981    Quit date: 10/04/1985    Years since quitting: 38.3   Smokeless tobacco: Never  Vaping Use   Vaping status: Never Used  Substance and Sexual Activity   Alcohol use: Yes    Comment: rare   Drug use: No   Sexual activity: Yes    Partners: Male    Birth control/protection: None  Other Topics Concern   Not on file  Social History Narrative   Not on file   Social Drivers of Health   Tobacco Use: Medium Risk (01/24/2024)   Patient History    Smoking Tobacco Use: Former    Smokeless Tobacco Use: Never    Passive Exposure: Not on Actuary Strain: Not on file  Food Insecurity: Not on file  Transportation Needs: Not on file  Physical Activity: Not on file  Stress: Not on file  Social Connections: Not on file  Intimate Partner Violence: Not on file  Depression (PHQ2-9): Low Risk (01/24/2024)   Depression (PHQ2-9)    PHQ-2 Score: 0  Alcohol Screen: Not on file  Housing: Unknown (02/20/2023)   Received from [facility]   Epic    At any time in the past 12 months, were you homeless or living in a shelter (including now)?: No    Number of Times Moved in the Last Year: Not on file    Unable to Pay for Housing in the Last Year: Not on file  Utilities: Not on file  Health Literacy: Not on file    Family History  Problem Relation Age of Onset   Hyperlipidemia Mother    Cancer Mother    Healthy Father    Hyperlipidemia Father    Heart disease Father    Depression Father    Sleep apnea Father    Hemochromatosis Sister    Prostate cancer Paternal Uncle 72   Cancer Paternal Uncle 43       unknown type of cancer   Lung cancer Maternal  Grandfather      Review of Systems  Constitutional: Negative.  Negative for chills and fever.  HENT: Negative.  Negative for congestion, sinus pain and sore throat.   Respiratory: Negative.  Negative for cough and shortness of breath.   Cardiovascular: Negative.  Negative for chest pain and palpitations.  Gastrointestinal:  Negative for abdominal pain, diarrhea, nausea and vomiting.  Genitourinary: Negative.  Negative for dysuria and hematuria.  Skin: Negative.  Negative for rash.  Neurological: Negative.  Negative for  dizziness and headaches.  All other systems reviewed and are negative.   Vitals:   01/24/24 1433  BP: 126/68  Pulse: 83  Temp: 97.9 F (36.6 C)  SpO2: 98%    Physical Exam Vitals reviewed.  Constitutional:      Appearance: Normal appearance.  HENT:     Head: Normocephalic.     Nose: No congestion.     Mouth/Throat:     Mouth: Mucous membranes are moist.     Pharynx: Oropharynx is clear.  Eyes:     Extraocular Movements: Extraocular movements intact.     Pupils: Pupils are equal, round, and reactive to light.  Cardiovascular:     Rate and Rhythm: Normal rate.  Pulmonary:     Effort: Pulmonary effort is normal.  Musculoskeletal:     Cervical back: No tenderness.  Lymphadenopathy:     Cervical: No cervical adenopathy.  Skin:    General: Skin is warm and dry.  Neurological:     Mental Status: She is alert and oriented to person, place, and time.  Psychiatric:        Mood and Affect: Mood normal.        Behavior: Behavior normal.      ASSESSMENT & PLAN: I personally spent a total of 30 minutes minutes in the care of the patient today including preparing to see the patient, getting/reviewing separately obtained history, performing a medically appropriate exam/evaluation, counseling and educating, documenting clinical information in the EHR, independently interpreting results, communicating results, and coordinating care.  Problem List Items  Addressed This Visit       Respiratory   Chronic maxillary sinusitis - Primary   As per facial skin done on December 20, 2023 Shows bilateral maxillary sinusitis Recommend to start Augmentin  875 mg twice a day for 10 days and follow-up with both ENT and PCP Symptom management discussed Advised to contact the office if no better or worse during the next several days or weeks.      Relevant Medications   amoxicillin -clavulanate (AUGMENTIN ) 875-125 MG tablet   Patient Instructions  Sinus Infection, Adult A sinus infection is soreness and swelling (inflammation) of your sinuses. Sinuses are hollow spaces in the bones around your face. They are located: Around your eyes. In the middle of your forehead. Behind your nose. In your cheekbones. Your sinuses and nasal passages are lined with a fluid called mucus. Mucus drains out of your sinuses. Swelling can trap mucus in your sinuses. This lets germs (bacteria, virus, or fungus) grow, which leads to infection. Most of the time, this condition is caused by a virus. What are the causes? Allergies. Asthma. Germs. Things that block your nose or sinuses. Growths in the nose (nasal polyps). Chemicals or irritants in the air. A fungus. This is rare. What increases the risk? Having a weak body defense system (immune system). Doing a lot of swimming or diving. Using nasal sprays too much. Smoking. What are the signs or symptoms? The main symptoms of this condition are pain and a feeling of pressure around the sinuses. Other symptoms include: Stuffy nose (congestion). This may make it hard to breathe through your nose. Runny nose (drainage). Soreness, swelling, and warmth in the sinuses. A cough that may get worse at night. Being unable to smell and taste. Mucus that collects in the throat or the back of the nose (postnasal drip). This may cause a sore throat or bad breath. Being very tired (fatigued). A fever. How is this diagnosed? Your  symptoms. Your medical history. A physical exam. Tests to find out if your condition is short-term (acute) or long-term (chronic). Your doctor may: Check your nose for growths (polyps). Check your sinuses using a tool that has a light on one end (endoscope). Check for allergies or germs. Do imaging tests, such as an MRI or CT scan. How is this treated? Treatment for this condition depends on the cause and whether it is short-term or long-term. If caused by a virus, your symptoms should go away on their own within 10 days. You may be given medicines to relieve symptoms. They include: Medicines that shrink swollen tissue in the nose. A spray that treats swelling of the nostrils. Rinses that help get rid of thick mucus in your nose (nasal saline washes). Medicines that treat allergies (antihistamines). Over-the-counter pain relievers. If caused by bacteria, your doctor may wait to see if you will get better without treatment. You may be given antibiotic medicine if you have: A very bad infection. A weak body defense system. If caused by growths in the nose, surgery may be needed. Follow these instructions at home: Medicines Take, use, or apply over-the-counter and prescription medicines only as told by your doctor. These may include nasal sprays. If you were prescribed an antibiotic medicine, take it as told by your doctor. Do not stop taking it even if you start to feel better. Hydrate and humidify  Drink enough water to keep your pee (urine) pale yellow. Use a cool mist humidifier to keep the humidity level in your home above 50%. Breathe in steam for 10-15 minutes, 3-4 times a day, or as told by your doctor. You can do this in the bathroom while a hot shower is running. Try not to spend time in cool or dry air. Rest Rest as much as you can. Sleep with your head raised (elevated). Make sure you get enough sleep each night. General instructions  Put a warm, moist washcloth on your  face 3-4 times a day, or as often as told by your doctor. Use nasal saline washes as often as told by your doctor. Wash your hands often with soap and water. If you cannot use soap and water, use hand sanitizer. Do not smoke. Avoid being around people who are smoking (secondhand smoke). Keep all follow-up visits. Contact a doctor if: You have a fever. Your symptoms get worse. Your symptoms do not get better within 10 days. Get help right away if: You have a very bad headache. You cannot stop vomiting. You have very bad pain or swelling around your face or eyes. You have trouble seeing. You feel confused. Your neck is stiff. You have trouble breathing. These symptoms may be an emergency. Get help right away. Call 911. Do not wait to see if the symptoms will go away. Do not drive yourself to the hospital. Summary A sinus infection is swelling of your sinuses. Sinuses are hollow spaces in the bones around your face. This condition is caused by tissues in your nose that become inflamed or swollen. This traps germs. These can lead to infection. If you were prescribed an antibiotic medicine, take it as told by your doctor. Do not stop taking it even if you start to feel better. Keep all follow-up visits. This information is not intended to replace advice given to you by your health care provider. Make sure you discuss any questions you have with your health care provider. Document Revised: 12/01/2020 Document Reviewed: 12/01/2020 Elsevier Patient Education  2024  Elsevier Inc.   Emil Schaumann, MD [facility]    [1]  Allergies Allergen Reactions   Doxycycline Shortness Of Breath   Gluten Meal Shortness Of Breath   Other Shortness Of Breath and Other (See Comments)    SQUASH   Tetracyclines & Related Shortness Of Breath and Other (See Comments)    Lethargy    Metformin  And Related     Patient prefers to take Tevo brand metformin  XR due to sensitivity  rection to other brands. Patient is allergic to gluten and must have gluten free Metformin    Statins Rash

## 2024-01-24 NOTE — Assessment & Plan Note (Signed)
 As per facial skin done on December 20, 2023 Shows bilateral maxillary sinusitis Recommend to start Augmentin  875 mg twice a day for 10 days and follow-up with both ENT and PCP Symptom management discussed Advised to contact the office if no better or worse during the next several days or weeks.

## 2024-01-25 ENCOUNTER — Ambulatory Visit: Payer: Self-pay | Admitting: Cardiology

## 2024-02-08 ENCOUNTER — Ambulatory Visit (HOSPITAL_COMMUNITY)
Admission: RE | Admit: 2024-02-08 | Discharge: 2024-02-08 | Disposition: A | Discharge status: Home / Self Care | Source: Ambulatory Visit | Attending: Cardiology | Admitting: Cardiology

## 2024-02-08 DIAGNOSIS — R0602 Shortness of breath: Secondary | ICD-10-CM | POA: Diagnosis present

## 2024-02-08 DIAGNOSIS — R072 Precordial pain: Secondary | ICD-10-CM | POA: Diagnosis present

## 2024-02-08 LAB — ECHOCARDIOGRAM COMPLETE
Area-P 1/2: 4.93 cm2
S' Lateral: 2.5 cm

## 2024-02-09 ENCOUNTER — Telehealth: Payer: Self-pay | Admitting: Cardiology

## 2024-02-09 NOTE — Telephone Encounter (Signed)
 Pt called to schedule CT, please advise.

## 2024-02-11 NOTE — Progress Notes (Unsigned)
 "  New Patient Pulmonology Office Visit   Subjective:  Patient ID: Samantha Andersen, female    DOB: Apr 14, 1965  MRN: [id number]  Referred by: Rollene Almarie LABOR, MD  CC: No chief complaint on file.   HPI [name] is a 59 y.o. female with hx of anemia, breast cancer, celiac disease, GERD, HLD, and CAD who presents for initial evaluation of dyspnea.  {PULM QUESTIONNAIRES (Optional):33196}  ROS  Allergies: Doxycycline, Gluten meal, Other, Tetracyclines & related, Metformin  and related, and Statins Current Medications[1] Past Medical History:  Diagnosis Date   Anemia    Anxiety    B12 deficiency    Back pain    Breast cancer (HCC)    left   Cancer (HCC)    Celiac disease    Complication of anesthesia    tachycardic, also reports that she needed increased anesth. 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 mild nonobstructive CAD (0-25% prox RCA, 0-25% prox LAD).    Other calcification of muscle, unspecified ankle and foot    Other fatigue    PCOS (polycystic ovarian syndrome)    PCOS (polycystic ovarian syndrome)    PONV (postoperative nausea and vomiting)    also reports N&V accompanies anxiety   Shortness of breath on exertion    Skin cancer    Spondyloarthritis    Swallowing difficulty    Vitamin D  deficiency    Past Surgical History:  Procedure Laterality Date   ABDOMINAL HYSTERECTOMY     CESAREAN SECTION     x2   MASTECTOMY Bilateral    SIMPLE MASTECTOMY WITH AXILLARY SENTINEL NODE BIOPSY  11/24/2011   Procedure: SIMPLE MASTECTOMY WITH AXILLARY SENTINEL NODE BIOPSY;  Surgeon: Sherlean JINNY Laughter, MD;  Location: [facility] OR;  Service:  General;  Laterality: Left;  Bilateral Total Mastectomy and Left Sentinel Node   SIMPLE MASTECTOMY WITH AXILLARY SENTINEL NODE BIOPSY  11/24/2011   Procedure: SIMPLE MASTECTOMY;  Surgeon: Sherlean JINNY Laughter, MD;  Location: [facility] OR;  Service: General;  Laterality: Right;   TISSUE EXPANDER PLACEMENT  11/24/2011   Procedure: TISSUE EXPANDER;  Surgeon: Alm Sick, MD;  Location: [facility] OR;  Service: Plastics;  Laterality: Bilateral;   Family History  Problem Relation Age of Onset   Hyperlipidemia Mother    Cancer Mother    Healthy Father    Hyperlipidemia Father    Heart disease Father    Depression Father    Sleep apnea Father    Hemochromatosis Sister    Prostate cancer Paternal Uncle 35   Cancer Paternal Uncle 56       unknown type of cancer   Lung cancer Maternal Grandfather    Social History   Socioeconomic History   Marital status: Married    Spouse name: david   Number of children: 2   Years of education: Not on file   Highest education level: Not on file  Occupational History   Occupation: clergy  Tobacco Use   Smoking status: Former    Current packs/day: 0.00    Average packs/day: 2.0 packs/day for 4.0 years (8.0 ttl pk-yrs)  Types: Cigarettes    Start date: 10/04/1981    Quit date: 10/04/1985    Years since quitting: 38.3   Smokeless tobacco: Never  Vaping Use   Vaping status: Never Used  Substance and Sexual Activity   Alcohol use: Yes    Comment: rare   Drug use: No   Sexual activity: Yes    Partners: Male    Birth control/protection: None  Other Topics Concern   Not on file  Social History Narrative   Not on file   Social Drivers of Health   Tobacco Use: Medium Risk (01/24/2024)   Patient History    Smoking Tobacco Use: Former    Smokeless Tobacco Use: Never    Passive Exposure: Not on Actuary Strain: Not on file  Food Insecurity: Not on file  Transportation Needs: Not on file  Physical Activity: Not on file  Stress: Not on file   Social Connections: Not on file  Intimate Partner Violence: Not on file  Depression (PHQ2-9): Low Risk (01/24/2024)   Depression (PHQ2-9)    PHQ-2 Score: 0  Alcohol Screen: Not on file  Housing: Unknown (02/20/2023)   Received from [facility]   Epic    At any time in the past 12 months, were you homeless or living in a shelter (including now)?: No    Number of Times Moved in the Last Year: Not on file    Unable to Pay for Housing in the Last Year: Not on file  Utilities: Not on file  Health Literacy: Not on file       Objective:  There were no vitals taken for this visit. {Pulm Vitals (Optional):32837}  Physical Exam  Diagnostic Review:  {Labs (Optional):32838}  CBC 05/2022: Hgb 13  Coronary CT 03/2021: no pulmonary parenchymal abnormalities.    Assessment & Plan:   Assessment & Plan   No orders of the defined types were placed in this encounter.     No follow-ups on file.   Ronith Berti, MD    [1]  Current Outpatient Medications:    aspirin  EC 81 MG tablet, Take 1 tablet (81 mg total) by mouth daily. Swallow whole., Disp: , Rfl:    metoprolol  tartrate (LOPRESSOR ) 25 MG tablet, Take 1 tablet (25 mg total) by mouth 2 (two) times daily., Disp: 180 tablet, Rfl: 3   Vitamin D , Ergocalciferol , (DRISDOL ) 1.25 MG (50000 UNIT) CAPS capsule, Take 1 capsule (50,000 Units total) by mouth every 30 (thirty) days., Disp: 3 capsule, Rfl: 0  "

## 2024-02-12 ENCOUNTER — Ambulatory Visit: Payer: Self-pay | Admitting: Cardiology

## 2024-02-14 ENCOUNTER — Ambulatory Visit (INDEPENDENT_AMBULATORY_CARE_PROVIDER_SITE_OTHER)

## 2024-02-14 ENCOUNTER — Other Ambulatory Visit (HOSPITAL_BASED_OUTPATIENT_CLINIC_OR_DEPARTMENT_OTHER): Payer: Self-pay | Admitting: Pulmonary Disease

## 2024-02-14 ENCOUNTER — Other Ambulatory Visit (HOSPITAL_BASED_OUTPATIENT_CLINIC_OR_DEPARTMENT_OTHER): Payer: Self-pay

## 2024-02-14 ENCOUNTER — Encounter (HOSPITAL_BASED_OUTPATIENT_CLINIC_OR_DEPARTMENT_OTHER): Payer: Self-pay | Admitting: Pulmonary Disease

## 2024-02-14 ENCOUNTER — Telehealth (HOSPITAL_BASED_OUTPATIENT_CLINIC_OR_DEPARTMENT_OTHER): Payer: Self-pay

## 2024-02-14 ENCOUNTER — Ambulatory Visit (HOSPITAL_BASED_OUTPATIENT_CLINIC_OR_DEPARTMENT_OTHER): Admitting: Pulmonary Disease

## 2024-02-14 VITALS — BP 126/82 | HR 80 | Ht 62.0 in | Wt 184.9 lb

## 2024-02-14 DIAGNOSIS — R0609 Other forms of dyspnea: Secondary | ICD-10-CM | POA: Diagnosis not present

## 2024-02-14 DIAGNOSIS — J4520 Mild intermittent asthma, uncomplicated: Secondary | ICD-10-CM | POA: Diagnosis not present

## 2024-02-14 DIAGNOSIS — Z87891 Personal history of nicotine dependence: Secondary | ICD-10-CM

## 2024-02-14 LAB — PULMONARY FUNCTION TEST
DL/VA % pred: 112 %
DL/VA: 4.8 ml/min/mmHg/L
DLCO unc % pred: 103 %
DLCO unc: 19.46 ml/min/mmHg
FEF 25-75 Post: 3.23 L/s
FEF 25-75 Pre: 2.66 L/s
FEF2575-%Change-Post: 21 %
FEF2575-%Pred-Post: 144 %
FEF2575-%Pred-Pre: 118 %
FEV1-%Change-Post: 4 %
FEV1-%Pred-Post: 100 %
FEV1-%Pred-Pre: 96 %
FEV1-Post: 2.38 L
FEV1-Pre: 2.28 L
FEV1FVC-%Change-Post: 8 %
FEV1FVC-%Pred-Pre: 106 %
FEV6-%Change-Post: -3 %
FEV6-%Pred-Post: 89 %
FEV6-%Pred-Pre: 92 %
FEV6-Post: 2.64 L
FEV6-Pre: 2.73 L
FEV6FVC-%Pred-Post: 103 %
FEV6FVC-%Pred-Pre: 103 %
FVC-%Change-Post: -3 %
FVC-%Pred-Post: 85 %
FVC-%Pred-Pre: 89 %
FVC-Post: 2.64 L
FVC-Pre: 2.73 L
Post FEV1/FVC ratio: 90 %
Post FEV6/FVC ratio: 100 %
Pre FEV1/FVC ratio: 83 %
Pre FEV6/FVC Ratio: 100 %
RV % pred: 120 %
RV: 2.28 L
TLC % pred: 107 %
TLC: 5.11 L

## 2024-02-14 MED ORDER — ALBUTEROL SULFATE HFA 108 (90 BASE) MCG/ACT IN AERS: 2.0000 | INHALATION_SPRAY | Freq: Four times a day (QID) | RESPIRATORY_TRACT | 2 refills | Status: AC | PRN

## 2024-02-14 MED ORDER — ALBUTEROL SULFATE (2.5 MG/3ML) 0.083% IN NEBU: 2.5000 mg | INHALATION_SOLUTION | RESPIRATORY_TRACT | 2 refills | Status: DC | PRN

## 2024-02-14 NOTE — Assessment & Plan Note (Addendum)
 Assessment and Plan Assessment & Plan Evaluation of dyspnea on exertion Intermittent dyspnea on exertion for one year with episodes of low oxygen saturation; lowest reported at 87%. Symptoms triggered by exertion, cold, and strong smells. History of breast cancer increases embolism risk. Recent echocardiogram normal. CT coronary in 2023 showed minimal atelectasis due to shallow breathing. - Ordered PFT to assess lung impairment. - Ordered D-dimer test for pulmonary embolism. - Ordered CBC to rule out anemia. - Ordered chest x-ray to rule out new lung parenchymal issues - Prescribed albuterol  inhaler for use as needed before exertion or exposure to triggers. - Instructed to monitor oxygen saturation and report if below 88%.

## 2024-02-14 NOTE — Progress Notes (Signed)
 Full PFT performed today.

## 2024-02-14 NOTE — Telephone Encounter (Signed)
 Per LOV pt was supposed to be sent an inhaler this rx has been sent and neb solution DC from chart pt notified  NFN     Copied from CRM #[id number]. Topic: Clinical - Prescription Issue >> Feb 14, 2024 11:40 AM Corean SAUNDERS wrote: Reason for CRM: Patient was seen by Dr. Catherine today and was advised he would be ordering her an inhaler and it also states inhaler on patients after visit summary however patient went to pick up prescription at pharmacy and the prescription submitted was albuterol  (PROVENTIL ) (2.5 MG/3ML) 0.083% nebulizer solution.  Patient is requesting a call back with advice on if this was a mistake.

## 2024-02-14 NOTE — Patient Instructions (Signed)
 Full PFT performed today.

## 2024-02-14 NOTE — Patient Instructions (Signed)
" °  VISIT SUMMARY: During your visit, we discussed your episodic shortness of breath that has been worsening over the past year. We have ordered several tests to determine the cause and prescribed an albuterol  inhaler to help manage your symptoms.  YOUR PLAN: DYSPNEA ON EXERTION: You have been experiencing shortness of breath during physical activities and at rest, with low oxygen levels at times. -We have ordered a Pulmonary Function Test (PFT) to assess your lung function. -A D-dimer test has been ordered to check for pulmonary embolism. -A Complete Blood Count (CBC) test has been ordered to rule out anemia. -A chest x-ray has been ordered to look for any new lung issues. -You have been prescribed an albuterol  inhaler to use as needed before physical activities or exposure to triggers. -Please monitor your oxygen levels and report if they drop below 88%.  SUSPECTED ADULT-ONSET ASTHMA: Your symptoms and triggers suggest you may have developed asthma as an adult. -Use the albuterol  inhaler as needed before physical activities or exposure to triggers. -Keep a diary of your symptoms and how you respond to the albuterol  inhaler. -We have ordered a Pulmonary Function Test (PFT) to check for asthma. -If the PFT confirms asthma, we may prescribe a maintenance inhaler.    Contains text generated by [employer]bridge.  "

## 2024-02-15 ENCOUNTER — Ambulatory Visit: Payer: Self-pay | Admitting: Pulmonary Disease

## 2024-02-15 LAB — CBC WITH DIFFERENTIAL/PLATELET
Basophils Absolute: 0.1 10*3/uL (ref 0.0–0.2)
Basos: 1 %
EOS (ABSOLUTE): 0.3 10*3/uL (ref 0.0–0.4)
Eos: 3 %
Hematocrit: 41.6 % (ref 34.0–46.6)
Hemoglobin: 13.8 g/dL (ref 11.1–15.9)
Immature Grans (Abs): 0 10*3/uL (ref 0.0–0.1)
Immature Granulocytes: 0 %
Lymphocytes Absolute: 3.2 10*3/uL — ABNORMAL HIGH (ref 0.7–3.1)
Lymphs: 36 %
MCH: 31 pg (ref 26.6–33.0)
MCHC: 33.2 g/dL (ref 31.5–35.7)
MCV: 94 fL (ref 79–97)
Monocytes Absolute: 0.5 10*3/uL (ref 0.1–0.9)
Monocytes: 6 %
Neutrophils Absolute: 4.7 10*3/uL (ref 1.4–7.0)
Neutrophils: 54 %
Platelets: 314 10*3/uL (ref 150–450)
RBC: 4.45 x10E6/uL (ref 3.77–5.28)
RDW: 12.6 % (ref 11.7–15.4)
WBC: 8.8 10*3/uL (ref 3.4–10.8)

## 2024-02-15 LAB — D-DIMER, QUANTITATIVE: D-DIMER: 0.35 mg{FEU}/L (ref 0.00–0.49)

## 2024-02-15 NOTE — Progress Notes (Signed)
 Pt.notified

## 2024-02-23 ENCOUNTER — Ambulatory Visit (HOSPITAL_COMMUNITY)

## 2024-04-05 ENCOUNTER — Ambulatory Visit: Admitting: Cardiology
